# Patient Record
Sex: Male | Born: 1960
Health system: Southern US, Community
[De-identification: ages and names within clinical notes are randomized; demographics above are authoritative.]

## PROBLEM LIST (undated history)

## (undated) DIAGNOSIS — K219 Gastro-esophageal reflux disease without esophagitis: Secondary | ICD-10-CM

## (undated) DIAGNOSIS — Z889 Allergy status to unspecified drugs, medicaments and biological substances status: Secondary | ICD-10-CM

## (undated) DIAGNOSIS — Z8709 Personal history of other diseases of the respiratory system: Secondary | ICD-10-CM

## (undated) DIAGNOSIS — Z973 Presence of spectacles and contact lenses: Secondary | ICD-10-CM

## (undated) DIAGNOSIS — T7840XA Allergy, unspecified, initial encounter: Secondary | ICD-10-CM

## (undated) DIAGNOSIS — M1990 Unspecified osteoarthritis, unspecified site: Secondary | ICD-10-CM

## (undated) HISTORY — DX: Unspecified osteoarthritis, unspecified site: M19.90

## (undated) HISTORY — PX: COLONOSCOPY: SHX174

## (undated) HISTORY — DX: Allergy, unspecified, initial encounter: T78.40XA

---

## 2004-02-25 HISTORY — PX: INGUINAL HERNIA REPAIR: SUR1180

## 2004-03-08 ENCOUNTER — Ambulatory Visit (HOSPITAL_BASED_OUTPATIENT_CLINIC_OR_DEPARTMENT_OTHER): Admission: RE | Admit: 2004-03-08 | Discharge: 2004-03-08 | Payer: Self-pay | Admitting: General Surgery

## 2004-03-08 ENCOUNTER — Ambulatory Visit (HOSPITAL_COMMUNITY): Admission: RE | Admit: 2004-03-08 | Discharge: 2004-03-08 | Payer: Self-pay | Admitting: General Surgery

## 2011-06-26 ENCOUNTER — Encounter: Payer: Self-pay | Admitting: Family Medicine

## 2011-06-26 ENCOUNTER — Ambulatory Visit: Payer: Federal, State, Local not specified - PPO

## 2011-06-26 ENCOUNTER — Ambulatory Visit (INDEPENDENT_AMBULATORY_CARE_PROVIDER_SITE_OTHER): Payer: Federal, State, Local not specified - PPO | Admitting: Family Medicine

## 2011-06-26 VITALS — BP 122/83 | HR 69 | Temp 97.7°F | Resp 16 | Ht 69.5 in | Wt 173.6 lb

## 2011-06-26 DIAGNOSIS — M159 Polyosteoarthritis, unspecified: Secondary | ICD-10-CM | POA: Insufficient documentation

## 2011-06-26 DIAGNOSIS — M899 Disorder of bone, unspecified: Secondary | ICD-10-CM

## 2011-06-26 DIAGNOSIS — M898X9 Other specified disorders of bone, unspecified site: Secondary | ICD-10-CM

## 2011-06-26 DIAGNOSIS — Z0000 Encounter for general adult medical examination without abnormal findings: Secondary | ICD-10-CM

## 2011-06-26 DIAGNOSIS — Z800 Family history of malignant neoplasm of digestive organs: Secondary | ICD-10-CM | POA: Insufficient documentation

## 2011-06-26 DIAGNOSIS — Z23 Encounter for immunization: Secondary | ICD-10-CM

## 2011-06-26 DIAGNOSIS — Z13220 Encounter for screening for lipoid disorders: Secondary | ICD-10-CM

## 2011-06-26 DIAGNOSIS — J309 Allergic rhinitis, unspecified: Secondary | ICD-10-CM | POA: Insufficient documentation

## 2011-06-26 DIAGNOSIS — M949 Disorder of cartilage, unspecified: Secondary | ICD-10-CM

## 2011-06-26 DIAGNOSIS — Z8719 Personal history of other diseases of the digestive system: Secondary | ICD-10-CM | POA: Insufficient documentation

## 2011-06-26 DIAGNOSIS — K589 Irritable bowel syndrome without diarrhea: Secondary | ICD-10-CM

## 2011-06-26 LAB — POCT URINALYSIS DIPSTICK
Bilirubin, UA: NEGATIVE
Blood, UA: NEGATIVE
Glucose, UA: NEGATIVE
Leukocytes, UA: NEGATIVE
Nitrite, UA: NEGATIVE

## 2011-06-26 LAB — CBC WITH DIFFERENTIAL/PLATELET
Basophils Absolute: 0 10*3/uL (ref 0.0–0.1)
Basophils Relative: 1 % (ref 0–1)
Eosinophils Absolute: 0.1 10*3/uL (ref 0.0–0.7)
Eosinophils Relative: 1 % (ref 0–5)
HCT: 44.1 % (ref 39.0–52.0)
MCH: 31.5 pg (ref 26.0–34.0)
MCHC: 34.2 g/dL (ref 30.0–36.0)
MCV: 91.9 fL (ref 78.0–100.0)
Monocytes Absolute: 0.4 10*3/uL (ref 0.1–1.0)
Neutro Abs: 2 10*3/uL (ref 1.7–7.7)
RDW: 12.8 % (ref 11.5–15.5)

## 2011-06-26 LAB — COMPREHENSIVE METABOLIC PANEL
Albumin: 4.3 g/dL (ref 3.5–5.2)
Alkaline Phosphatase: 72 U/L (ref 39–117)
BUN: 13 mg/dL (ref 6–23)
Glucose, Bld: 99 mg/dL (ref 70–99)
Potassium: 3.8 mEq/L (ref 3.5–5.3)
Total Bilirubin: 0.3 mg/dL (ref 0.3–1.2)

## 2011-06-26 LAB — LIPID PANEL
Cholesterol: 218 mg/dL — ABNORMAL HIGH (ref 0–200)
HDL: 44 mg/dL (ref >39)
LDL Cholesterol: 158 mg/dL — ABNORMAL HIGH (ref 0–99)
Triglycerides: 80 mg/dL (ref <150)

## 2011-06-26 MED ORDER — DICLOFENAC SODIUM 75 MG PO TBEC: 75.0000 mg | DELAYED_RELEASE_TABLET | Freq: Two times a day (BID) | ORAL | Status: AC

## 2011-06-26 NOTE — Patient Instructions (Signed)
Degenerative Arthritis You have osteoarthritis. This is the wear and tear arthritis that comes with aging. It is also called degenerative arthritis. This is common in people past middle age. It is caused by stress on the joints. The large weight bearing joints of the lower extremities are most often affected. The knees, hips, back, neck, and hands can become painful, swollen, and stiff. This is the most common type of arthritis. It comes on with age, carrying too much weight, or from an injury. Treatment includes resting the sore joint until the pain and swelling improve. Crutches or a walker may be needed for severe flares. Only take over-the-counter or prescription medicines for pain, discomfort, or fever as directed by your caregiver. Local heat therapy may improve motion. Cortisone shots into the joint are sometimes used to reduce pain and swelling during flares. Osteoarthritis is usually not crippling and progresses slowly. There are things you can do to decrease pain:  Avoid high impact activities.   Exercise regularly.   Low impact exercises such as walking, biking and swimming help to keep the muscles strong and keep normal joint function.  Stretching helps to keep your range of motion.     Irritable Bowel Syndrome Irritable Bowel Syndrome (IBS) is caused by a disturbance of normal bowel function. Other terms used are spastic colon, mucous colitis, and irritable colon. It does not require surgery, nor does it lead to cancer. There is no cure for IBS. But with proper diet, stress reduction, and medication, you will find that your problems (symptoms) will gradually disappear or improve. IBS is a common digestive disorder. It usually appears in late adolescence or early adulthood. Women develop it twice as often as men. CAUSES  After food has been digested and absorbed in the small intestine, waste material is moved into the colon (large intestine). In the colon, water and salts are absorbed  from the undigested products coming from the small intestine. The remaining residue, or fecal material, is held for elimination. Under normal circumstances, gentle, rhythmic contractions on the bowel walls push the fecal material along the colon towards the rectum. In IBS, however, these contractions are irregular and poorly coordinated. The fecal material is either retained too long, resulting in constipation, or expelled too soon, producing diarrhea. SYMPTOMS  The most common symptom of IBS is pain. It is typically in the lower left side of the belly (abdomen). But it may occur anywhere in the abdomen. It can be felt as heartburn, backache, or even as a dull pain in the arms or shoulders. The pain comes from excessive bowel-muscle spasms and from the buildup of gas and fecal material in the colon. This pain:  Can range from sharp belly (abdominal) cramps to a dull, continuous ache.   Usually worsens soon after eating.   Is typically relieved by having a bowel movement or passing gas.  Abdominal pain is usually accompanied by constipation. But it may also produce diarrhea. The diarrhea typically occurs right after a meal or upon arising in the morning. The stools are typically soft and watery. They are often flecked with secretions (mucus). Other symptoms of IBS include:  Bloating.   Loss of appetite.   Heartburn.   Feeling sick to your stomach (nausea).   Belching   Vomiting   Gas.  IBS may also cause a number of symptoms that are unrelated to the digestive system:  Fatigue.   Headaches.   Anxiety   Shortness of breath   Difficulty in concentrating.  Dizziness.  These symptoms tend to come and go. DIAGNOSIS  The symptoms of IBS closely mimic the symptoms of other, more serious digestive disorders. So your caregiver may wish to perform a variety of additional tests to exclude these disorders. He/she wants to be certain of learning what is wrong (diagnosis). The nature and  purpose of each test will be explained to you. TREATMENT A number of medications are available to help correct bowel function and/or relieve bowel spasms and abdominal pain. Among the drugs available are:  Mild, non-irritating laxatives for severe constipation and to help restore normal bowel habits.   Specific anti-diarrheal medications to treat severe or prolonged diarrhea.   Anti-spasmodic agents to relieve intestinal cramps.   Your caregiver may also decide to treat you with a mild tranquilizer or sedative during unusually stressful periods in your life.  The important thing to remember is that if any drug is prescribed for you, make sure that you take it exactly as directed. Make sure that your caregiver knows how well it worked for you. HOME CARE INSTRUCTIONS   Avoid foods that are high in fat or oils. Some examples ZOX:WRUEA cream, butter, frankfurters, sausage, and other fatty meats.   Avoid foods that have a laxative effect, such as fruit, fruit juice, and dairy products.   Cut out carbonated drinks, chewing gum, and "gassy" foods, such as beans and cabbage. This may help relieve bloating and belching.   Bran taken with plenty of liquids may help relieve constipation.   Keep track of what foods seem to trigger your symptoms.   Avoid emotionally charged situations or circumstances that produce anxiety.   Start or continue exercising.   Get plenty of rest and sleep.  MAKE SURE YOU:   Understand these instructions.   Will watch your condition.   Will get help right away if you are not doing well or get worse.  Document Released: 02/10/2005 Document Revised: 01/30/2011 Document Reviewed: 10/01/2007  [facility] Patient Information 2012 [place], [place].  Lose weight if you are overweight. This reduces joint stress.  In severe cases when you have pain at rest or increasing disability, joint surgery may be helpful. See your caregiver for follow-up treatment as recommended.    SEEK IMMEDIATE MEDICAL CARE IF:   You have severe joint pain.   Marked swelling and redness in your joint develops.   You develop a high fever.  Document Released: 02/10/2005 Document Revised: 01/30/2011 Document Reviewed: 07/13/2006 [facility] Patient Information 2012 [place], [place]     .Keeping you healthy  Get these tests  Blood pressure- Have your blood pressure checked once a year by your healthcare provider.  Normal blood pressure is 120/80.  Weight- Have your body mass index (BMI) calculated to screen for obesity.  BMI is a measure of body fat based on height and weight. You can also calculate your own BMI at https://www.west-esparza.com/.  Cholesterol- Have your cholesterol checked regularly starting at age 43, sooner may be necessary if you have diabetes, high blood pressure, if a family member developed heart diseases at an early age or if you smoke.   Chlamydia, HIV, and other sexual transmitted disease- Get screened each year until the age of 30 then within three months of each new sexual partner.  Diabetes- Have your blood sugar checked regularly if you have high blood pressure, high cholesterol, a family history of diabetes or if you are overweight.  Get these vaccines  Flu shot- Every fall.  Tetanus shot- Every 10 years.  Menactra- Single dose; prevents meningitis.  Take these steps  Don't smoke- If you do smoke, ask your healthcare provider about quitting. For tips on how to quit, go to www.smokefree.gov or call 1-800-QUIT-NOW.  Be physically active- Exercise 5 days a week for at least 30 minutes.  If you are not already physically active start slow and gradually work up to 30 minutes of moderate physical activity.  Examples of moderate activity include walking briskly, mowing the yard, dancing, swimming bicycling, etc.  Eat a healthy diet- Eat a variety of healthy foods such as fruits, vegetables, low fat milk, low fat cheese, yogurt, lean meats, poultry, fish,  beans, tofu, etc.  For more information on healthy eating, go to www.thenutritionsource.org  Drink alcohol in moderation- Limit alcohol intake two drinks or less a day.  Never drink and drive.  Dentist- Brush and floss teeth twice daily; visit your dentis twice a year.  Depression-Your emotional health is as important as your physical health.  If you're feeling down, losing interest in things you normally enjoy please talk with your healthcare provider.  Gun Safety- If you keep a gun in your home, keep it unloaded and with the safety lock on.  Bullets should be stored separately.  Helmet use- Always wear a helmet when riding a motorcycle, bicycle, rollerblading or skateboarding.  Safe sex- If you may be exposed to a sexually transmitted infection, use a condom  Seat belts- Seat bels can save your life; always wear one.  Smoke/Carbon Monoxide detectors- These detectors need to be installed on the appropriate level of your home.  Replace batteries at least once a year.  Skin Cancer- When out in the sun, cover up and use sunscreen SPF 15 or higher.  Violence- If anyone is threatening or hurting you, please tell your healthcare provider.

## 2011-06-26 NOTE — Progress Notes (Signed)
Subjective:    Patient ID: Don Hurst, male    DOB: 1961-01-20, 51 y.o.   MRN: [id number]  HPI  This 51 y.o. AA male is here with his wife for CPE/ wellness exam. He is a Paramedic, on his feet   during work hours and has bilateral knee pain (right >left) for years. Supportive footwear not really helpful;  also has mild LBP and right ankle pain, worse with weight-bearing. Right knee pain started in 1988 after  an injury- pops and catches without swelling; xray done 20 years ago was unremarkable. NSAID prescribed  at that time not effective. Chiropractic treatment for 2 months for back pain helps temporarily. He has  preauricular nodules that come and go for > 10 years; his adult daughter has the same problem.        He has IBS diagnosed about 5 years ago; he was seen by Dr. Loreta Ave and referred to another practice  for CRS but pt never had procedure done. He has daily soft stools (no BRBPR) with occasional cramping; known   to be lactose-intolerant. Father died of colon cancer at age 106.   Review of Systems  Constitutional: Negative.   HENT: Positive for rhinorrhea and sneezing. Negative for ear pain, congestion, mouth sores, trouble swallowing, neck pain, neck stiffness, dental problem, sinus pressure and ear discharge.   Eyes: Negative for pain, redness and visual disturbance.  Respiratory: Negative for cough, chest tightness and shortness of breath.   Gastrointestinal: Positive for diarrhea. Negative for nausea, vomiting, abdominal pain, constipation, blood in stool and anal bleeding.  Genitourinary: Negative.   Musculoskeletal: Positive for arthralgias.  Skin: Negative.   Neurological: Negative.   Hematological: Negative.   Psychiatric/Behavioral: Negative.        Objective:   Physical Exam  Nursing note and vitals reviewed. Constitutional: He is oriented to person, place, and time. He appears well-developed and well-nourished. No distress.  HENT:  Head: Normocephalic and  atraumatic.  Right Ear: External ear normal.  Nose: Nose normal.  Mouth/Throat: Oropharynx is clear and moist.       Firm, mobile 1.5 cm nodule in preauricular area of left ear  Eyes: Conjunctivae and EOM are normal. Pupils are equal, round, and reactive to light. No scleral icterus.  Neck: Normal range of motion. Neck supple. No thyromegaly present.  Cardiovascular: Normal rate, regular rhythm, normal heart sounds and intact distal pulses.  Exam reveals no gallop and no friction rub.   No murmur heard. Pulmonary/Chest: Effort normal and breath sounds normal. No respiratory distress. He has no wheezes.  Abdominal: Soft. Bowel sounds are normal. He exhibits no distension and no mass. There is no rebound and no guarding. Hernia confirmed negative in the right inguinal area and confirmed negative in the left inguinal area.       Mildly tender in LLQ  Genitourinary: Prostate normal, testes normal and penis normal. Guaiac negative stool.       Increased sphincter tone made DRE difficult  Musculoskeletal: He exhibits no edema and no tenderness.       Knees: bilateral crepitus (audible); good ROM without redness, warmth or swelling. No ligament laxity, McMurray's sign negative Back exam: SLR negative; forward flexion to 60 degrees  Lymphadenopathy:    He has no cervical adenopathy.       Right: No inguinal adenopathy present.       Left: No inguinal adenopathy present.  Neurological: He is alert and oriented to person, place, and time. He has normal  reflexes. No cranial nerve deficit. He exhibits normal muscle tone. Coordination normal.  Skin: Skin is warm and dry.  Psychiatric: He has a normal mood and affect. His behavior is normal. Judgment and thought content normal.    [facility] reading (PRIMARY) by  Dr. Audria Nine:  Minimal degenerative changes;no fracture, no dislocation       Assessment & Plan:   1. Routine general medical examination at a health care facility  Comprehensive metabolic  panel, CBC with Differential  2. IBS (irritable bowel syndrome)  Ambulatory referral to Gastroenterology Advised Anti-Inflammatory Diet  3. Bone pain (Knee pain) Vitamin D, 25-hydroxy, DG Knee 1-2 Views Left, Ambulatory referral to Orthopedic Surgery RX: Diclofenac 75 mg 1 tablet twice daily with meals  4. Screening for hyperlipidemia  Lipid panel  5. Need for prophylactic vaccination with combined diphtheria-tetanus-pertussis (DTP) vaccine  Tdap vaccine greater than or equal to 7yo IM

## 2011-06-29 NOTE — Progress Notes (Signed)
Quick Note:  Please call pt and advise that the following labs are abnormal...   Your cholesterol - Total and LDL ("bad")- are above normal. Healthier eating habits can improve these numbers. Avoid fried foods, processed foods, eat less red meat and remove the skin from poultry before eating.  Eat low-fat dairy products and more fruits and vegetables and whole grains. Exercise regularly. Omega 3 Fish Oil 1200 mg 1 capsule daily can help reduce your cholesterol. The Anti- Inflammatory diet that I gave you includes   these foods.  These numbers should be recheck in 3-4 months.  Copy to pt. ______

## 2012-06-09 ENCOUNTER — Encounter: Payer: Self-pay | Admitting: Family Medicine

## 2012-06-09 ENCOUNTER — Ambulatory Visit (INDEPENDENT_AMBULATORY_CARE_PROVIDER_SITE_OTHER): Payer: Federal, State, Local not specified - PPO | Admitting: Family Medicine

## 2012-06-09 VITALS — BP 100/60 | HR 77 | Temp 98.0°F | Resp 16 | Ht 70.0 in | Wt 169.4 lb

## 2012-06-09 DIAGNOSIS — J309 Allergic rhinitis, unspecified: Secondary | ICD-10-CM

## 2012-06-09 DIAGNOSIS — R220 Localized swelling, mass and lump, head: Secondary | ICD-10-CM

## 2012-06-09 NOTE — Patient Instructions (Signed)
I am referring you to ENT (Ear, Nose & Throat Specialist) to evaluate the mass in front of your left ear. A staff person will contact you with the specifics.

## 2012-06-10 NOTE — Progress Notes (Signed)
S: This 52 y.o. AA male is here for eval of mass located in front of L ear, present for < 1 year and increasing in size. There is no pain or drainage associated w/ this mass. Pt denies fever/ chills, dental problems, ear pain or drainage, pain w/ chewing difficulty swallowing, neck pain, cough or SOB, HA or dizziness. He does have seasonal allergies, relieved w/  OTC generic Zyrtec.He has no severe eye symptoms , hoarseness, wheezing or rashes with these allergies.  ROS: As per HPI.  O:  Filed Vitals:   06/09/12 1013  BP: 100/60  Pulse: 77  Temp: 98 F (36.7 C)  Resp: 16   GEN: In NAD; WN,WD. HEENT: [place]/AT: EOMI w/ clear conj/ sclerae. EACs/TMs normal. Nasal mucosa erythematous and red. Post ph clear w/ minor cobblestonig and redness.               1.5 cm firm slightly mobile mass anterior to L ear, in area of parotid gland. NECK: Supple w/o LAN or TMG. COR: RRR. No m/g/r. LUNGS:CTA. Normal resp rate and effort. SKIN: W&D; no rashes or erythema. NEURO: A&O x 3; Cns intact. Nonfocal.  A/P: Mass of preauricular region - suspect parotid gland abnormality.   Plan: Ambulatory referral to ENT  Allergic rhinitis, cause unspecified- continue current medications. Advised other avoidance measures.

## 2012-06-14 ENCOUNTER — Other Ambulatory Visit: Payer: Self-pay | Admitting: Otolaryngology

## 2012-06-14 ENCOUNTER — Other Ambulatory Visit (HOSPITAL_COMMUNITY)
Admission: RE | Admit: 2012-06-14 | Discharge: 2012-06-14 | Disposition: A | Discharge status: Home / Self Care | Payer: Federal, State, Local not specified - PPO | Source: Ambulatory Visit | Attending: Otolaryngology | Admitting: Otolaryngology

## 2012-06-14 DIAGNOSIS — K119 Disease of salivary gland, unspecified: Secondary | ICD-10-CM | POA: Insufficient documentation

## 2014-02-06 ENCOUNTER — Encounter (HOSPITAL_BASED_OUTPATIENT_CLINIC_OR_DEPARTMENT_OTHER): Payer: Self-pay | Admitting: *Deleted

## 2014-02-06 NOTE — Progress Notes (Signed)
No labs needed To bring meds and overnight bag

## 2014-02-07 ENCOUNTER — Other Ambulatory Visit: Payer: Self-pay | Admitting: Otolaryngology

## 2014-02-07 NOTE — H&P (Signed)
Don Hurst, Don Hurst 53 y.o., male [id number]     Chief Complaint: LEFT parotid mass  HPI: 53 year old black male comes in noting a lump in front of his left ear which has been slowly larger for the past 1+ years.  His primary doctor thought it might be a cyst.  It does not swell with meals.  He has never been infected.  No trismus.  No other neck masses.  No masses in axillae or groins.  No fevers or night sweats.  No change in weight, appetite, or energy.  No prior similar events.  No lesions or infections in the temporal scalp or face including pimples, rashes, or insect bites.  No known skin conditions.  No ear drainage or change in hearing.  He did have a broken tooth extracted one month ago including some infection, but the mass predates this event.  We received the pathology report from his fine needle aspiration.  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  We will call and share this with him.  I would recommend a superficial parotidectomy with facial nerve preservation.  We discussed this in preliminary detail when I saw him in the office earlier this week.  Recheck here preoperatively.  18 months return visit for this now 53 year old black male.  He has had a slowly growing mass in his LEFT parotid.  Needle aspiration last time was consistent with pleomorphic adenoma.  It is larger, more visible, and with occasional mild twinges of pain.  No facial weakness.  No other lumps in his neck on either side.  One-week recheck.  We are preparing for a LEFT superficial parotidectomy with facial nerve preservation next week.  I discussed the surgery with him in detail including risks and complications.  Questions were answered and informed consent was obtained.  I discussed wound care, and advancement of diet and activity including return to work.  I will have him use chlorhexidine scrub prior to surgery.  I am giving him a prescription for hydrocodone for pain relief for use  after surgery.   He has brought his [place] forms for [place] to fill out.   PMH: Past Medical History  Diagnosis Date  . Asthma   . Allergy   . Arthritis   . Wears glasses   . Multiple allergies     Surg Hx: Past Surgical History  Procedure Laterality Date  . Inguinal hernia repair  02/2004    rt  . Colonoscopy      FHx:   Family History  Problem Relation Age of Onset  . Hypertension Mother     per pt dx around 73 y o  . Cancer Father 16    colon  . Eczema Daughter   . Hypertension Sister    SocHx:  reports that he quit smoking about 22 years ago. His smoking use included Cigarettes. He smoked 0.00 packs per day for 4 years. He does not have any smokeless tobacco history on file. He reports that he does not drink alcohol or use illicit drugs.  ALLERGIES: No Known Allergies   (Not in a hospital admission)  No results found for this or any previous visit (from the past 48 hour(s)). No results found.  ZYS:AYTKZSWF: Not feeling tired (fatigue).  No fever, no night sweats, and no recent weight loss. Head: No headache. Eyes: No eye symptoms. Otolaryngeal: No hearing loss, no earache, no tinnitus, and no purulent nasal discharge.  No nasal passage blockage (stuffiness),  no snoring, no sneezing, no hoarseness, and no sore throat. Cardiovascular: No chest pain or discomfort  and no palpitations. Pulmonary: No dyspnea, no cough, and no wheezing. Gastrointestinal: No dysphagia  and no heartburn.  No nausea, no abdominal pain, and no melena.  No diarrhea. Genitourinary: No dysuria. Endocrine: No muscle weakness. Musculoskeletal: No calf muscle cramps, no arthralgias, and no soft tissue swelling. Neurological: No dizziness, no fainting, no tingling, and no numbness. Psychological: No anxiety  and no depression. Skin: No rash.   BP:102/63,  HR: 73 b/min,  Height: 70.5 in, Weight: 172 lb, BMI: 24.3 kg/m2,   PHYSICAL EXAM: He has a prominent 2 cm mobile rubbery LEFT parotid mass  in the mid pretragal region.  Facial nerve is entirely intact both sides.   Lungs: Clear to auscultation Heart: Regular rate and rhythm without murmurs Abdomen: Soft, active Extremities: Normal configuration Neurologic: Symmetric, grossly intact.  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  We need to remove part of the saliva gland which contains the tumor on the LEFT side.  This takes about 2 hours.  You will be overnight in the hospital with [place] one night.  No strenuous activity for 2 full weeks afterwards.  The needle test last year suggested this was benign, and this is still likely to be the case.  I will see you here right before surgery.  No aspirin containing compounds for 10 days prior to surgery please. Buy some chlorhexidine (Hibiclens) scrub soap at the drug store and shower with it, emphasis on your LEFT face and neck, the night before surgery and again the morning of surgery.  Lortab 10-325 MG Oral Tablet;1/2 - 1 po q4-6h prn pain; FSF42; R0; Rx.  Don Hurst, Don Hurst 39/53/2023, 1:18 PM

## 2014-02-08 ENCOUNTER — Ambulatory Visit (HOSPITAL_BASED_OUTPATIENT_CLINIC_OR_DEPARTMENT_OTHER): Payer: Federal, State, Local not specified - PPO | Admitting: Certified Registered"

## 2014-02-08 ENCOUNTER — Ambulatory Visit (HOSPITAL_BASED_OUTPATIENT_CLINIC_OR_DEPARTMENT_OTHER)
Admission: RE | Admit: 2014-02-08 | Discharge: 2014-02-09 | Disposition: A | Discharge status: Home / Self Care | Payer: Federal, State, Local not specified - PPO | Source: Ambulatory Visit | Attending: Otolaryngology | Admitting: Otolaryngology

## 2014-02-08 ENCOUNTER — Encounter (HOSPITAL_BASED_OUTPATIENT_CLINIC_OR_DEPARTMENT_OTHER)
Admission: RE | Disposition: A | Discharge status: Home / Self Care | Payer: Self-pay | Source: Ambulatory Visit | Attending: Otolaryngology

## 2014-02-08 ENCOUNTER — Encounter (HOSPITAL_BASED_OUTPATIENT_CLINIC_OR_DEPARTMENT_OTHER): Payer: Self-pay | Admitting: *Deleted

## 2014-02-08 DIAGNOSIS — D110 Benign neoplasm of parotid gland: Secondary | ICD-10-CM | POA: Insufficient documentation

## 2014-02-08 DIAGNOSIS — Z87891 Personal history of nicotine dependence: Secondary | ICD-10-CM | POA: Diagnosis not present

## 2014-02-08 DIAGNOSIS — J45909 Unspecified asthma, uncomplicated: Secondary | ICD-10-CM | POA: Insufficient documentation

## 2014-02-08 DIAGNOSIS — M1990 Unspecified osteoarthritis, unspecified site: Secondary | ICD-10-CM | POA: Diagnosis not present

## 2014-02-08 DIAGNOSIS — D37030 Neoplasm of uncertain behavior of the parotid salivary glands: Secondary | ICD-10-CM | POA: Diagnosis present

## 2014-02-08 HISTORY — PX: PAROTIDECTOMY: SHX2163

## 2014-02-08 HISTORY — DX: Presence of spectacles and contact lenses: Z97.3

## 2014-02-08 HISTORY — DX: Allergy status to unspecified drugs, medicaments and biological substances: Z88.9

## 2014-02-08 LAB — POCT HEMOGLOBIN-HEMACUE: HEMOGLOBIN: 14.9 g/dL (ref 13.0–17.0)

## 2014-02-08 SURGERY — EXCISION, PAROTID GLAND
Anesthesia: General | Site: Face | Laterality: Left

## 2014-02-08 MED ORDER — SUFENTANIL CITRATE 50 MCG/ML IV SOLN
INTRAVENOUS | Status: DC | PRN
  Administered 2014-02-08: 5 ug via INTRAVENOUS
  Administered 2014-02-08: 20 ug via INTRAVENOUS

## 2014-02-08 MED ORDER — ALBUTEROL SULFATE HFA 108 (90 BASE) MCG/ACT IN AERS
2.0000 | INHALATION_SPRAY | RESPIRATORY_TRACT | Status: AC
  Administered 2014-02-08: 2 via RESPIRATORY_TRACT

## 2014-02-08 MED ORDER — CHLORHEXIDINE GLUCONATE 4 % EX LIQD: 1.0000 "application " | Freq: Once | CUTANEOUS | Status: DC

## 2014-02-08 MED ORDER — LACTATED RINGERS IV SOLN
INTRAVENOUS | Status: DC
  Administered 2014-02-08 (×2): via INTRAVENOUS

## 2014-02-08 MED ORDER — HYDROMORPHONE HCL 1 MG/ML IJ SOLN: 0.2500 mg | INTRAMUSCULAR | Status: DC | PRN

## 2014-02-08 MED ORDER — DEXTROSE-NACL 5-0.45 % IV SOLN
INTRAVENOUS | Status: DC
  Administered 2014-02-08: 16:00:00 via INTRAVENOUS

## 2014-02-08 MED ORDER — BACITRACIN ZINC 500 UNIT/GM EX OINT
1.0000 "application " | TOPICAL_OINTMENT | Freq: Three times a day (TID) | CUTANEOUS | Status: DC
  Administered 2014-02-08: 1 via TOPICAL

## 2014-02-08 MED ORDER — PHENYLEPHRINE HCL 10 MG/ML IJ SOLN
10.0000 mg | INTRAVENOUS | Status: DC | PRN
  Administered 2014-02-08: 50 ug/min via INTRAVENOUS

## 2014-02-08 MED ORDER — CEFAZOLIN SODIUM-DEXTROSE 2-3 GM-% IV SOLR
INTRAVENOUS | Status: AC
  Filled 2014-02-08: qty 50

## 2014-02-08 MED ORDER — LIDOCAINE HCL (CARDIAC) 20 MG/ML IV SOLN
INTRAVENOUS | Status: DC | PRN
  Administered 2014-02-08: 40 mg via INTRAVENOUS

## 2014-02-08 MED ORDER — ONDANSETRON HCL 4 MG/2ML IJ SOLN
INTRAMUSCULAR | Status: DC | PRN
  Administered 2014-02-08: 4 mg via INTRAVENOUS

## 2014-02-08 MED ORDER — DEXAMETHASONE SODIUM PHOSPHATE 4 MG/ML IJ SOLN
INTRAMUSCULAR | Status: DC | PRN
  Administered 2014-02-08: 10 mg via INTRAVENOUS

## 2014-02-08 MED ORDER — ALBUTEROL SULFATE (2.5 MG/3ML) 0.083% IN NEBU: 2.5000 mg | INHALATION_SOLUTION | Freq: Four times a day (QID) | RESPIRATORY_TRACT | Status: DC | PRN

## 2014-02-08 MED ORDER — ALBUTEROL SULFATE HFA 108 (90 BASE) MCG/ACT IN AERS
INHALATION_SPRAY | RESPIRATORY_TRACT | Status: AC
  Filled 2014-02-08: qty 6.7

## 2014-02-08 MED ORDER — SUCCINYLCHOLINE CHLORIDE 20 MG/ML IJ SOLN
INTRAMUSCULAR | Status: DC | PRN
  Administered 2014-02-08: 100 mg via INTRAVENOUS

## 2014-02-08 MED ORDER — PHENYLEPHRINE HCL 10 MG/ML IJ SOLN
INTRAMUSCULAR | Status: DC | PRN
  Administered 2014-02-08 (×2): 40 ug via INTRAVENOUS

## 2014-02-08 MED ORDER — MIDAZOLAM HCL 2 MG/2ML IJ SOLN: 1.0000 mg | INTRAMUSCULAR | Status: DC | PRN

## 2014-02-08 MED ORDER — OXYCODONE HCL 5 MG PO TABS: 5.0000 mg | ORAL_TABLET | Freq: Once | ORAL | Status: AC | PRN

## 2014-02-08 MED ORDER — MIDAZOLAM HCL 2 MG/2ML IJ SOLN
INTRAMUSCULAR | Status: AC
  Filled 2014-02-08: qty 2

## 2014-02-08 MED ORDER — ALBUTEROL SULFATE HFA 108 (90 BASE) MCG/ACT IN AERS
INHALATION_SPRAY | RESPIRATORY_TRACT | Status: DC | PRN
  Administered 2014-02-08: 2 via RESPIRATORY_TRACT

## 2014-02-08 MED ORDER — HYDROCODONE-ACETAMINOPHEN 5-325 MG PO TABS
1.0000 | ORAL_TABLET | ORAL | Status: DC | PRN
  Administered 2014-02-09: 1 via ORAL
  Filled 2014-02-08: qty 1

## 2014-02-08 MED ORDER — LIDOCAINE-EPINEPHRINE 1 %-1:100000 IJ SOLN
INTRAMUSCULAR | Status: DC | PRN
  Administered 2014-02-08: 8 mL

## 2014-02-08 MED ORDER — PROPOFOL 10 MG/ML IV BOLUS
INTRAVENOUS | Status: DC | PRN
  Administered 2014-02-08: 200 mg via INTRAVENOUS

## 2014-02-08 MED ORDER — BACITRACIN ZINC 500 UNIT/GM EX OINT
TOPICAL_OINTMENT | CUTANEOUS | Status: AC
  Filled 2014-02-08: qty 28.35

## 2014-02-08 MED ORDER — OXYCODONE-ACETAMINOPHEN 5-325 MG PO TABS: 1.0000 | ORAL_TABLET | ORAL | Status: DC | PRN

## 2014-02-08 MED ORDER — ONDANSETRON HCL 4 MG PO TABS
4.0000 mg | ORAL_TABLET | ORAL | Status: DC | PRN
Start: 2014-02-08 — End: 2014-02-09

## 2014-02-08 MED ORDER — OXYCODONE HCL 5 MG/5ML PO SOLN
5.0000 mg | Freq: Once | ORAL | Status: AC | PRN
Start: 2014-02-08 — End: 2014-02-08

## 2014-02-08 MED ORDER — FENTANYL CITRATE 0.05 MG/ML IJ SOLN: 50.0000 ug | INTRAMUSCULAR | Status: DC | PRN

## 2014-02-08 MED ORDER — MIDAZOLAM HCL 5 MG/5ML IJ SOLN
INTRAMUSCULAR | Status: DC | PRN
  Administered 2014-02-08: 2 mg via INTRAVENOUS

## 2014-02-08 MED ORDER — LIDOCAINE-EPINEPHRINE 1 %-1:100000 IJ SOLN
INTRAMUSCULAR | Status: AC
  Filled 2014-02-08: qty 1

## 2014-02-08 MED ORDER — ONDANSETRON HCL 4 MG/2ML IJ SOLN: 4.0000 mg | INTRAMUSCULAR | Status: DC | PRN

## 2014-02-08 MED ORDER — SUFENTANIL CITRATE 50 MCG/ML IV SOLN
INTRAVENOUS | Status: AC
  Filled 2014-02-08: qty 1

## 2014-02-08 MED ORDER — BACITRACIN ZINC 500 UNIT/GM EX OINT
TOPICAL_OINTMENT | CUTANEOUS | Status: AC
  Filled 2014-02-08: qty 1.8

## 2014-02-08 MED ORDER — CEFAZOLIN SODIUM-DEXTROSE 2-3 GM-% IV SOLR
2.0000 g | Freq: Once | INTRAVENOUS | Status: AC
  Administered 2014-02-08: 2 g via INTRAVENOUS

## 2014-02-08 SURGICAL SUPPLY — 82 items
APL SKNCLS STERI-STRIP NONHPOA (GAUZE/BANDAGES/DRESSINGS)
APPLICATOR COTTON TIP 6IN STRL (MISCELLANEOUS) ×2 IMPLANT
ATTRACTOMAT 16X20 MAGNETIC DRP (DRAPES) ×2 IMPLANT
BENZOIN TINCTURE PRP APPL 2/3 (GAUZE/BANDAGES/DRESSINGS) IMPLANT
BLADE SCALPEL THERMAL 15 (MISCELLANEOUS) ×4 IMPLANT
BLADE SURG 12 STRL SS (BLADE) IMPLANT
BLADE SURG 15 STRL LF DISP TIS (BLADE) ×1 IMPLANT
BLADE SURG 15 STRL SS (BLADE) ×4
BNDG CONFORM 3 STRL LF (GAUZE/BANDAGES/DRESSINGS) IMPLANT
BNDG GAUZE ELAST 4 BULKY (GAUZE/BANDAGES/DRESSINGS) IMPLANT
CANISTER SUCT 1200ML W/VALVE (MISCELLANEOUS) ×2 IMPLANT
CLEANER CAUTERY TIP 5X5 PAD (MISCELLANEOUS) ×1 IMPLANT
CORDS BIPOLAR (ELECTRODE) ×2 IMPLANT
COVER BACK TABLE 60X90IN (DRAPES) ×2 IMPLANT
COVER MAYO STAND STRL (DRAPES) ×2 IMPLANT
DECANTER SPIKE VIAL GLASS SM (MISCELLANEOUS) ×2 IMPLANT
DRAIN CHANNEL 7F FF FLAT (WOUND CARE) IMPLANT
DRAIN JACKSON RD 7FR 3/32 (WOUND CARE) IMPLANT
DRAIN JP 10F RND SILICONE (MISCELLANEOUS) ×1 IMPLANT
DRAIN PENROSE 1/4X12 LTX STRL (WOUND CARE) IMPLANT
DRAIN SNY WOU 7FLT (WOUND CARE) IMPLANT
DRAIN TLS ROUND 10FR (DRAIN) IMPLANT
DRAPE INCISE 23X17 IOBAN STRL (DRAPES) ×1
DRAPE INCISE 23X17 STRL (DRAPES) ×1 IMPLANT
DRAPE INCISE IOBAN 23X17 STRL (DRAPES) ×1 IMPLANT
DRAPE U-SHAPE 76X120 STRL (DRAPES) ×2 IMPLANT
ELECT COATED BLADE 2.86 ST (ELECTRODE) ×2 IMPLANT
ELECT PAIRED SUBDERMAL (MISCELLANEOUS) ×2
ELECT REM PT RETURN 9FT ADLT (ELECTROSURGICAL) ×2
ELECTRODE PAIRED SUBDERMAL (MISCELLANEOUS) IMPLANT
ELECTRODE REM PT RTRN 9FT ADLT (ELECTROSURGICAL) ×1 IMPLANT
EVACUATOR 3/16  PVC DRAIN (DRAIN)
EVACUATOR 3/16 PVC DRAIN (DRAIN) IMPLANT
EVACUATOR SILICONE 100CC (DRAIN) ×1 IMPLANT
GAUZE SPONGE 4X4 16PLY XRAY LF (GAUZE/BANDAGES/DRESSINGS) ×1 IMPLANT
GLOVE BIO SURGEON STRL SZ 6.5 (GLOVE) ×3 IMPLANT
GLOVE BIO SURGEON STRL SZ7.5 (GLOVE) IMPLANT
GLOVE BIOGEL PI IND STRL 7.0 (GLOVE) IMPLANT
GLOVE BIOGEL PI IND STRL 8 (GLOVE) IMPLANT
GLOVE BIOGEL PI INDICATOR 7.0 (GLOVE) ×1
GLOVE BIOGEL PI INDICATOR 8 (GLOVE)
GLOVE ECLIPSE 6.5 STRL STRAW (GLOVE) ×1 IMPLANT
GLOVE ECLIPSE 7.5 STRL STRAW (GLOVE) IMPLANT
GLOVE ECLIPSE 8.0 STRL XLNG CF (GLOVE) ×2 IMPLANT
GLOVE SS BIOGEL STRL SZ 7.5 (GLOVE) IMPLANT
GLOVE SUPERSENSE BIOGEL SZ 7.5 (GLOVE)
GOWN STRL REUS W/ TWL LRG LVL3 (GOWN DISPOSABLE) ×1 IMPLANT
GOWN STRL REUS W/ TWL XL LVL3 (GOWN DISPOSABLE) ×1 IMPLANT
GOWN STRL REUS W/TWL LRG LVL3 (GOWN DISPOSABLE) ×6
GOWN STRL REUS W/TWL XL LVL3 (GOWN DISPOSABLE) ×2
LIQUID BAND (GAUZE/BANDAGES/DRESSINGS) IMPLANT
LOCATOR NERVE 3 VOLT (DISPOSABLE) ×1 IMPLANT
NDL HYPO 25X1 1.5 SAFETY (NEEDLE) ×1 IMPLANT
NEEDLE HYPO 25X1 1.5 SAFETY (NEEDLE) ×2 IMPLANT
NS IRRIG 1000ML POUR BTL (IV SOLUTION) ×2 IMPLANT
PACK BASIN DAY SURGERY FS (CUSTOM PROCEDURE TRAY) ×2 IMPLANT
PAD CLEANER CAUTERY TIP 5X5 (MISCELLANEOUS) ×1
PENCIL BUTTON HOLSTER BLD 10FT (ELECTRODE) ×2 IMPLANT
PIN SAFETY STERILE (MISCELLANEOUS) IMPLANT
PROBE NERVBE PRASS .33 (MISCELLANEOUS) ×1 IMPLANT
SHEET MEDIUM DRAPE 40X70 STRL (DRAPES) IMPLANT
SLEEVE SCD COMPRESS KNEE MED (MISCELLANEOUS) ×2 IMPLANT
SPONGE GAUZE 4X4 12PLY STER LF (GAUZE/BANDAGES/DRESSINGS) IMPLANT
STAPLER VISISTAT 35W (STAPLE) ×2 IMPLANT
STRIP CLOSURE SKIN 1/4X4 (GAUZE/BANDAGES/DRESSINGS) IMPLANT
SUCTION FRAZIER TIP 10 FR DISP (SUCTIONS) IMPLANT
SUT CHROMIC 4 0 PS 2 18 (SUTURE) ×4 IMPLANT
SUT ETHILON 3 0 PS 1 (SUTURE) ×2 IMPLANT
SUT ETHILON 5 0 P 3 18 (SUTURE) ×1
SUT NYLON ETHILON 5-0 P-3 1X18 (SUTURE) IMPLANT
SUT SILK 2 0 FS (SUTURE) ×4 IMPLANT
SUT SILK 2 0 TIES 17X18 (SUTURE)
SUT SILK 2-0 18XBRD TIE BLK (SUTURE) IMPLANT
SUT SILK 3 0 TIES 17X18 (SUTURE) ×2
SUT SILK 3-0 18XBRD TIE BLK (SUTURE) ×1 IMPLANT
SUT SILK 4 0 TIES 17X18 (SUTURE) IMPLANT
SYR BULB 3OZ (MISCELLANEOUS) ×2 IMPLANT
SYR CONTROL 10ML LL (SYRINGE) ×2 IMPLANT
TOWEL OR 17X24 6PK STRL BLUE (TOWEL DISPOSABLE) ×4 IMPLANT
TRAY DSU PREP LF (CUSTOM PROCEDURE TRAY) ×2 IMPLANT
TUBE CONNECTING 20X1/4 (TUBING) ×2 IMPLANT
YANKAUER SUCT BULB TIP NO VENT (SUCTIONS) ×1 IMPLANT

## 2014-02-08 NOTE — Interval H&P Note (Signed)
History and Physical Interval Note:  02/08/2014 9:59 AM  Don Hurst  has presented today for surgery, with the diagnosis of LEFT PAROTID NEOPLASM  The various methods of treatment have been discussed with the patient and family. After consideration of risks, benefits and other options for treatment, the patient has consented to  Procedure(s): LEFT SUPERFICIAL PAROTIDECTOMY (Left) as a surgical intervention .  The patient's history has been re-reviewed, patient re-examined, no change in status, stable for surgery.  I have re-reviewed the patient's chart and labs.  Questions were answered to the patient's satisfaction.     Jodi Marble

## 2014-02-08 NOTE — H&P (View-Only) (Signed)
Don Hurst, Don Hurst 53 y.o., male [id number]     Chief Complaint: LEFT parotid mass  HPI: 53 year old black male comes in noting a lump in front of his left ear which has been slowly larger for the past 1+ years.  His primary doctor thought it might be a cyst.  It does not swell with meals.  He has never been infected.  No trismus.  No other neck masses.  No masses in axillae or groins.  No fevers or night sweats.  No change in weight, appetite, or energy.  No prior similar events.  No lesions or infections in the temporal scalp or face including pimples, rashes, or insect bites.  No known skin conditions.  No ear drainage or change in hearing.  He did have a broken tooth extracted one month ago including some infection, but the mass predates this event.  We received the pathology report from his fine needle aspiration.  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  We will call and share this with him.  I would recommend a superficial parotidectomy with facial nerve preservation.  We discussed this in preliminary detail when I saw him in the office earlier this week.  Recheck here preoperatively.  18 months return visit for this now 53 year old black male.  He has had a slowly growing mass in his LEFT parotid.  Needle aspiration last time was consistent with pleomorphic adenoma.  It is larger, more visible, and with occasional mild twinges of pain.  No facial weakness.  No other lumps in his neck on either side.  One-week recheck.  We are preparing for a LEFT superficial parotidectomy with facial nerve preservation next week.  I discussed the surgery with him in detail including risks and complications.  Questions were answered and informed consent was obtained.  I discussed wound care, and advancement of diet and activity including return to work.  I will have him use chlorhexidine scrub prior to surgery.  I am giving him a prescription for hydrocodone for pain relief for use  after surgery.   He has brought his FM [place] forms for [place] to fill out.   PMH: Past Medical History  Diagnosis Date  . Asthma   . Allergy   . Arthritis   . Wears glasses   . Multiple allergies     Surg Hx: Past Surgical History  Procedure Laterality Date  . Inguinal hernia repair  02/2004    rt  . Colonoscopy      FHx:   Family History  Problem Relation Age of Onset  . Hypertension Mother     per pt dx around 17 y o  . Cancer Father 73    colon  . Eczema Daughter   . Hypertension Sister    SocHx:  reports that he quit smoking about 22 years ago. His smoking use included Cigarettes. He smoked 0.00 packs per day for 4 years. He does not have any smokeless tobacco history on file. He reports that he does not drink alcohol or use illicit drugs.  ALLERGIES: No Known Allergies   (Not in a hospital admission)  No results found for this or any previous visit (from the past 48 hour(s)). No results found.  KNL:ZJQBHALP: Not feeling tired (fatigue).  No fever, no night sweats, and no recent weight loss. Head: No headache. Eyes: No eye symptoms. Otolaryngeal: No hearing loss, no earache, no tinnitus, and no purulent nasal discharge.  No nasal passage blockage (stuffiness),  no snoring, no sneezing, no hoarseness, and no sore throat. Cardiovascular: No chest pain or discomfort  and no palpitations. Pulmonary: No dyspnea, no cough, and no wheezing. Gastrointestinal: No dysphagia  and no heartburn.  No nausea, no abdominal pain, and no melena.  No diarrhea. Genitourinary: No dysuria. Endocrine: No muscle weakness. Musculoskeletal: No calf muscle cramps, no arthralgias, and no soft tissue swelling. Neurological: No dizziness, no fainting, no tingling, and no numbness. Psychological: No anxiety  and no depression. Skin: No rash.   BP:102/63,  HR: 73 b/min,  Height: 70.5 in, Weight: 172 lb, BMI: 24.3 kg/m2,   PHYSICAL EXAM: He has a prominent 2 cm mobile rubbery LEFT parotid mass  in the mid pretragal region.  Facial nerve is entirely intact both sides.   Lungs: Clear to auscultation Heart: Regular rate and rhythm without murmurs Abdomen: Soft, active Extremities: Normal configuration Neurologic: Symmetric, grossly intact.  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  We need to remove part of the saliva gland which contains the tumor on the LEFT side.  This takes about 2 hours.  You will be overnight in the hospital with [place] one night.  No strenuous activity for 2 full weeks afterwards.  The needle test last year suggested this was benign, and this is still likely to be the case.  I will see you here right before surgery.  No aspirin containing compounds for 10 days prior to surgery please. Buy some chlorhexidine (Hibiclens) scrub soap at the drug store and shower with it, emphasis on your LEFT face and neck, the night before surgery and again the morning of surgery.  Lortab 10-325 MG Oral Tablet;1/2 - 1 po q4-6h prn pain; PYK99; R0; Rx.  Don Hurst, Don Hurst 83/38/2505, 1:18 PM

## 2014-02-08 NOTE — Op Note (Signed)
DATE:  16 DEC 15  TIME:  1330    Patient Name:  Don Hurst, Don Hurst  MRN:  [id number]   Pre-Op Dx: LEFT parotid neoplasm  Post-op Dx: same   Proc: LEFT superficial parotidectomy with facial nerve preservation   Surg:  Jodi Marble T MD  Asst:  Sallee Provencal, PA  Anes:  GOT  EBL: min  Comp:  none  Findings:  2.5 cm roughly spherical rubbery mass in mid superficial LEFT parotid gland.  Facial nerve identified and preserved.  Procedure:  With the patient in a comfortable supine position, GOT anesthesia was induced without difficulty.  No paralysis was used.  The NIMS was applied in the standard fashion.  A modified Blair incision was planned and infiltrated with 1% xylocaine with 1:100,000 epinephrine.  The gland and neck were examined with the findings as described above.  A sterile preparation and draping of the LEFT face and neck using Chlorhexidine solution was performed in the standard fashion.  The neck was examined once again.  A modified Blair incision was marked and cross hatched for orientation.    The incision was sharply executed and carried down into the subcutaneous plane.  The great auricular nerve was identified and protected.  Anterior and posterior flaps were generated and held in place with 2-0 silk tie back sutures.  Working on a broad front from the SCM muscle up to the cartilaginous canal, the soft tissued were incised, beginning with electrocautery and then progressing to the Shaw thermal scalpel.  The retromandibular vein was controlled and ligated with 4-0 silk.  The tail of the gland was rolled forward from the SCM muscle and the mastoid tip.  Upon identifying the bony external canal, the groove between this and the mastoid tip was explored.  The facial n. Was identified.    The tumor was more or less dead center in the gland.  Working from the main trunk to the periphery, the branches were explored and the gland was divided.  The superior most branch was dissected  first, and then the dissection was worked down the succeedingly more inferior branches.  The tumor was rolled away from the nerve branches and not violated.  Bipolar cautery was used minimally to control bleeding.  The superficial temporal artery and vein were controlled and divided.   With the inferior most branch having been dissected, the gland was resected from the digastric muscle and removed.  The dissection went down to the masseter muscle which was not removed.    The wound was irrigated and valsalva revealed a few small oozing areas away from the nerve that were controlled with cautery.    A 10 French round drain was placed through a separate incision and laid into the parotid bed.  It was secured to the skin with a 3-0 Ethilon stitch.  The tie back sutures were released and removed.  The flaps were laid back down and secured with 4-0 chromic in the subcutaneous layer, and a running simple 5-0 Ethilon on the skin.  The drain was functional. Hemostasis was observed.  Bacitracin ointment was applied.     Dispo:  PACU to [place] for 23 hr overnight observation.  Plan:  Ice, elevation, analgesia, 23 hour suction drainage.  Tyson Alias MD

## 2014-02-08 NOTE — Anesthesia Procedure Notes (Signed)
Procedure Name: Intubation Date/Time: 02/08/2014 10:44 AM Performed by: Melynda Ripple D Pre-anesthesia Checklist: Patient identified, Emergency Drugs available, Suction available and Patient being monitored Patient Re-evaluated:Patient Re-evaluated prior to inductionOxygen Delivery Method: Circle System Utilized Preoxygenation: Pre-oxygenation with 100% oxygen Intubation Type: IV induction Ventilation: Mask ventilation without difficulty Laryngoscope Size: Mac and 3 Grade View: Grade I Tube type: Oral Number of attempts: 1 Airway Equipment and Method: stylet and oral airway Placement Confirmation: ETT inserted through vocal cords under direct vision,  positive ETCO2 and breath sounds checked- equal and bilateral Secured at: 22 cm Tube secured with: Tape Dental Injury: Teeth and Oropharynx as per pre-operative assessment

## 2014-02-08 NOTE — Discharge Instructions (Signed)
Parotidectomy Care After Refer to this sheet in the next few weeks. These instructions provide you with information on caring for yourself after your procedure. Your caregiver may also give you more specific instructions. Your treatment has been planned according to current medical practices, but problems sometimes occur. Call your caregiver if you have any problems or questions after your procedure. HOME CARE INSTRUCTIONS Wound care  Usually, your bandage (dressing) should stay on the surgical cut (incision) for at least 2 days. Your caregiver will tell you when it is okay to take it off.  Keep your incision clean after the dressing is off.  Wash it with mild soap and water. Pat it dry with a clean towel. Do not rub the incision.  You may need to put an antibiotic cream on the incision for a while. This is medicine that fights germs.  Check your incision every day to make sure that it is not red.  You will need to empty your bulb drain and record how much fluid drained from the wound. Clean the drain and bulb as instructed by your caregiver. Ask how often fluid should be measured and the bulb and drain should be cleaned. The drain will be taken out after about 3 days.  Do not take a shower or bath until your surgeon says it is okay. At first, take only sponge baths. You can probably shower about 1 day after the drain is out. You must keep the incision area dry.  Your stitches will be taken out after about 5 days. Pain  Some pain is normal after a parotidectomy. Take whatever pain medicine your surgeon prescribes. Follow the directions carefully.  Do not take over-the-counter pain medicine unless approved by your caregiver.Some painkillers, like aspirin, can cause bleeding. Diet  You can eat like you normally do once you are home. However, it might hurt to chew for a while. Stay away from foods that are hard to chew.  It may help to take your pain medicine about 30 minutes before you  eat. Other precautions  Keep your head propped up when you lie down. Try using 2 pillows to do this. Do it for about 2 weeks after your surgery.  You can probably go back to your normal routine after a few days.However, do not do anything that requires great effort until your surgeon says it is okay. SEEK MEDICAL CARE IF:  You have any questions about your medicines.  Pain does not go away, even after taking pain medicine.  You vomit or feel nauseous. SEEK IMMEDIATE MEDICAL CARE IF:   You are taking pain medicine but your pain gets much worse.  Yourincision looks red and swollen or blood or fluid leaks from the wound.  Skin on your ear or face gets red and swollen.  Your face is numb or feels weak.  You have a fever. MAKE SURE YOU:  Understand these instructions.  Will watch your condition.  Will get help right away if you are not doing well or get worse. Document Released: 03/15/2010 Document Revised: 05/05/2011 Document Reviewed: 03/15/2010 [facility] Patient Information 2015 [place], [place]. This information is not intended to replace advice given to you by your health care provider. Make sure you discuss any questions you have with your health care provider.  Keep head elevated 3-4 nights No strenuous activity x 2 weeks Clean the wound twice daily with Q-tip and water and apply a thin coat of antibiotic ointment (Bacitracin or similar).  Shower counts as one  cleaning. OK to shower beginning tomorrow Call for bleeding or infection [phone]) Recheck my office 8-9 days for suture removal.  Call [phone] for an appointment.

## 2014-02-08 NOTE — Progress Notes (Signed)
02/08/2014 6:54 PM  Xiao, Ollen Gross [id number]  Post-Op Check    Temp:  [97.4 F (36.3 C)-98.2 F (36.8 C)] 97.4 F (36.3 C) (12/16 1334) Pulse Rate:  [81-105] 90 (12/16 1530) Resp:  [14-24] 14 (12/16 1530) BP: (108-125)/(63-102) 125/73 mmHg (12/16 1530) SpO2:  [98 %-100 %] 100 % (12/16 1530),     Intake/Output Summary (Last 24 hours) at 02/08/14 1854 Last data filed at 02/08/14 1500  Gross per 24 hour  Intake   1800 ml  Output    262 ml  Net   1538 ml   JP 12 ml.  Results for orders placed or performed during the hospital encounter of 02/08/14 (from the past 24 hour(s))  Hemoglobin-hemacue, POC     Status: None   Collection Time: 02/08/14  9:23 AM  Result Value Ref Range   Hemoglobin 14.9 13.0 - 17.0 g/dL    SUBJECTIVE:  Min pain.  No SOB.  Spont void.    OBJECTIVE:  Wound flat.  Drain functioning.  Sl weakness LEFT ramus mandibularis.  IMPRESSION:  Satisfactory check  PLAN:  Suction drainage.  Drain out and home in AM.    [place], Brandt

## 2014-02-08 NOTE — Transfer of Care (Signed)
Immediate Anesthesia Transfer of Care Note  Patient: Don Hurst  Procedure(s) Performed: Procedure(s): LEFT SUPERFICIAL PAROTIDECTOMY (Left)  Patient Location: PACU  Anesthesia Type:General  Level of Consciousness: awake, alert  and oriented  Airway & Oxygen Therapy: Patient Spontanous Breathing and Patient connected to face mask oxygen  Post-op Assessment: Report given to PACU RN and Post -op Vital signs reviewed and stable  Post vital signs: Reviewed and stable  Complications: No apparent anesthesia complications

## 2014-02-08 NOTE — Anesthesia Preprocedure Evaluation (Signed)
Anesthesia Evaluation  Patient identified by MRN, date of birth, ID band Patient awake    Reviewed: Allergy & Precautions, H&P , NPO status , Patient's Chart, lab work & pertinent test results  Airway Mallampati: II  TM Distance: >3 FB Neck ROM: Full    Dental no notable dental hx. (+) Teeth Intact, Dental Advisory Given   Pulmonary neg pulmonary ROS, asthma , former smoker,  breath sounds clear to auscultation  Pulmonary exam normal       Cardiovascular negative cardio ROS  Rhythm:Regular Rate:Normal     Neuro/Psych negative neurological ROS  negative psych ROS   GI/Hepatic negative GI ROS, Neg liver ROS,   Endo/Other  negative endocrine ROS  Renal/GU negative Renal ROS  negative genitourinary   Musculoskeletal  (+) Arthritis -, Osteoarthritis,    Abdominal   Peds  Hematology negative hematology ROS (+)   Anesthesia Other Findings   Reproductive/Obstetrics negative OB ROS                             Anesthesia Physical Anesthesia Plan  ASA: II  Anesthesia Plan: General   Post-op Pain Management:    Induction: Intravenous  Airway Management Planned: Oral ETT  Additional Equipment:   Intra-op Plan:   Post-operative Plan: Extubation in OR  Informed Consent: I have reviewed the patients History and Physical, chart, labs and discussed the procedure including the risks, benefits and alternatives for the proposed anesthesia with the patient or authorized representative who has indicated his/her understanding and acceptance.   Dental advisory given  Plan Discussed with: CRNA  Anesthesia Plan Comments:         Anesthesia Quick Evaluation

## 2014-02-08 NOTE — Anesthesia Postprocedure Evaluation (Signed)
  Anesthesia Post-op Note  Patient: Don Hurst  Procedure(s) Performed: Procedure(s): LEFT SUPERFICIAL PAROTIDECTOMY (Left)  Patient Location: PACU  Anesthesia Type:General  Level of Consciousness: awake and alert   Airway and Oxygen Therapy: Patient Spontanous Breathing  Post-op Pain: none  Post-op Assessment: Post-op Vital signs reviewed, Patient's Cardiovascular Status Stable and Respiratory Function Stable  Post-op Vital Signs: Reviewed  Filed Vitals:   02/08/14 1415  BP: 119/63  Pulse: 91  Temp:   Resp: 17    Complications: No apparent anesthesia complications

## 2014-02-09 ENCOUNTER — Encounter (HOSPITAL_BASED_OUTPATIENT_CLINIC_OR_DEPARTMENT_OTHER): Payer: Self-pay | Admitting: Otolaryngology

## 2014-02-09 DIAGNOSIS — D110 Benign neoplasm of parotid gland: Secondary | ICD-10-CM | POA: Diagnosis not present

## 2014-02-09 NOTE — Discharge Summary (Signed)
  02/09/2014 7:18 AM  Rasheed, Ollen Gross [id number]  Post-Op Day 1, discharge note    Temp:  [97.4 F (36.3 C)-98.8 F (37.1 C)] 98.3 F (36.8 C) (12/17 0700) Pulse Rate:  [73-105] 73 (12/17 0700) Resp:  [14-24] 16 (12/17 0700) BP: (107-131)/(63-102) 116/66 mmHg (12/17 0700) SpO2:  [97 %-100 %] 97 % (12/17 0700),     Intake/Output Summary (Last 24 hours) at 02/09/14 0718 Last data filed at 02/09/14 0200  Gross per 24 hour  Intake   2280 ml  Output   2027 ml  Net    253 ml   JP 27 ml  Results for orders placed or performed during the hospital encounter of 02/08/14 (from the past 24 hour(s))  Hemoglobin-hemacue, POC     Status: None   Collection Time: 02/08/14  9:23 AM  Result Value Ref Range   Hemoglobin 14.9 13.0 - 17.0 g/dL    SUBJECTIVE:  Min pain.  No SOB, chest pain.  Voiding well  OBJECTIVE:  Wound flat.  Drain removed.   IMPRESSION:  Satisfactory check  PLAN:  Discharge home  Admit:  16 DEC Discharge: 17 DEC  Final Diagnosis: LEFT parotid neoplasm  Proc:  Left superficial parotidectomy with facial n preservation  Comp: none  Cond: ambulatory, taking good po. Voiding well.  Pain controlled. Drain out  [employer]: 8 days for suture removal  Instructions written and given Rx:  Hydrocodone  Hosp Course:underwent surgery without difficulty.  Observed 23hr recovery .  POD 1 doing well, drain removed, discharged to home and care of family.  Don Hurst

## 2015-05-29 DIAGNOSIS — J3081 Allergic rhinitis due to animal (cat) (dog) hair and dander: Secondary | ICD-10-CM | POA: Diagnosis not present

## 2015-05-29 DIAGNOSIS — J301 Allergic rhinitis due to pollen: Secondary | ICD-10-CM | POA: Diagnosis not present

## 2015-05-29 DIAGNOSIS — J3089 Other allergic rhinitis: Secondary | ICD-10-CM | POA: Diagnosis not present

## 2015-06-18 DIAGNOSIS — J3081 Allergic rhinitis due to animal (cat) (dog) hair and dander: Secondary | ICD-10-CM | POA: Diagnosis not present

## 2015-06-18 DIAGNOSIS — J301 Allergic rhinitis due to pollen: Secondary | ICD-10-CM | POA: Diagnosis not present

## 2015-06-18 DIAGNOSIS — J3089 Other allergic rhinitis: Secondary | ICD-10-CM | POA: Diagnosis not present

## 2015-07-02 DIAGNOSIS — J301 Allergic rhinitis due to pollen: Secondary | ICD-10-CM | POA: Diagnosis not present

## 2015-07-02 DIAGNOSIS — J3081 Allergic rhinitis due to animal (cat) (dog) hair and dander: Secondary | ICD-10-CM | POA: Diagnosis not present

## 2015-07-02 DIAGNOSIS — J3089 Other allergic rhinitis: Secondary | ICD-10-CM | POA: Diagnosis not present

## 2015-08-07 DIAGNOSIS — J3081 Allergic rhinitis due to animal (cat) (dog) hair and dander: Secondary | ICD-10-CM | POA: Diagnosis not present

## 2015-08-07 DIAGNOSIS — J3089 Other allergic rhinitis: Secondary | ICD-10-CM | POA: Diagnosis not present

## 2015-08-07 DIAGNOSIS — J301 Allergic rhinitis due to pollen: Secondary | ICD-10-CM | POA: Diagnosis not present

## 2015-08-21 DIAGNOSIS — J3089 Other allergic rhinitis: Secondary | ICD-10-CM | POA: Diagnosis not present

## 2015-08-21 DIAGNOSIS — J3081 Allergic rhinitis due to animal (cat) (dog) hair and dander: Secondary | ICD-10-CM | POA: Diagnosis not present

## 2015-08-21 DIAGNOSIS — J301 Allergic rhinitis due to pollen: Secondary | ICD-10-CM | POA: Diagnosis not present

## 2015-08-29 DIAGNOSIS — J301 Allergic rhinitis due to pollen: Secondary | ICD-10-CM | POA: Diagnosis not present

## 2015-08-29 DIAGNOSIS — J3081 Allergic rhinitis due to animal (cat) (dog) hair and dander: Secondary | ICD-10-CM | POA: Diagnosis not present

## 2015-08-29 DIAGNOSIS — J3089 Other allergic rhinitis: Secondary | ICD-10-CM | POA: Diagnosis not present

## 2015-09-03 DIAGNOSIS — J3089 Other allergic rhinitis: Secondary | ICD-10-CM | POA: Diagnosis not present

## 2015-09-03 DIAGNOSIS — J3081 Allergic rhinitis due to animal (cat) (dog) hair and dander: Secondary | ICD-10-CM | POA: Diagnosis not present

## 2015-09-03 DIAGNOSIS — J301 Allergic rhinitis due to pollen: Secondary | ICD-10-CM | POA: Diagnosis not present

## 2015-09-17 DIAGNOSIS — J3089 Other allergic rhinitis: Secondary | ICD-10-CM | POA: Diagnosis not present

## 2015-09-17 DIAGNOSIS — J3081 Allergic rhinitis due to animal (cat) (dog) hair and dander: Secondary | ICD-10-CM | POA: Diagnosis not present

## 2015-09-17 DIAGNOSIS — J301 Allergic rhinitis due to pollen: Secondary | ICD-10-CM | POA: Diagnosis not present

## 2015-11-08 DIAGNOSIS — J301 Allergic rhinitis due to pollen: Secondary | ICD-10-CM | POA: Diagnosis not present

## 2015-11-08 DIAGNOSIS — J3089 Other allergic rhinitis: Secondary | ICD-10-CM | POA: Diagnosis not present

## 2015-11-15 DIAGNOSIS — J3089 Other allergic rhinitis: Secondary | ICD-10-CM | POA: Diagnosis not present

## 2015-11-15 DIAGNOSIS — J3081 Allergic rhinitis due to animal (cat) (dog) hair and dander: Secondary | ICD-10-CM | POA: Diagnosis not present

## 2015-11-15 DIAGNOSIS — J301 Allergic rhinitis due to pollen: Secondary | ICD-10-CM | POA: Diagnosis not present

## 2015-11-22 DIAGNOSIS — J301 Allergic rhinitis due to pollen: Secondary | ICD-10-CM | POA: Diagnosis not present

## 2015-11-22 DIAGNOSIS — J3089 Other allergic rhinitis: Secondary | ICD-10-CM | POA: Diagnosis not present

## 2015-11-22 DIAGNOSIS — J3081 Allergic rhinitis due to animal (cat) (dog) hair and dander: Secondary | ICD-10-CM | POA: Diagnosis not present

## 2015-12-21 DIAGNOSIS — J3089 Other allergic rhinitis: Secondary | ICD-10-CM | POA: Diagnosis not present

## 2015-12-21 DIAGNOSIS — J3081 Allergic rhinitis due to animal (cat) (dog) hair and dander: Secondary | ICD-10-CM | POA: Diagnosis not present

## 2015-12-21 DIAGNOSIS — J301 Allergic rhinitis due to pollen: Secondary | ICD-10-CM | POA: Diagnosis not present

## 2015-12-24 DIAGNOSIS — J3081 Allergic rhinitis due to animal (cat) (dog) hair and dander: Secondary | ICD-10-CM | POA: Diagnosis not present

## 2015-12-24 DIAGNOSIS — J3089 Other allergic rhinitis: Secondary | ICD-10-CM | POA: Diagnosis not present

## 2015-12-24 DIAGNOSIS — J301 Allergic rhinitis due to pollen: Secondary | ICD-10-CM | POA: Diagnosis not present

## 2016-01-23 DIAGNOSIS — J3089 Other allergic rhinitis: Secondary | ICD-10-CM | POA: Diagnosis not present

## 2016-01-23 DIAGNOSIS — J3081 Allergic rhinitis due to animal (cat) (dog) hair and dander: Secondary | ICD-10-CM | POA: Diagnosis not present

## 2016-01-23 DIAGNOSIS — J301 Allergic rhinitis due to pollen: Secondary | ICD-10-CM | POA: Diagnosis not present

## 2016-01-28 ENCOUNTER — Other Ambulatory Visit: Payer: Self-pay | Admitting: Family Medicine

## 2016-01-28 DIAGNOSIS — K4090 Unilateral inguinal hernia, without obstruction or gangrene, not specified as recurrent: Secondary | ICD-10-CM

## 2016-01-28 DIAGNOSIS — Z0000 Encounter for general adult medical examination without abnormal findings: Secondary | ICD-10-CM | POA: Diagnosis not present

## 2016-01-29 ENCOUNTER — Other Ambulatory Visit: Payer: Self-pay | Admitting: Family Medicine

## 2016-01-29 DIAGNOSIS — K4090 Unilateral inguinal hernia, without obstruction or gangrene, not specified as recurrent: Secondary | ICD-10-CM

## 2016-01-30 ENCOUNTER — Other Ambulatory Visit: Payer: Federal, State, Local not specified - PPO

## 2016-01-31 ENCOUNTER — Ambulatory Visit
Admission: RE | Admit: 2016-01-31 | Discharge: 2016-01-31 | Disposition: A | Discharge status: Home / Self Care | Payer: Federal, State, Local not specified - PPO | Source: Ambulatory Visit | Attending: Family Medicine | Admitting: Family Medicine

## 2016-01-31 DIAGNOSIS — K4090 Unilateral inguinal hernia, without obstruction or gangrene, not specified as recurrent: Secondary | ICD-10-CM

## 2016-01-31 DIAGNOSIS — R1032 Left lower quadrant pain: Secondary | ICD-10-CM | POA: Diagnosis not present

## 2016-02-01 ENCOUNTER — Ambulatory Visit: Payer: Self-pay | Admitting: Surgery

## 2016-02-01 DIAGNOSIS — K4090 Unilateral inguinal hernia, without obstruction or gangrene, not specified as recurrent: Secondary | ICD-10-CM | POA: Diagnosis not present

## 2016-02-01 NOTE — H&P (Signed)
Don Hurst 02/01/2016 3:56 PM Location: [facility] Patient #: [id number] DOB: 1960-03-21 Married / Language: English / Race: Black or African American Male  History of Present Illness (Greogry Goodwyn A. Kae Heller MD; 02/01/2016 4:36 PM) Patient words: This is a very pleasant 55 year old gentleman who presents with a left inguinal hernia that he noticed a couple of months ago. It has begun to cause him significant pain. He has had such pain to the point that at work the other day he was doubled over and was tented to call ambulance. The pain is nonradiating and in the left groin. It is achy and quality. He denies any urinary symptoms. Reports mild constipation but denies any abdominal bloating, nausea, or vomiting. 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 His typical routine also involves exercises with weights 4-5 times a week, and he's been able unable to do this for the last couple weeks He previously had a right open inguinal hernia repair by Dr. Marlou Starks several years ago and has no issues on this side.  The patient is a 55 year old male.   Other Problems Nance Pear, [place]; 02/01/2016 3:56 PM) Asthma Gastroesophageal Reflux Disease Inguinal Hernia  Past Surgical History Nance Pear, CMA; 02/01/2016 3:56 PM) Laparoscopic Inguinal Hernia Surgery Right.  Diagnostic Studies History Nance Pear, [place]; 02/01/2016 3:56 PM) Colonoscopy never  Allergies Bary Castilla Candy Kitchen, [place]; 02/01/2016 3:57 PM) No Known Drug Allergies 02/01/2016  Medication History Nance Pear, [place]; 02/01/2016 3:58 PM) ProAir HFA (108 (90 Base)MCG/ACT Aerosol Soln, Inhalation as needed) Active. Tylenol (325MG  Tablet, Oral as needed) Active. Vitamin C (100MG  Tablet, Oral as needed) Active. Vitamin E (400UNIT Tablet, Oral as needed) Active. ZyrTEC Allergy (10MG  Capsule, Oral as needed) Active. Medications  Reconciled  Social History Nance Pear, [place]; 02/01/2016 3:56 PM) Alcohol use Remotely quit alcohol use. No caffeine use No drug use Tobacco use Former smoker.  Family History Nance Pear, [place]; 02/01/2016 3:56 PM) Alcohol Abuse Father. Arthritis Mother. Cerebrovascular Accident Mother. Colon Cancer Father. Family history unknown First Degree Relatives Hypertension Father, Mother, Sister. Migraine Headache Daughter.     Review of Systems ([place]; 02/01/2016 3:56 PM) General Present- Chills. Not Present- Appetite Loss, Fatigue, Fever, Night Sweats, Weight Gain and Weight Loss. Skin Not Present- Change in Wart/Mole, Dryness, Hives, Jaundice, New Lesions, Non-Healing Wounds, Rash and Ulcer. HEENT Present- Earache, Seasonal Allergies, Sinus Pain and Wears glasses/contact lenses. Not Present- Hearing Loss, Hoarseness, Nose Bleed, Oral Ulcers, Ringing in the Ears, Sore Throat, Visual Disturbances and Yellow Eyes. Respiratory Present- Snoring. Not Present- Bloody sputum, Chronic Cough, Difficulty Breathing and Wheezing. Breast Not Present- Breast Mass, Breast Pain, Nipple Discharge and Skin Changes. Cardiovascular Present- Shortness of Breath. Not Present- Chest Pain, Difficulty Breathing Lying Down, Leg Cramps, Palpitations, Rapid Heart Rate and Swelling of Extremities. Gastrointestinal Present- Abdominal Pain. Not Present- Bloating, Bloody Stool, Change in Bowel Habits, Chronic diarrhea, Constipation, Difficulty Swallowing, Excessive gas, Gets full quickly at meals, Hemorrhoids, Indigestion, Nausea, Rectal Pain and Vomiting. Male Genitourinary Not Present- Blood in Urine, Change in Urinary Stream, Frequency, Impotence, Nocturia, Painful Urination, Urgency and Urine Leakage. Musculoskeletal Present- Back Pain. Not Present- Joint Pain, Joint Stiffness, Muscle Pain, Muscle Weakness and Swelling of Extremities. Neurological Present- Trouble walking. Not Present-  Decreased Memory, Fainting, Headaches, Numbness, Seizures, Tingling, Tremor and Weakness. Psychiatric Present- Change in Sleep Pattern. Not Present- Anxiety, Bipolar, Depression, Fearful and Frequent crying. Endocrine  Present- Cold Intolerance. Not Present- Excessive Hunger, Hair Changes, Heat Intolerance, Hot flashes and New Diabetes. Hematology Not Present- Blood Thinners, Easy Bruising, Excessive bleeding, Gland problems, HIV and Persistent Infections.  Vitals Bary Castilla Bradford CMA; 02/01/2016 3:59 PM) 02/01/2016 3:58 PM Weight: 162.6 lb Height: 70in Body Surface Area: 1.91 m Body Mass Index: 23.33 kg/m  Temp.: 99.68F  Pulse: 115 (Regular)  BP: 124/78 (Sitting, Left Arm, Standard)      Physical Exam (Rebbie Lauricella A. Kae Heller MD; 02/01/2016 4:38 PM)  General Note: He is alert and oriented, no distress. He is lying down on his side on the exam table and seems uncomfortable when he has to move around  Integumentary Note: No rashes or lesions  Head and Neck Note: No mass or thyromegaly  Eye Note: Extraction of motion intact. Pupils equally round and reactive  ENMT Note: moist mucus membranes, good dentition  Chest and Lung Exam Note: Unlabored respirations. Clear bilaterally  Cardiovascular Note: Regular rate and rhythm, palpable distal pulses  Abdomen Note: Soft, nontender, nondistended. No umbilical hernia. No mass or organomegaly. The hernia repair on the right side is intact without recurrence. There is a hernia on the left side which is reducible in supine  Neurologic Note: Grossly normal, normal gait  Neuropsychiatric Note: Normal mood and affect, appropriate insight    Assessment & Plan (Saralyn Willison A. Kae Heller MD; 02/01/2016 4:40 PM)  INGUINAL HERNIA OF LEFT SIDE WITHOUT OBSTRUCTION OR GANGRENE (Principal Diagnosis) (K40.90) Story: Symptomatic left inguinal hernia. We'll plan for laparoscopic repair. Discussed use of a hernia girdle, ice, and rest to  alleviate symptoms in the interim. We discussed the symptoms and signs of strangulation/incarceration which should prompt him to present to the ER. He expressed understanding. At this point his work requires him to do a lot of walking, lifting, and even sitting all of which exacerbate his pain. We discussed taking some time off; he will need to continue rest until at least 2 weeks post surgery.

## 2016-02-04 ENCOUNTER — Other Ambulatory Visit: Payer: Federal, State, Local not specified - PPO

## 2016-02-05 ENCOUNTER — Encounter (HOSPITAL_COMMUNITY): Payer: Self-pay | Admitting: *Deleted

## 2016-02-06 ENCOUNTER — Ambulatory Visit (HOSPITAL_COMMUNITY)
Admission: RE | Admit: 2016-02-06 | Discharge: 2016-02-06 | Disposition: A | Discharge status: Home / Self Care | Payer: Federal, State, Local not specified - PPO | Source: Ambulatory Visit | Attending: Surgery | Admitting: Surgery

## 2016-02-06 ENCOUNTER — Ambulatory Visit (HOSPITAL_COMMUNITY): Payer: Federal, State, Local not specified - PPO | Admitting: Anesthesiology

## 2016-02-06 ENCOUNTER — Encounter (HOSPITAL_COMMUNITY)
Admission: RE | Disposition: A | Discharge status: Home / Self Care | Payer: Self-pay | Source: Ambulatory Visit | Attending: Surgery

## 2016-02-06 ENCOUNTER — Encounter (HOSPITAL_COMMUNITY): Payer: Self-pay | Admitting: *Deleted

## 2016-02-06 DIAGNOSIS — K4090 Unilateral inguinal hernia, without obstruction or gangrene, not specified as recurrent: Secondary | ICD-10-CM | POA: Diagnosis not present

## 2016-02-06 DIAGNOSIS — J45909 Unspecified asthma, uncomplicated: Secondary | ICD-10-CM | POA: Insufficient documentation

## 2016-02-06 DIAGNOSIS — J309 Allergic rhinitis, unspecified: Secondary | ICD-10-CM | POA: Diagnosis not present

## 2016-02-06 DIAGNOSIS — Z87891 Personal history of nicotine dependence: Secondary | ICD-10-CM | POA: Insufficient documentation

## 2016-02-06 DIAGNOSIS — Z800 Family history of malignant neoplasm of digestive organs: Secondary | ICD-10-CM | POA: Diagnosis not present

## 2016-02-06 DIAGNOSIS — E739 Lactose intolerance, unspecified: Secondary | ICD-10-CM | POA: Diagnosis not present

## 2016-02-06 DIAGNOSIS — K219 Gastro-esophageal reflux disease without esophagitis: Secondary | ICD-10-CM | POA: Insufficient documentation

## 2016-02-06 DIAGNOSIS — K589 Irritable bowel syndrome without diarrhea: Secondary | ICD-10-CM | POA: Diagnosis not present

## 2016-02-06 HISTORY — DX: Personal history of other diseases of the respiratory system: Z87.09

## 2016-02-06 HISTORY — PX: INGUINAL HERNIA REPAIR: SHX194

## 2016-02-06 HISTORY — DX: Gastro-esophageal reflux disease without esophagitis: K21.9

## 2016-02-06 HISTORY — PX: INSERTION OF MESH: SHX5868

## 2016-02-06 LAB — CBC
HCT: 42 % (ref 39.0–52.0)
Hemoglobin: 14.1 g/dL (ref 13.0–17.0)
MCH: 30.9 pg (ref 26.0–34.0)
MCHC: 33.6 g/dL (ref 30.0–36.0)
MCV: 91.9 fL (ref 78.0–100.0)
PLATELETS: 215 10*3/uL (ref 150–400)
RBC: 4.57 MIL/uL (ref 4.22–5.81)
RDW: 12.8 % (ref 11.5–15.5)
WBC: 5.1 10*3/uL (ref 4.0–10.5)

## 2016-02-06 SURGERY — REPAIR, HERNIA, INGUINAL, LAPAROSCOPIC
Anesthesia: General | Laterality: Left

## 2016-02-06 MED ORDER — DOCUSATE SODIUM 100 MG PO CAPS: 100.0000 mg | ORAL_CAPSULE | Freq: Two times a day (BID) | ORAL | 0 refills | Status: AC

## 2016-02-06 MED ORDER — PROMETHAZINE HCL 25 MG/ML IJ SOLN: 6.2500 mg | INTRAMUSCULAR | Status: DC | PRN

## 2016-02-06 MED ORDER — FENTANYL CITRATE (PF) 250 MCG/5ML IJ SOLN
INTRAMUSCULAR | Status: AC
  Filled 2016-02-06: qty 5

## 2016-02-06 MED ORDER — ONDANSETRON HCL 4 MG/2ML IJ SOLN
INTRAMUSCULAR | Status: AC
  Filled 2016-02-06: qty 2

## 2016-02-06 MED ORDER — CEFAZOLIN SODIUM-DEXTROSE 2-4 GM/100ML-% IV SOLN
2.0000 g | INTRAVENOUS | Status: AC
  Administered 2016-02-06: 2 g via INTRAVENOUS

## 2016-02-06 MED ORDER — PROPOFOL 10 MG/ML IV BOLUS
INTRAVENOUS | Status: AC
  Filled 2016-02-06: qty 20

## 2016-02-06 MED ORDER — MIDAZOLAM HCL 2 MG/2ML IJ SOLN
INTRAMUSCULAR | Status: DC | PRN
  Administered 2016-02-06: 2 mg via INTRAVENOUS

## 2016-02-06 MED ORDER — ROCURONIUM BROMIDE 10 MG/ML (PF) SYRINGE
PREFILLED_SYRINGE | INTRAVENOUS | Status: DC | PRN
  Administered 2016-02-06: 10 mg via INTRAVENOUS
  Administered 2016-02-06: 50 mg via INTRAVENOUS
  Administered 2016-02-06: 10 mg via INTRAVENOUS

## 2016-02-06 MED ORDER — SUGAMMADEX SODIUM 200 MG/2ML IV SOLN
INTRAVENOUS | Status: AC
  Filled 2016-02-06: qty 2

## 2016-02-06 MED ORDER — SUGAMMADEX SODIUM 200 MG/2ML IV SOLN
INTRAVENOUS | Status: DC | PRN
  Administered 2016-02-06: 150 mg via INTRAVENOUS

## 2016-02-06 MED ORDER — LIDOCAINE 2% (20 MG/ML) 5 ML SYRINGE
INTRAMUSCULAR | Status: DC | PRN
Start: 2016-02-06 — End: 2016-02-06
  Administered 2016-02-06: 100 mg via INTRAVENOUS

## 2016-02-06 MED ORDER — HYDROMORPHONE HCL 2 MG/ML IJ SOLN
INTRAMUSCULAR | Status: AC
  Filled 2016-02-06: qty 1

## 2016-02-06 MED ORDER — ONDANSETRON HCL 4 MG/2ML IJ SOLN
INTRAMUSCULAR | Status: DC | PRN
  Administered 2016-02-06: 4 mg via INTRAVENOUS

## 2016-02-06 MED ORDER — BUPIVACAINE LIPOSOME 1.3 % IJ SUSP
20.0000 mL | Freq: Once | INTRAMUSCULAR | Status: AC
  Administered 2016-02-06: 20 mL
  Filled 2016-02-06: qty 20

## 2016-02-06 MED ORDER — LACTATED RINGERS IR SOLN: Status: DC | PRN

## 2016-02-06 MED ORDER — ROCURONIUM BROMIDE 50 MG/5ML IV SOSY
PREFILLED_SYRINGE | INTRAVENOUS | Status: AC
  Filled 2016-02-06: qty 5

## 2016-02-06 MED ORDER — SODIUM CHLORIDE 0.9% FLUSH: 3.0000 mL | INTRAVENOUS | Status: DC | PRN

## 2016-02-06 MED ORDER — CEFAZOLIN SODIUM-DEXTROSE 2-4 GM/100ML-% IV SOLN
INTRAVENOUS | Status: AC
  Filled 2016-02-06: qty 100

## 2016-02-06 MED ORDER — CHLORHEXIDINE GLUCONATE CLOTH 2 % EX PADS: 6.0000 | MEDICATED_PAD | Freq: Once | CUTANEOUS | Status: DC

## 2016-02-06 MED ORDER — HYDROCODONE-ACETAMINOPHEN 5-325 MG PO TABS: 1.0000 | ORAL_TABLET | Freq: Four times a day (QID) | ORAL | 0 refills | Status: DC | PRN

## 2016-02-06 MED ORDER — LIDOCAINE 2% (20 MG/ML) 5 ML SYRINGE
INTRAMUSCULAR | Status: AC
  Filled 2016-02-06: qty 5

## 2016-02-06 MED ORDER — SODIUM CHLORIDE 0.9 % IV SOLN: 250.0000 mL | INTRAVENOUS | Status: DC | PRN

## 2016-02-06 MED ORDER — FENTANYL CITRATE (PF) 100 MCG/2ML IJ SOLN: 25.0000 ug | INTRAMUSCULAR | Status: DC | PRN

## 2016-02-06 MED ORDER — FENTANYL CITRATE (PF) 100 MCG/2ML IJ SOLN
INTRAMUSCULAR | Status: DC | PRN
Start: 2016-02-06 — End: 2016-02-06
  Administered 2016-02-06: 50 ug via INTRAVENOUS
  Administered 2016-02-06: 150 ug via INTRAVENOUS
  Administered 2016-02-06: 50 ug via INTRAVENOUS

## 2016-02-06 MED ORDER — LACTATED RINGERS IV SOLN
INTRAVENOUS | Status: DC
  Administered 2016-02-06 (×2): via INTRAVENOUS

## 2016-02-06 MED ORDER — DEXAMETHASONE SODIUM PHOSPHATE 10 MG/ML IJ SOLN
INTRAMUSCULAR | Status: AC
  Filled 2016-02-06: qty 1

## 2016-02-06 MED ORDER — HYDROMORPHONE HCL 2 MG/ML IJ SOLN
0.2500 mg | INTRAMUSCULAR | Status: DC | PRN
  Administered 2016-02-06 (×2): 0.5 mg via INTRAVENOUS

## 2016-02-06 MED ORDER — PROPOFOL 10 MG/ML IV BOLUS
INTRAVENOUS | Status: DC | PRN
  Administered 2016-02-06: 150 mg via INTRAVENOUS

## 2016-02-06 MED ORDER — MIDAZOLAM HCL 2 MG/2ML IJ SOLN
INTRAMUSCULAR | Status: AC
  Filled 2016-02-06: qty 2

## 2016-02-06 MED ORDER — SUCCINYLCHOLINE CHLORIDE 200 MG/10ML IV SOSY
PREFILLED_SYRINGE | INTRAVENOUS | Status: AC
  Filled 2016-02-06: qty 10

## 2016-02-06 MED ORDER — OXYCODONE HCL 5 MG PO TABS: 5.0000 mg | ORAL_TABLET | ORAL | Status: DC | PRN

## 2016-02-06 MED ORDER — BUPIVACAINE-EPINEPHRINE 0.25% -1:200000 IJ SOLN
INTRAMUSCULAR | Status: DC | PRN
  Administered 2016-02-06: 10 mL

## 2016-02-06 MED ORDER — KETOROLAC TROMETHAMINE 30 MG/ML IJ SOLN
INTRAMUSCULAR | Status: DC | PRN
  Administered 2016-02-06: 30 mg via INTRAVENOUS

## 2016-02-06 MED ORDER — DEXAMETHASONE SODIUM PHOSPHATE 10 MG/ML IJ SOLN
INTRAMUSCULAR | Status: DC | PRN
  Administered 2016-02-06: 10 mg via INTRAVENOUS

## 2016-02-06 MED ORDER — SODIUM CHLORIDE 0.9% FLUSH: 3.0000 mL | Freq: Two times a day (BID) | INTRAVENOUS | Status: DC

## 2016-02-06 MED ORDER — ACETAMINOPHEN 650 MG RE SUPP
650.0000 mg | RECTAL | Status: DC | PRN
  Filled 2016-02-06: qty 1

## 2016-02-06 MED ORDER — ACETAMINOPHEN 325 MG PO TABS: 650.0000 mg | ORAL_TABLET | ORAL | Status: DC | PRN

## 2016-02-06 SURGICAL SUPPLY — 32 items
ADH SKN CLS APL DERMABOND .7 (GAUZE/BANDAGES/DRESSINGS) ×1
CABLE HIGH FREQUENCY MONO STRZ (ELECTRODE) ×2 IMPLANT
CHLORAPREP W/TINT 26ML (MISCELLANEOUS) ×2 IMPLANT
COVER SURGICAL LIGHT HANDLE (MISCELLANEOUS) ×2 IMPLANT
DECANTER SPIKE VIAL GLASS SM (MISCELLANEOUS) ×2 IMPLANT
DERMABOND ADVANCED (GAUZE/BANDAGES/DRESSINGS) ×1
DERMABOND ADVANCED .7 DNX12 (GAUZE/BANDAGES/DRESSINGS) ×1 IMPLANT
DEVICE SECURE STRAP 25 ABSORB (INSTRUMENTS) ×3 IMPLANT
ELECT REM PT RETURN 9FT ADLT (ELECTROSURGICAL) ×2
ELECTRODE REM PT RTRN 9FT ADLT (ELECTROSURGICAL) ×1 IMPLANT
GLOVE BIO SURGEON STRL SZ 6 (GLOVE) ×2 IMPLANT
GLOVE INDICATOR 6.5 STRL GRN (GLOVE) ×2 IMPLANT
GOWN STRL REUS W/TWL LRG LVL3 (GOWN DISPOSABLE) ×2 IMPLANT
GOWN STRL REUS W/TWL XL LVL3 (GOWN DISPOSABLE) ×4 IMPLANT
IRRIG SUCT STRYKERFLOW 2 WTIP (MISCELLANEOUS)
IRRIGATION SUCT STRKRFLW 2 WTP (MISCELLANEOUS) ×1 IMPLANT
KIT BASIN OR (CUSTOM PROCEDURE TRAY) ×2 IMPLANT
MESH 3DMAX LIGHT 4.1X6.2 LT LR (Mesh General) ×1 IMPLANT
NDL INSUFFLATION 14GA 150MM (NEEDLE) IMPLANT
NEEDLE INSUFFLATION 14GA 150MM (NEEDLE) ×2 IMPLANT
SCISSORS LAP 5X35 DISP (ENDOMECHANICALS) ×2 IMPLANT
SLEEVE XCEL OPT CAN 5 100 (ENDOMECHANICALS) ×2 IMPLANT
SUT MNCRL AB 4-0 PS2 18 (SUTURE) ×2 IMPLANT
TAPE CLOTH 4X10 WHT NS (GAUZE/BANDAGES/DRESSINGS) IMPLANT
TAPE SURG TRANSPORE 1 IN (GAUZE/BANDAGES/DRESSINGS) IMPLANT
TAPE SURGICAL TRANSPORE 1 IN (GAUZE/BANDAGES/DRESSINGS)
TOWEL OR 17X26 10 PK STRL BLUE (TOWEL DISPOSABLE) ×2 IMPLANT
TOWEL OR NON WOVEN STRL DISP B (DISPOSABLE) ×1 IMPLANT
TRAY LAPAROSCOPIC (CUSTOM PROCEDURE TRAY) ×2 IMPLANT
TROCAR BLADELESS OPT 5 100 (ENDOMECHANICALS) ×2 IMPLANT
TROCAR XCEL BLUNT TIP 100MML (ENDOMECHANICALS) ×2 IMPLANT
TUBING INSUF HEATED (TUBING) ×2 IMPLANT

## 2016-02-06 NOTE — Anesthesia Postprocedure Evaluation (Signed)
Anesthesia Post Note  Patient: Ollen Gross Vicens  Procedure(s) Performed: Procedure(s) (LRB): LAPAROSCOPIC LEFT INGUINAL HERNIA REPAIR (Left) INSERTION OF MESH (Left)  Patient location during evaluation: PACU Anesthesia Type: General Level of consciousness: awake and alert Pain management: pain level controlled Vital Signs Assessment: post-procedure vital signs reviewed and stable Respiratory status: spontaneous breathing, nonlabored ventilation, respiratory function stable and patient connected to nasal cannula oxygen Cardiovascular status: blood pressure returned to baseline and stable Postop Assessment: no signs of nausea or vomiting Anesthetic complications: no    Last Vitals:  Vitals:   02/06/16 1300 02/06/16 1354  BP: 120/82 122/74  Pulse:  77  Resp:  15  Temp:  36.9 C    Last Pain:  Vitals:   02/06/16 0910  TempSrc: Oral                 Brave Dack,JAMES TERRILL

## 2016-02-06 NOTE — Anesthesia Procedure Notes (Signed)
Procedure Name: Intubation Date/Time: 02/06/2016 11:11 AM Performed by: Danley Danker L Patient Re-evaluated:Patient Re-evaluated prior to inductionOxygen Delivery Method: Circle system utilized Preoxygenation: Pre-oxygenation with 100% oxygen Intubation Type: IV induction Ventilation: Mask ventilation without difficulty and Oral airway inserted - appropriate to patient size Laryngoscope Size: Miller and 3 Grade View: Grade II Tube type: Oral Tube size: 8.0 mm Number of attempts: 1 Airway Equipment and Method: Stylet Placement Confirmation: ETT inserted through vocal cords under direct vision,  positive ETCO2 and breath sounds checked- equal and bilateral Secured at: 22 cm Tube secured with: Tape Dental Injury: Teeth and Oropharynx as per pre-operative assessment

## 2016-02-06 NOTE — Anesthesia Preprocedure Evaluation (Signed)
Anesthesia Evaluation  Patient identified by MRN, date of birth, ID band Patient awake    Reviewed: Allergy & Precautions, NPO status , Patient's Chart, lab work & pertinent test results  Airway Mallampati: I  TM Distance: >3 FB Neck ROM: Full    Dental  (+) Teeth Intact   Pulmonary former smoker,    breath sounds clear to auscultation       Cardiovascular negative cardio ROS   Rhythm:Regular Rate:Normal     Neuro/Psych negative neurological ROS     GI/Hepatic Neg liver ROS, GERD  ,  Endo/Other  negative endocrine ROS  Renal/GU negative Renal ROS     Musculoskeletal   Abdominal   Peds  Hematology negative hematology ROS (+)   Anesthesia Other Findings   Reproductive/Obstetrics                             Anesthesia Physical Anesthesia Plan  ASA: II  Anesthesia Plan: General   Post-op Pain Management:    Induction: Intravenous  Airway Management Planned: Oral ETT  Additional Equipment:   Intra-op Plan:   Post-operative Plan: Extubation in OR  Informed Consent: I have reviewed the patients History and Physical, chart, labs and discussed the procedure including the risks, benefits and alternatives for the proposed anesthesia with the patient or authorized representative who has indicated his/her understanding and acceptance.   Dental advisory given  Plan Discussed with: CRNA  Anesthesia Plan Comments:         Anesthesia Quick Evaluation

## 2016-02-06 NOTE — Interval H&P Note (Signed)
History and Physical Interval Note:  02/06/2016 10:30 AM  Don Hurst  has presented today for surgery, with the diagnosis of LEFT INGUINAL HERNIA  The various methods of treatment have been discussed with the patient and family. After consideration of risks, benefits and other options for treatment, the patient has consented to  Procedure(s): LAPAROSCOPIC LEFT INGUINAL HERNIA REPAIR (Left) INSERTION OF MESH (Left) as a surgical intervention .  The patient's history has been reviewed, patient examined, no change in status, stable for surgery.  I have reviewed the patient's chart and labs.  Questions were answered to the patient's satisfaction.     [name]

## 2016-02-06 NOTE — Op Note (Signed)
Operative Report  Jayeden Dutson 54 y.o. male  [id number]  [id number]  02/06/2016  Surgeon: Clovis Riley   Assistant: none  Procedure performed: Laparoscopic TAPP left inguinal hernia repair with mesh. Laparoscopic-assisted TAPS block.  Preop diagnosis: left inguinal hernia  Post-op diagnosis/intraop findings: indirect left inguinal hernia  Specimens: none  EBL: [id number]  Complications: none  Description of procedure: After obtaining informed consent the patient was brought to the operating room. Antibiotics and subcutaneous heparin were administered. SCD's were applied. General endotracheal anesthesia was initiated and a formal time-out was performed. The abdomen was prepped and draped in the usual sterile fashion and the abdomen was entered using an infraumbilical hassan technique and insufflated to 15 mmHg. A camera was then introduced, the abdomen was inspected and there is no evidence of injury from our entry. Bilateral 16mm trocars were placed. A peritoneal flap was created in the left pelvis using cautery and blunt dissection until the indirect sac had completely reduced. The spermatic vessels, vas and inferior epigastrics were identified and preserved. A large Bard 3dMax Light mesh was then brought into the field and trimmed slightly. This was introduced into the abdomen and positioned to lie flat within the preperitoneal space. It was tacked to coopers ligament with two secure-strap vicryl tacs and then tacked superiorly on either side of the inferior epigastrics. The peritoneal flap was then lifted back up to completely cover the mesh and was tacked back to the abdominal wall. The abdomen was then re-inspected confirming hemostasis and no unanticipated injury. A TAPS block was then performed under laparoscopic visualization using exparel on the left side. The hassan was removed and the fascia closed with the pursestring 0-vicryl that had been placed at the beginning of the case.  This was seen to be airtight under laparoscopic visualization with no entrapped structures. The abdomen was then desufflated and the 46mm trocars removed. The skin incisions were closed with running subcuticular monocryl and Dermabond. The patient was awakened, extubated and transported to the recovery room in stable condition.   All counts were correct at the completion of the case.

## 2016-02-06 NOTE — Discharge Instructions (Signed)
[facility] _______[facility]   INGUINAL HERNIA REPAIR: POST OP INSTRUCTIONS  Always review your discharge instruction sheet given to you by the facility where your surgery was performed. IF YOU HAVE DISABILITY OR FAMILY LEAVE FORMS, YOU MUST BRING THEM TO THE OFFICE FOR PROCESSING.   DO NOT GIVE THEM TO YOUR DOCTOR.  1. A  prescription for pain medication may be given to you upon discharge.  Take your pain medication as prescribed, if needed.  If narcotic pain medicine is not needed, then you may take acetaminophen (Tylenol) or ibuprofen (Advil) as needed. 2. Take your usually prescribed medications unless otherwise directed. If you need a refill on your pain medication, please contact your pharmacy.  They will contact our office to request authorization. Prescriptions will not be filled after 5 pm or on week-ends. 3. You should follow a light diet the first 24 hours after arrival home, such as soup and crackers, etc.  Be sure to include lots of fluids daily.  Resume your normal diet the day after surgery. 4.Most patients will experience some swelling and bruising around the umbilicus or in the groin and scrotum.  Ice packs and reclining will help.  Swelling and bruising can take several days to resolve.  6. It is common to experience some constipation if taking pain medication after surgery.  Increasing fluid intake and taking a stool softener (such as Colace) will usually help or prevent this problem from occurring.  A mild laxative (Milk of Magnesia or Miralax) should be taken according to package directions if there are no bowel movements after 48 hours. 7. Unless discharge instructions indicate otherwise, you may remove your bandages 24-48 hours after surgery, and you may shower at that time.  You may have steri-strips (small skin tapes) in place directly over the incision.  These strips should be left on the skin for 7-10 days.  If your surgeon used skin glue on the incision, you may  shower in 24 hours.  The glue will flake off over the next 2-3 weeks.  Any sutures or staples will be removed at the office during your follow-up visit. 8. ACTIVITIES:  You may resume regular (light) daily activities beginning the next day--such as daily self-care, walking, climbing stairs--gradually increasing activities as tolerated.  You may have sexual intercourse when it is comfortable.  Refrain from any heavy lifting or straining until approved by your doctor.  a.You may drive when you are no longer taking prescription pain medication, you can comfortably wear a seatbelt, and you can safely maneuver your car and apply brakes. b.RETURN TO WORK:  Pending post-op follow up _____________________________________________  9.You should see your doctor in the office for a follow-up appointment approximately 2-3 weeks after your surgery.  Make sure that you call for this appointment within a day or two after you arrive home to insure a convenient appointment time. 10.OTHER INSTRUCTIONS: _________________________    _____________________________________  WHEN TO CALL YOUR DOCTOR: 1. Fever over 101.0 2. Inability to urinate 3. Nausea and/or vomiting 4. Extreme swelling or bruising 5. Continued bleeding from incision. 6. Increased pain, redness, or drainage from the incision  The clinic staff is available to answer your questions during regular business hours.  Please dont hesitate to call and ask to speak to one of the nurses for clinical concerns.  If you have a medical emergency, go to the nearest emergency room or call 911.  A surgeon from [facility] is always on call at the hospital  [address], [address], [place], [place]  [address] ? ° [address] [address], [place], [place]   [address] °[phone] ? [phone] ? FAX [phone] °Web site: [email or web address] °

## 2016-02-06 NOTE — Transfer of Care (Signed)
Immediate Anesthesia Transfer of Care Note  Patient: Don Hurst  Procedure(s) Performed: Procedure(s): LAPAROSCOPIC LEFT INGUINAL HERNIA REPAIR (Left) INSERTION OF MESH (Left)  Patient Location: PACU  Anesthesia Type:General  Level of Consciousness: awake and alert   Airway & Oxygen Therapy: Patient Spontanous Breathing and Patient connected to face mask oxygen  Post-op Assessment: Report given to RN and Post -op Vital signs reviewed and stable  Post vital signs: Reviewed and stable  Last Vitals:  Vitals:   02/06/16 0910  BP: 116/78  Resp: (!) 85  Temp: 36.8 C    Last Pain:  Vitals:   02/06/16 0910  TempSrc: Oral         Complications: No apparent anesthesia complications

## 2016-02-06 NOTE — H&P (View-Only) (Signed)
[place] 02/01/2016 3:56 PM Location: [facility] Patient #: [id number] DOB: Oct 14, 1960 Married / Language: English / Race: Black or African American Male  History of Present Illness (Chelsea A. Kae Heller MD; 02/01/2016 4:36 PM) Patient words: This is a very pleasant 55 year old gentleman who presents with a left inguinal hernia that he noticed a couple of months ago. It has begun to cause him significant pain. He has had such pain to the point that at work the other day he was doubled over and was tented to call ambulance. The pain is nonradiating and in the left groin. It is achy and quality. He denies any urinary symptoms. Reports mild constipation but denies any abdominal bloating, nausea, or vomiting. 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 His typical routine also involves exercises with weights 4-5 times a week, and he's been able unable to do this for the last couple weeks He previously had a right open inguinal hernia repair by Dr. Marlou Starks several years ago and has no issues on this side.  The patient is a 55 year old male.   Other Problems Nance Pear, [place]; 02/01/2016 3:56 PM) Asthma Gastroesophageal Reflux Disease Inguinal Hernia  Past Surgical History Nance Pear, CMA; 02/01/2016 3:56 PM) Laparoscopic Inguinal Hernia Surgery Right.  Diagnostic Studies History Nance Pear, [place]; 02/01/2016 3:56 PM) Colonoscopy never  Allergies Bary Castilla [place], [place]; 02/01/2016 3:57 PM) No Known Drug Allergies 02/01/2016  Medication History Nance Pear, [place]; 02/01/2016 3:58 PM) ProAir HFA (108 (90 Base)MCG/ACT Aerosol Soln, Inhalation as needed) Active. Tylenol (325MG  Tablet, Oral as needed) Active. Vitamin C (100MG  Tablet, Oral as needed) Active. Vitamin E (400UNIT Tablet, Oral as needed) Active. ZyrTEC Allergy (10MG  Capsule, Oral as needed) Active. Medications  Reconciled  Social History Nance Pear, [place]; 02/01/2016 3:56 PM) Alcohol use Remotely quit alcohol use. No caffeine use No drug use Tobacco use Former smoker.  Family History Nance Pear, [place]; 02/01/2016 3:56 PM) Alcohol Abuse Father. Arthritis Mother. Cerebrovascular Accident Mother. Colon Cancer Father. Family history unknown First Degree Relatives Hypertension Father, Mother, Sister. Migraine Headache Daughter.     Review of Systems ([place]; 02/01/2016 3:56 PM) General Present- Chills. Not Present- Appetite Loss, Fatigue, Fever, Night Sweats, Weight Gain and Weight Loss. Skin Not Present- Change in Wart/Mole, Dryness, Hives, Jaundice, New Lesions, Non-Healing Wounds, Rash and Ulcer. HEENT Present- Earache, Seasonal Allergies, Sinus Pain and Wears glasses/contact lenses. Not Present- Hearing Loss, Hoarseness, Nose Bleed, Oral Ulcers, Ringing in the Ears, Sore Throat, Visual Disturbances and Yellow Eyes. Respiratory Present- Snoring. Not Present- Bloody sputum, Chronic Cough, Difficulty Breathing and Wheezing. Breast Not Present- Breast Mass, Breast Pain, Nipple Discharge and Skin Changes. Cardiovascular Present- Shortness of Breath. Not Present- Chest Pain, Difficulty Breathing Lying Down, Leg Cramps, Palpitations, Rapid Heart Rate and Swelling of Extremities. Gastrointestinal Present- Abdominal Pain. Not Present- Bloating, Bloody Stool, Change in Bowel Habits, Chronic diarrhea, Constipation, Difficulty Swallowing, Excessive gas, Gets full quickly at meals, Hemorrhoids, Indigestion, Nausea, Rectal Pain and Vomiting. Male Genitourinary Not Present- Blood in Urine, Change in Urinary Stream, Frequency, Impotence, Nocturia, Painful Urination, Urgency and Urine Leakage. Musculoskeletal Present- Back Pain. Not Present- Joint Pain, Joint Stiffness, Muscle Pain, Muscle Weakness and Swelling of Extremities. Neurological Present- Trouble walking. Not Present-  Decreased Memory, Fainting, Headaches, Numbness, Seizures, Tingling, Tremor and Weakness. Psychiatric Present- Change in Sleep Pattern. Not Present- Anxiety, Bipolar, Depression, Fearful and Frequent crying. Endocrine  Present- Cold Intolerance. Not Present- Excessive Hunger, Hair Changes, Heat Intolerance, Hot flashes and New Diabetes. Hematology Not Present- Blood Thinners, Easy Bruising, Excessive bleeding, Gland problems, HIV and Persistent Infections.  Vitals Bary Castilla [place] CMA; 02/01/2016 3:59 PM) 02/01/2016 3:58 PM Weight: 162.6 lb Height: 70in Body Surface Area: 1.91 m Body Mass Index: 23.33 kg/m  Temp.: 99.59F  Pulse: 115 (Regular)  BP: 124/78 (Sitting, Left Arm, Standard)      Physical Exam (Chelsea A. Kae Heller MD; 02/01/2016 4:38 PM)  General Note: He is alert and oriented, no distress. He is lying down on his side on the exam table and seems uncomfortable when he has to move around  Integumentary Note: No rashes or lesions  Head and Neck Note: No mass or thyromegaly  Eye Note: Extraction of motion intact. Pupils equally round and reactive  ENMT Note: moist mucus membranes, good dentition  Chest and Lung Exam Note: Unlabored respirations. Clear bilaterally  Cardiovascular Note: Regular rate and rhythm, palpable distal pulses  Abdomen Note: Soft, nontender, nondistended. No umbilical hernia. No mass or organomegaly. The hernia repair on the right side is intact without recurrence. There is a hernia on the left side which is reducible in supine  Neurologic Note: Grossly normal, normal gait  Neuropsychiatric Note: Normal mood and affect, appropriate insight    Assessment & Plan (Chelsea A. Kae Heller MD; 02/01/2016 4:40 PM)  INGUINAL HERNIA OF LEFT SIDE WITHOUT OBSTRUCTION OR GANGRENE (Principal Diagnosis) (K40.90) Story: Symptomatic left inguinal hernia. We'll plan for laparoscopic repair. Discussed use of a hernia girdle, ice, and rest to  alleviate symptoms in the interim. We discussed the symptoms and signs of strangulation/incarceration which should prompt him to present to the ER. He expressed understanding. At this point his work requires him to do a lot of walking, lifting, and even sitting all of which exacerbate his pain. We discussed taking some time off; he will need to continue rest until at least 2 weeks post surgery.

## 2016-03-01 ENCOUNTER — Ambulatory Visit (INDEPENDENT_AMBULATORY_CARE_PROVIDER_SITE_OTHER): Payer: Federal, State, Local not specified - PPO | Admitting: Physician Assistant

## 2016-03-01 VITALS — BP 122/80 | HR 80 | Temp 98.3°F | Resp 18 | Ht 70.0 in | Wt 168.0 lb

## 2016-03-01 DIAGNOSIS — J069 Acute upper respiratory infection, unspecified: Secondary | ICD-10-CM

## 2016-03-01 DIAGNOSIS — B9789 Other viral agents as the cause of diseases classified elsewhere: Secondary | ICD-10-CM | POA: Diagnosis not present

## 2016-03-01 MED ORDER — BENZONATATE 200 MG PO CAPS: 200.0000 mg | ORAL_CAPSULE | Freq: Two times a day (BID) | ORAL | 0 refills | Status: DC | PRN

## 2016-03-01 MED ORDER — HYDROCODONE-HOMATROPINE 5-1.5 MG/5ML PO SYRP: 5.0000 mL | ORAL_SOLUTION | Freq: Three times a day (TID) | ORAL | 0 refills | Status: DC | PRN

## 2016-03-01 NOTE — Progress Notes (Signed)
03/01/2016 3:16 PM   DOB: 12-01-1960 / MRN: [id number]  SUBJECTIVE:  Don Hurst is a 56 y.o. male presenting for cough productive of yellow sputum and he denies blood. This started 9 days ago. Reports the cough is worse at night.  He associates nasal pressure and some mild throat pain.  He does have a history of asthma and is prescribed a rescue inhaler and has been using this however the cough comes back.   He is allergic to lactose intolerance (gi).   He  has a past medical history of Allergy; Arthritis; Asthma; GERD (gastroesophageal reflux disease); History of chronic bronchitis; Multiple allergies; and Wears glasses.    He  reports that he quit smoking about 24 years ago. His smoking use included Cigarettes. He has a 0.75 pack-year smoking history. He has never used smokeless tobacco. He reports that he does not drink alcohol or use drugs. He  reports that he currently engages in sexual activity. The patient  has a past surgical history that includes Inguinal hernia repair (02/2004); Colonoscopy; Parotidectomy (Left, 02/08/2014); Inguinal hernia repair (Left, 02/06/2016); and Insertion of mesh (Left, 02/06/2016).  His family history includes Cancer (age of onset: 61) in his father; Eczema in his daughter; Hypertension in his mother and sister.  Review of Systems  Constitutional: Negative for fever.  Respiratory: Positive for cough, sputum production and wheezing. Negative for hemoptysis and shortness of breath.   Cardiovascular: Negative for chest pain, palpitations and leg swelling.  Gastrointestinal: Negative for nausea.  Neurological: Negative for dizziness.    The problem list and medications were reviewed and updated by myself where necessary and exist elsewhere in the encounter.   OBJECTIVE:  BP 122/80 (BP Location: Right Arm, Patient Position: Sitting, Cuff Size: Small)   Pulse 80   Temp 98.3 F (36.8 C) (Oral)   Resp 18   Ht 5\' 10"  (1.778 m)   Wt 168 lb (76.2 kg)   SpO2  98%   PF 550 L/min   BMI 24.11 kg/m   Pulse Readings from Last 3 Encounters:  03/01/16 80  02/06/16 80  02/09/14 73     Physical Exam  Constitutional: He is oriented to person, place, and time. He appears well-developed. He does not appear ill.  Eyes: Conjunctivae and EOM are normal. Pupils are equal, round, and reactive to light.  Cardiovascular: Normal rate and regular rhythm.   Pulmonary/Chest: Effort normal and breath sounds normal.  Abdominal: He exhibits no distension.  Musculoskeletal: Normal range of motion.  Neurological: He is alert and oriented to person, place, and time. No cranial nerve deficit. Coordination normal.  Skin: Skin is warm and dry. He is not diaphoretic.  Psychiatric: He has a normal mood and affect.  Nursing note and vitals reviewed.   No results found for this or any previous visit (from the past 72 hour(s)).  No results found.  ASSESSMENT AND PLAN:  Don Hurst was seen today for cough.  Diagnoses and all orders for this visit:  Viral URI with cough Comments: History of asthma however peak flow 550.  Advised symptomatic therapy per below. He can call if he is not improving.  Orders: -     HYDROcodone-homatropine (HYCODAN) 5-1.5 MG/5ML syrup; Take 5 mLs by mouth every 8 (eight) hours as needed for cough. -     benzonatate (TESSALON) 200 MG capsule; Take 1 capsule (200 mg total) by mouth 2 (two) times daily as needed for cough.    The patient is advised to call  or return to clinic if he does not see an improvement in symptoms, or to seek the care of the closest emergency department if he worsens with the above plan.   Philis Fendt, MHS, PA-C [facility] 03/01/2016 3:16 PM

## 2016-03-01 NOTE — Patient Instructions (Addendum)
It is okay for you to take your inhaler every 4-6 hours as needed for cough while you are sick.   IF you received an x-ray today, you will receive an invoice from [facility]. Please contact [facility] at [phone] with questions or concerns regarding your invoice.   IF you received labwork today, you will receive an invoice from [facility]. Please contact [facility] at [phone] with questions or concerns regarding your invoice.   Our billing staff will not be able to assist you with questions regarding bills from these companies.  You will be contacted with the lab results as soon as they are available. The fastest way to get your results is to activate your My Chart account. Instructions are located on the last page of this paperwork. If you have not heard from [place] regarding the results in 2 weeks, please contact this office.

## 2016-03-05 DIAGNOSIS — J3089 Other allergic rhinitis: Secondary | ICD-10-CM | POA: Diagnosis not present

## 2016-03-05 DIAGNOSIS — J3081 Allergic rhinitis due to animal (cat) (dog) hair and dander: Secondary | ICD-10-CM | POA: Diagnosis not present

## 2016-03-05 DIAGNOSIS — J301 Allergic rhinitis due to pollen: Secondary | ICD-10-CM | POA: Diagnosis not present

## 2016-03-11 DIAGNOSIS — J3089 Other allergic rhinitis: Secondary | ICD-10-CM | POA: Diagnosis not present

## 2016-03-11 DIAGNOSIS — J301 Allergic rhinitis due to pollen: Secondary | ICD-10-CM | POA: Diagnosis not present

## 2016-03-11 DIAGNOSIS — J3081 Allergic rhinitis due to animal (cat) (dog) hair and dander: Secondary | ICD-10-CM | POA: Diagnosis not present

## 2016-03-24 DIAGNOSIS — J4520 Mild intermittent asthma, uncomplicated: Secondary | ICD-10-CM | POA: Diagnosis not present

## 2016-03-24 DIAGNOSIS — J3081 Allergic rhinitis due to animal (cat) (dog) hair and dander: Secondary | ICD-10-CM | POA: Diagnosis not present

## 2016-03-24 DIAGNOSIS — J3089 Other allergic rhinitis: Secondary | ICD-10-CM | POA: Diagnosis not present

## 2016-03-24 DIAGNOSIS — H1045 Other chronic allergic conjunctivitis: Secondary | ICD-10-CM | POA: Diagnosis not present

## 2016-03-24 DIAGNOSIS — R05 Cough: Secondary | ICD-10-CM | POA: Diagnosis not present

## 2016-03-24 DIAGNOSIS — J301 Allergic rhinitis due to pollen: Secondary | ICD-10-CM | POA: Diagnosis not present

## 2016-04-03 DIAGNOSIS — J3089 Other allergic rhinitis: Secondary | ICD-10-CM | POA: Diagnosis not present

## 2016-04-03 DIAGNOSIS — J301 Allergic rhinitis due to pollen: Secondary | ICD-10-CM | POA: Diagnosis not present

## 2016-04-03 DIAGNOSIS — J3081 Allergic rhinitis due to animal (cat) (dog) hair and dander: Secondary | ICD-10-CM | POA: Diagnosis not present

## 2016-04-15 DIAGNOSIS — J3089 Other allergic rhinitis: Secondary | ICD-10-CM | POA: Diagnosis not present

## 2016-04-15 DIAGNOSIS — J3081 Allergic rhinitis due to animal (cat) (dog) hair and dander: Secondary | ICD-10-CM | POA: Diagnosis not present

## 2016-04-15 DIAGNOSIS — J301 Allergic rhinitis due to pollen: Secondary | ICD-10-CM | POA: Diagnosis not present

## 2016-04-17 DIAGNOSIS — J301 Allergic rhinitis due to pollen: Secondary | ICD-10-CM | POA: Diagnosis not present

## 2016-04-18 DIAGNOSIS — J301 Allergic rhinitis due to pollen: Secondary | ICD-10-CM | POA: Diagnosis not present

## 2016-04-18 DIAGNOSIS — J3089 Other allergic rhinitis: Secondary | ICD-10-CM | POA: Diagnosis not present

## 2016-04-18 DIAGNOSIS — J3081 Allergic rhinitis due to animal (cat) (dog) hair and dander: Secondary | ICD-10-CM | POA: Diagnosis not present

## 2016-04-21 ENCOUNTER — Other Ambulatory Visit: Payer: Self-pay | Admitting: Family Medicine

## 2016-04-21 ENCOUNTER — Ambulatory Visit (INDEPENDENT_AMBULATORY_CARE_PROVIDER_SITE_OTHER): Payer: Federal, State, Local not specified - PPO | Admitting: Family Medicine

## 2016-04-21 ENCOUNTER — Encounter: Payer: Self-pay | Admitting: Family Medicine

## 2016-04-21 VITALS — BP 123/79 | HR 79 | Temp 98.1°F | Resp 16 | Ht 69.0 in | Wt 170.0 lb

## 2016-04-21 DIAGNOSIS — Z1211 Encounter for screening for malignant neoplasm of colon: Secondary | ICD-10-CM | POA: Diagnosis not present

## 2016-04-21 DIAGNOSIS — Z23 Encounter for immunization: Secondary | ICD-10-CM

## 2016-04-21 DIAGNOSIS — R5383 Other fatigue: Secondary | ICD-10-CM

## 2016-04-21 DIAGNOSIS — J301 Allergic rhinitis due to pollen: Secondary | ICD-10-CM

## 2016-04-21 DIAGNOSIS — R946 Abnormal results of thyroid function studies: Secondary | ICD-10-CM | POA: Diagnosis not present

## 2016-04-21 LAB — CBC WITH DIFFERENTIAL/PLATELET
BASOS ABS: 0 {cells}/uL (ref 0–200)
Basophils Relative: 0 %
EOS ABS: 102 {cells}/uL (ref 15–500)
EOS PCT: 2 %
HCT: 44.9 % (ref 38.5–50.0)
HEMOGLOBIN: 14.7 g/dL (ref 13.2–17.1)
LYMPHS ABS: 2448 {cells}/uL (ref 850–3900)
Lymphocytes Relative: 48 %
MCH: 30.9 pg (ref 27.0–33.0)
MCHC: 32.7 g/dL (ref 32.0–36.0)
MCV: 94.5 fL (ref 80.0–100.0)
MPV: 10.3 fL (ref 7.5–12.5)
Monocytes Absolute: 408 cells/uL (ref 200–950)
Monocytes Relative: 8 %
NEUTROS PCT: 42 %
Neutro Abs: 2142 cells/uL (ref 1500–7800)
Platelets: 240 10*3/uL (ref 140–400)
RBC: 4.75 MIL/uL (ref 4.20–5.80)
RDW: 13.9 % (ref 11.0–15.0)
WBC: 5.1 10*3/uL (ref 3.8–10.8)

## 2016-04-21 LAB — COMPLETE METABOLIC PANEL WITH GFR
ALBUMIN: 4.3 g/dL (ref 3.6–5.1)
ALK PHOS: 72 U/L (ref 40–115)
ALT: 23 U/L (ref 9–46)
AST: 19 U/L (ref 10–35)
BILIRUBIN TOTAL: 0.5 mg/dL (ref 0.2–1.2)
BUN: 10 mg/dL (ref 7–25)
CALCIUM: 9.3 mg/dL (ref 8.6–10.3)
CO2: 27 mmol/L (ref 20–31)
Chloride: 105 mmol/L (ref 98–110)
Creat: 1 mg/dL (ref 0.70–1.33)
GFR, Est Non African American: 86 mL/min (ref >=60)
GLUCOSE: 100 mg/dL — AB (ref 65–99)
POTASSIUM: 4.3 mmol/L (ref 3.5–5.3)
SODIUM: 139 mmol/L (ref 135–146)
TOTAL PROTEIN: 7.3 g/dL (ref 6.1–8.1)

## 2016-04-21 LAB — POCT URINALYSIS DIP (DEVICE)
BILIRUBIN URINE: NEGATIVE
GLUCOSE, UA: NEGATIVE mg/dL
Hgb urine dipstick: NEGATIVE
KETONES UR: NEGATIVE mg/dL
LEUKOCYTES UA: NEGATIVE
NITRITE: NEGATIVE
PH: 5.5 (ref 5.0–8.0)
Protein, ur: NEGATIVE mg/dL
Specific Gravity, Urine: >=1.03 (ref 1.005–1.030)
Urobilinogen, UA: 0.2 mg/dL (ref 0.0–1.0)

## 2016-04-21 LAB — TSH: TSH: 4.7 m[IU]/L — AB (ref 0.40–4.50)

## 2016-04-21 LAB — POCT GLYCOSYLATED HEMOGLOBIN (HGB A1C): HEMOGLOBIN A1C: 5.6

## 2016-04-21 MED ORDER — LEVOCETIRIZINE DIHYDROCHLORIDE 5 MG PO TABS: 5.0000 mg | ORAL_TABLET | Freq: Every evening | ORAL | 11 refills | Status: DC

## 2016-04-21 NOTE — Progress Notes (Signed)
Subjective:    Patient ID: Don Hurst, male    DOB: [birth year], 56 y.o.   MRN: [id number]  HPI  Don Hurst, a 56 year old male with a history of asthma, allergic rhinitis and fatigue presents accompanied by wife [name] to establish care. He was a patient of Dr. Kristie Cowman at [facility] and [facility] prior to establishing care.Patient complains of periodic fatigue. Symptoms began several months ago. He says that he had hernia surgery 2 months ago and fatigue has worsened since that time. Sentinal symptom the patient feels fatigue began with: symptoms of arthritis, exercise intolerance and cold intolerance, constipation and change in hair texture.. Symptoms of his fatigue have been diffuse soft tissue aches and pains, general malaise and lack of interest in usual activities.  Patient denies fever, exercise intolerance, GI blood loss and witnessed or suspected sleep apnea. Symptoms have been intermittent. Severity has been symptoms bothersome, but easily able to carry out all usual work/school/family activities. He  Is also here for evaluation of possible allergic rhinitis. Patient's symptoms include itchy nose, postnasal drip and watery eyes. These symptoms are perennial. Current triggers include exposure to pollens, dust, mold and weather changes.  He has been under the care of an allergist and received injections at [facility]. He also has a history of asthma, but has not had an exacerbation in many years. He will use rescue inhaler at the onset of symptoms.  Past Medical History:  Diagnosis Date  . Allergy   . Arthritis   . Asthma   . GERD (gastroesophageal reflux disease)   . History of chronic bronchitis   . Multiple allergies   . Wears glasses    Social History   Social History  . Marital status: Married    Spouse name: N/A  . Number of children: N/A  . Years of education: N/A   Occupational History  . Not on file.   Social History Main Topics  .  Smoking status: Former Smoker    Packs/day: 0.25    Years: 3.00    Types: Cigarettes    Quit date: 03/26/1991  . Smokeless tobacco: Never Used     Comment: smoked 1 pk every 2 wks  . Alcohol use No  . Drug use: No  . Sexual activity: Yes     Comment: number of sex partners in the last 12 months  1   Other Topics Concern  . Not on file   Social History Narrative   Exercise doing weights 4 times per week for 30 minutes   Review of Systems  Constitutional: Negative for fever and unexpected weight change.  HENT: Negative.   Eyes: Negative.   Respiratory: Negative.   Cardiovascular: Negative.  Negative for chest pain, palpitations and leg swelling.  Gastrointestinal: Negative.  Negative for blood in stool and constipation.       History of IBS diarrhea  Endocrine: Negative.  Negative for polydipsia, polyphagia and polyuria.  Genitourinary: Negative.   Musculoskeletal: Negative.   Skin: Negative.   Allergic/Immunologic: Negative.  Negative for immunocompromised state.  Neurological: Negative.  Negative for dizziness.  Hematological: Negative.   Psychiatric/Behavioral: Negative.        Objective:   Physical Exam  Constitutional: He is oriented to person, place, and time. He appears well-developed.  HENT:  Head: Normocephalic and atraumatic.  Right Ear: External ear normal.  Left Ear: External ear normal.  Nose: Mucosal edema present.  Mouth/Throat: Oropharynx is clear and  moist.  Eyes: Conjunctivae and EOM are normal.  Neck: Normal range of motion. Neck supple. Thyromegaly present.  Cardiovascular: Normal rate, regular rhythm, S1 normal, S2 normal, normal heart sounds, intact distal pulses and normal pulses.  Exam reveals no decreased pulses.   Pulmonary/Chest: Effort normal and breath sounds normal.  Abdominal: Soft. Bowel sounds are normal.  Neurological: He is alert and oriented to person, place, and time.  Skin: Skin is warm and dry.  Psychiatric: He has a normal mood  and affect. His behavior is normal. Judgment and thought content normal.       BP 123/79 (BP Location: Left Arm, Patient Position: Sitting, Cuff Size: Normal)   Pulse 79   Temp 98.1 F (36.7 C) (Oral)   Resp 16   Ht 5\' 9"  (1.753 m)   Wt 170 lb (77.1 kg)   SpO2 100%   BMI 25.10 kg/m  Assessment & Plan:   1. Chronic allergic rhinitis due to pollen, unspecified seasonality Will start a trial of Xyzal for allergic rhinitis. Avoid allergens as discussed.  - levocetirizine (XYZAL) 5 MG tablet; Take 1 tablet (5 mg total) by mouth every evening.  Dispense: 30 tablet; Refill: 11  2. Other fatigue Recommend a lowfat, low carbohydrate diet divided over 5-6 small meals, increase water intake to 6-8 glasses, and 150 minutes per week of cardiovascular exercise.   - TSH - HgB A1c - CBC with Differential - COMPLETE METABOLIC PANEL WITH GFR  3. Need for Tdap vaccination  - Tdap vaccine greater than or equal to 7yo IM  4. Colon cancer screening  - Ambulatory referral to Gastroenterology    RTC: 3 months for prostate exam and PSA Will also check fasting cholesterol panel during that time.     Dorena Dew, FNP

## 2016-04-21 NOTE — Patient Instructions (Addendum)
Allergic Rhinitis Allergic rhinitis is when the mucous membranes in the nose respond to allergens. Allergens are particles in the air that cause your body to have an allergic reaction. This causes you to release allergic antibodies. Through a chain of events, these eventually cause you to release histamine into the blood stream. Although meant to protect the body, it is this release of histamine that causes your discomfort, such as frequent sneezing, congestion, and an itchy, runny nose. What are the causes? Seasonal allergic rhinitis (hay fever) is caused by pollen allergens that may come from grasses, trees, and weeds. Year-round allergic rhinitis (perennial allergic rhinitis) is caused by allergens such as house dust mites, pet dander, and mold spores. What are the signs or symptoms?  Nasal stuffiness (congestion).  Itchy, runny nose with sneezing and tearing of the eyes. How is this diagnosed? Your health care provider can help you determine the allergen or allergens that trigger your symptoms. If you and your health care provider are unable to determine the allergen, skin or blood testing may be used. Your health care provider will diagnose your condition after taking your health history and performing a physical exam. Your health care provider may assess you for other related conditions, such as asthma, pink eye, or an ear infection. How is this treated? Allergic rhinitis does not have a cure, but it can be controlled by:  Medicines that block allergy symptoms. These may include allergy shots, nasal sprays, and oral antihistamines.  Avoiding the allergen. Hay fever may often be treated with antihistamines in pill or nasal spray forms. Antihistamines block the effects of histamine. There are over-the-counter medicines that may help with nasal congestion and swelling around the eyes. Check with your health care provider before taking or giving this medicine. If avoiding the allergen or the  medicine prescribed do not work, there are many new medicines your health care provider can prescribe. Stronger medicine may be used if initial measures are ineffective. Desensitizing injections can be used if medicine and avoidance does not work. Desensitization is when a patient is given ongoing shots until the body becomes less sensitive to the allergen. Make sure you follow up with your health care provider if problems continue. Follow these instructions at home: It is not possible to completely avoid allergens, but you can reduce your symptoms by taking steps to limit your exposure to them. It helps to know exactly what you are allergic to so that you can avoid your specific triggers. Contact a health care provider if:  You have a fever.  You develop a cough that does not stop easily (persistent).  You have shortness of breath.  You start wheezing.  Symptoms interfere with normal daily activities. This information is not intended to replace advice given to you by your health care provider. Make sure you discuss any questions you have with your health care provider. Document Released: 11/05/2000 Document Revised: 10/12/2015 Document Reviewed: 10/18/2012 Elsevier Interactive Patient Education  2017 [place] Hormone Test Why am I having this test? A thyroid-stimulating hormone (TSH) test is a blood test that is done to measure the level of TSH, also known as thyrotropin, in your blood. TSH is produced by the pituitary gland. The pituitary gland is a small organ located just below the brain, behind your eyes and nasal passages. It is part of a system that monitors and maintains thyroid hormone levels and thyroid gland function. Thyroid hormones affect many body parts and systems, including the system that affects  how quickly your body burns fuel for energy. Your health care provider may recommend testing your TSH level if you have signs and symptoms of abnormal thyroid  hormone levels. Knowing the level of TSH in your blood can help your health care provider:  Diagnose a thyroid gland or pituitary gland disorder.  Manage your condition and treatment if you have hypothyroidism or hyperthyroidism. What kind of sample is taken? A blood sample is required for this test. It is usually collected by inserting a needle into a vein. How do I prepare for this test? There is no preparation required for this test. What are the reference ranges? Reference rangesare considered healthy rangesestablished after testing a large group of healthy people. Reference rangesmay vary among different people, labs, and hospitals. It is your responsibility to obtain your test results. Ask the lab or department performing the test when and how you will get your results. Range of Normal Values:  Adult: 0.3-5 microunits/mL or 0.3-5 milliunits/L (SI units).  Newborn: 74-18 microunits/mL or 3-18 milliunits/L.  Cord: 3-12 microunits/mL or 3-12 milliunits/L. What do the results mean? A high level of TSH may mean:  Your thyroid gland is not making enough thyroid hormones. When the thyroid gland does not make enough thyroid hormones, the pituitary gland releases TSH into the bloodstream. The higher-than-normal levels of TSH prompt the thyroid gland to release more thyroid hormones.  You are getting an insufficient level of thyroid hormone medicine, if you are receiving this type of treatment.  There is a problem with the pituitary gland (rare). A low level of TSH can indicate a problem with the pituitary gland. Talk with your health care provider to discuss your results, treatment options, and if necessary, the need for more tests. Talk with your health care provider if you have any questions about your results. Talk with your health care provider to discuss your results, treatment options, and if necessary, the need for more tests. Talk with your health care provider if you have any  questions about your results. This information is not intended to replace advice given to you by your health care provider. Make sure you discuss any questions you have with your health care provider. Document Released: 03/07/2004 Document Revised: 10/14/2015 Document Reviewed: 07/06/2013 Elsevier Interactive Patient Education  2017 [employer].

## 2016-04-23 LAB — T3, FREE: T3 FREE: 3.4 pg/mL (ref 2.3–4.2)

## 2016-04-23 LAB — T4: T4, Total: 4 ug/dL — ABNORMAL LOW (ref 4.5–12.0)

## 2016-04-24 ENCOUNTER — Other Ambulatory Visit: Payer: Self-pay | Admitting: Family Medicine

## 2016-04-24 DIAGNOSIS — J301 Allergic rhinitis due to pollen: Secondary | ICD-10-CM | POA: Diagnosis not present

## 2016-04-24 DIAGNOSIS — E039 Hypothyroidism, unspecified: Secondary | ICD-10-CM

## 2016-04-24 DIAGNOSIS — J3081 Allergic rhinitis due to animal (cat) (dog) hair and dander: Secondary | ICD-10-CM | POA: Diagnosis not present

## 2016-04-24 DIAGNOSIS — J3089 Other allergic rhinitis: Secondary | ICD-10-CM | POA: Diagnosis not present

## 2016-04-25 ENCOUNTER — Telehealth: Payer: Self-pay

## 2016-04-25 ENCOUNTER — Other Ambulatory Visit: Payer: Self-pay | Admitting: Family Medicine

## 2016-04-25 DIAGNOSIS — E039 Hypothyroidism, unspecified: Secondary | ICD-10-CM

## 2016-04-25 MED ORDER — LEVOTHYROXINE SODIUM 25 MCG PO CAPS: 1.0000 | ORAL_CAPSULE | Freq: Every day | ORAL | 1 refills | Status: DC

## 2016-04-25 MED ORDER — LEVOTHYROXINE SODIUM 25 MCG PO TABS: 25.0000 ug | ORAL_TABLET | Freq: Every day | ORAL | 0 refills | Status: DC

## 2016-04-25 NOTE — Progress Notes (Signed)
Meds ordered this encounter  Medications  . Levothyroxine Sodium 25 MCG CAPS    Sig: Take 1 capsule (25 mcg total) by mouth daily before breakfast.    Dispense:  60 capsule    Refill:  1

## 2016-04-25 NOTE — Telephone Encounter (Signed)
[place], Please advise. I have tried to call patient to make sure he is using his commercial insurance but no answer and no voicemail.

## 2016-04-28 DIAGNOSIS — J301 Allergic rhinitis due to pollen: Secondary | ICD-10-CM | POA: Diagnosis not present

## 2016-04-28 DIAGNOSIS — J3089 Other allergic rhinitis: Secondary | ICD-10-CM | POA: Diagnosis not present

## 2016-04-28 DIAGNOSIS — J3081 Allergic rhinitis due to animal (cat) (dog) hair and dander: Secondary | ICD-10-CM | POA: Diagnosis not present

## 2016-05-19 ENCOUNTER — Encounter: Payer: Self-pay | Admitting: Family Medicine

## 2016-05-19 DIAGNOSIS — J3081 Allergic rhinitis due to animal (cat) (dog) hair and dander: Secondary | ICD-10-CM | POA: Diagnosis not present

## 2016-05-19 DIAGNOSIS — J3089 Other allergic rhinitis: Secondary | ICD-10-CM | POA: Diagnosis not present

## 2016-05-19 DIAGNOSIS — J301 Allergic rhinitis due to pollen: Secondary | ICD-10-CM | POA: Diagnosis not present

## 2016-05-27 DIAGNOSIS — J3089 Other allergic rhinitis: Secondary | ICD-10-CM | POA: Diagnosis not present

## 2016-05-27 DIAGNOSIS — J301 Allergic rhinitis due to pollen: Secondary | ICD-10-CM | POA: Diagnosis not present

## 2016-05-27 DIAGNOSIS — J3081 Allergic rhinitis due to animal (cat) (dog) hair and dander: Secondary | ICD-10-CM | POA: Diagnosis not present

## 2016-06-02 DIAGNOSIS — J3089 Other allergic rhinitis: Secondary | ICD-10-CM | POA: Diagnosis not present

## 2016-06-02 DIAGNOSIS — J3081 Allergic rhinitis due to animal (cat) (dog) hair and dander: Secondary | ICD-10-CM | POA: Diagnosis not present

## 2016-06-02 DIAGNOSIS — J301 Allergic rhinitis due to pollen: Secondary | ICD-10-CM | POA: Diagnosis not present

## 2016-06-23 ENCOUNTER — Encounter: Payer: Self-pay | Admitting: Family Medicine

## 2016-06-23 ENCOUNTER — Other Ambulatory Visit: Payer: Federal, State, Local not specified - PPO

## 2016-06-23 ENCOUNTER — Ambulatory Visit (INDEPENDENT_AMBULATORY_CARE_PROVIDER_SITE_OTHER): Payer: Federal, State, Local not specified - PPO | Admitting: Family Medicine

## 2016-06-23 VITALS — BP 122/60 | HR 90 | Temp 98.4°F | Resp 16 | Ht 69.0 in | Wt 169.0 lb

## 2016-06-23 DIAGNOSIS — R7989 Other specified abnormal findings of blood chemistry: Secondary | ICD-10-CM

## 2016-06-23 DIAGNOSIS — R946 Abnormal results of thyroid function studies: Secondary | ICD-10-CM | POA: Diagnosis not present

## 2016-06-23 DIAGNOSIS — J0111 Acute recurrent frontal sinusitis: Secondary | ICD-10-CM

## 2016-06-23 DIAGNOSIS — J209 Acute bronchitis, unspecified: Secondary | ICD-10-CM

## 2016-06-23 LAB — TSH: TSH: 4.61 m[IU]/L — AB (ref 0.40–4.50)

## 2016-06-23 MED ORDER — BENZONATATE 100 MG PO CAPS: 100.0000 mg | ORAL_CAPSULE | Freq: Three times a day (TID) | ORAL | 0 refills | Status: DC | PRN

## 2016-06-23 MED ORDER — AMOXICILLIN-POT CLAVULANATE 875-125 MG PO TABS: 1.0000 | ORAL_TABLET | Freq: Two times a day (BID) | ORAL | 0 refills | Status: DC

## 2016-06-23 NOTE — Progress Notes (Signed)
Patient ID: Don Hurst, male    DOB: 05/04/1960, 56 y.o.   MRN: [id number]  PCP: Dorena Dew, FNP  Chief Complaint  Patient presents with  . Cough  . Nasal Congestion    Subjective:  HPI Don Hurst is a 56 y.o. male presents for cough and nasal congestion x 8 days. Medical history is significant for asthma and allergic rhinitis. Reports a 1 week history of cough with production of yellow mucus, with chest tightness, and  nasal congestion with facial pain. Felt warm last night, no measurable fever. Albuterol inhaler self -administered  multiple times over the last 24 hours which improved chest tightness.Denies any associated wheezing, soreness throat,  or shortness of breath. He taken over the counter cough and cold agents without relief. He is no longer taking the Xyzal for seasonal allergy treatment as he felt the medication did not work.   Social History   Social History  . Marital status: Married    Spouse name: N/A  . Number of children: N/A  . Years of education: N/A   Occupational History  . Not on file.   Social History Main Topics  . Smoking status: Former Smoker    Packs/day: 0.25    Years: 3.00    Types: Cigarettes    Quit date: 03/26/1991  . Smokeless tobacco: Never Used     Comment: smoked 1 pk every 2 wks  . Alcohol use No  . Drug use: No  . Sexual activity: Yes     Comment: number of sex partners in the last 12 months  1   Other Topics Concern  . Not on file   Social History Narrative   Exercise doing weights 4 times per week for 30 minutes    Family History  Problem Relation Age of Onset  . Hypertension Mother     per pt dx around 55 y o  . Cancer Father 58    colon  . Eczema Daughter   . Hypertension Sister    Review of Systems See HPI Patient Active Problem List   Diagnosis Date Noted  . 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 IBS (irritable bowel syndrome) 06/26/2011  . Degenerative joint  disease involving multiple joints 06/26/2011  . Family history of colon cancer 06/26/2011  . Allergic rhinitis 06/26/2011  . Hx of gastroesophageal reflux (GERD) 06/26/2011    Allergies  Allergen Reactions  . Lactose Intolerance (Gi)     Prior to Admission medications   Medication Sig Start Date End Date Taking? Authorizing Provider  azelastine (ASTELIN) 0.1 % nasal spray Place 1 spray into both nostrils 2 (two) times daily. Use in each nostril as directed    Yes Historical Provider, MD  Cholecalciferol (VITAMIN D PO) Take 1 tablet by mouth daily.    Yes Historical Provider, MD  levocetirizine (XYZAL) 5 MG tablet Take 1 tablet (5 mg total) by mouth every evening. 04/21/16  Yes Dorena Dew, FNP  levothyroxine (SYNTHROID, LEVOTHROID) 25 MCG tablet Take 1 tablet (25 mcg total) by mouth daily before breakfast. 04/25/16  Yes Dorena Dew, FNP  montelukast (SINGULAIR) 10 MG tablet Take 10 mg by mouth at bedtime.   Yes Historical Provider, MD  omeprazole (PRILOSEC) 20 MG capsule Take 20 mg by mouth daily as needed (heartburn).    Yes Historical Provider, MD  PRESCRIPTION MEDICATION Allergy shots weekly   Yes Historical Provider, MD  PROAIR HFA 108 [phone] Base) MCG/ACT inhaler Take 2  puffs by mouth every 4 (four) hours as needed. 01/24/16  Yes Historical Provider, MD  tetrahydrozoline 0.05 % ophthalmic solution Place 2 drops into both eyes daily as needed.   Yes Historical Provider, MD  vitamin C (ASCORBIC ACID) 500 MG tablet Take 500 mg by mouth daily.   Yes Historical Provider, MD  VITAMIN E PO Take 1 tablet by mouth daily.    Yes Historical Provider, MD    Past Medical, Surgical Family and Social History reviewed and updated.    Objective:   Today's Vitals   06/23/16 1307  BP: 122/60  Pulse: 90  Resp: 16  Temp: 98.4 F (36.9 C)  TempSrc: Oral  SpO2: 98%  Weight: 169 lb (76.7 kg)  Height: 5\' 9"  (1.753 m)    Wt Readings from Last 3 Encounters:  06/23/16 169 lb (76.7 kg)   04/21/16 170 lb (77.1 kg)  03/01/16 168 lb (76.2 kg)   Physical Exam  Constitutional: He is oriented to person, place, and time. He appears well-developed and well-nourished.  HENT:  Right Ear: External ear normal.  Nose: Mucosal edema and rhinorrhea present. Right sinus exhibits frontal sinus tenderness. Left sinus exhibits frontal sinus tenderness.  Mouth/Throat: Oropharynx is clear and moist.  Eyes: Conjunctivae and EOM are normal. Pupils are equal, round, and reactive to light.  Neck: Normal range of motion. Neck supple.  Cardiovascular: Normal rate, regular rhythm, normal heart sounds and intact distal pulses.   Pulmonary/Chest: Effort normal. He has no decreased breath sounds. He has no wheezes. He has rhonchi in the right upper field and the left upper field.  Neurological: He is alert and oriented to person, place, and time.  Psychiatric: He has a normal mood and affect. His behavior is normal. Judgment and thought content normal.     Assessment & Plan:  1. Acute recurrent frontal sinusitis 2. Acute bronchitis, unspecified organism -Start Augmentin 875/125 mg twice daily for 10 days with food. -Benzonatate 100-200 mg, up to 3 times daily as needed for cough  Red Flags Discussed- Acute development of wheezing, shortness of breath, or fever develop, contact the office for further evaluation.  Carroll Sage. Kenton Kingfisher, MSN, [facility] [address] [place], [place] [address] [phone]

## 2016-06-23 NOTE — Patient Instructions (Addendum)
For Acute Bronchitis and Sinusitis   -Start Augmentin 875/125 mg twice daily with food for 10 days.  -For cough, you may take Benzonatate tablets, 1-2 tablets, up to 3 times daily as needed for cough.    Acute Bronchitis, Adult Acute bronchitis is when air tubes (bronchi) in the lungs suddenly get swollen. The condition can make it hard to breathe. It can also cause these symptoms:  A cough.  Coughing up clear, yellow, or green mucus.  Wheezing.  Chest congestion.  Shortness of breath.  A fever.  Body aches.  Chills.  A sore throat. Follow these instructions at home: Medicines   Take over-the-counter and prescription medicines only as told by your doctor.  If you were prescribed an antibiotic medicine, take it as told by your doctor. Do not stop taking the antibiotic even if you start to feel better. General instructions   Rest.  Drink enough fluids to keep your pee (urine) clear or pale yellow.  Avoid smoking and secondhand smoke. If you smoke and you need help quitting, ask your doctor. Quitting will help your lungs heal faster.  Use an inhaler, cool mist vaporizer, or humidifier as told by your doctor.  Keep all follow-up visits as told by your doctor. This is important. How is this prevented? To lower your risk of getting this condition again:  Wash your hands often with soap and water. If you cannot use soap and water, use hand sanitizer.  Avoid contact with people who have cold symptoms.  Try not to touch your hands to your mouth, nose, or eyes.  Make sure to get the flu shot every year. Contact a doctor if:  Your symptoms do not get better in 2 weeks. Get help right away if:  You cough up blood.  You have chest pain.  You have very bad shortness of breath.  You become dehydrated.  You faint (pass out) or keep feeling like you are going to pass out.  You keep throwing up (vomiting).  You have a very bad headache.  Your fever or chills  gets worse. This information is not intended to replace advice given to you by your health care provider. Make sure you discuss any questions you have with your health care provider. Document Released: 07/30/2007 Document Revised: 09/19/2015 Document Reviewed: 08/01/2015 Elsevier Interactive Patient Education  2017 [employer]  Sinusitis, Adult Sinusitis is soreness and inflammation of your sinuses. Sinuses are hollow spaces in the bones around your face. They are located:  Around your eyes.  In the middle of your forehead.  Behind your nose.  In your cheekbones. Your sinuses and nasal passages are lined with a stringy fluid (mucus). Mucus normally drains out of your sinuses. When your nasal tissues get inflamed or swollen, the mucus can get trapped or blocked so air cannot flow through your sinuses. This lets bacteria, viruses, and funguses grow, and that leads to infection. Follow these instructions at home: Medicines   Take, use, or apply over-the-counter and prescription medicines only as told by your doctor. These may include nasal sprays.  If you were prescribed an antibiotic medicine, take it as told by your doctor. Do not stop taking the antibiotic even if you start to feel better. Hydrate and Humidify   Drink enough water to keep your pee (urine) clear or pale yellow.  Use a cool mist humidifier to keep the humidity level in your home above 50%.  Breathe in steam for 10-15 minutes, 3-4 times a day  or as told by your doctor. You can do this in the bathroom while a hot shower is running.  Try not to spend time in cool or dry air. Rest   Rest as much as possible.  Sleep with your head raised (elevated).  Make sure to get enough sleep each night. General instructions   Put a warm, moist washcloth on your face 3-4 times a day or as told by your doctor. This will help with discomfort.  Wash your hands often with soap and water. If there is no soap and water, use hand  sanitizer.  Do not smoke. Avoid being around people who are smoking (secondhand smoke).  Keep all follow-up visits as told by your doctor. This is important. Contact a doctor if:  You have a fever.  Your symptoms get worse.  Your symptoms do not get better within 10 days. Get help right away if:  You have a very bad headache.  You cannot stop throwing up (vomiting).  You have pain or swelling around your face or eyes.  You have trouble seeing.  You feel confused.  Your neck is stiff.  You have trouble breathing. This information is not intended to replace advice given to you by your health care provider. Make sure you discuss any questions you have with your health care provider. Document Released: 07/30/2007 Document Revised: 10/07/2015 Document Reviewed: 12/06/2014 Elsevier Interactive Patient Education  2017 [employer].

## 2016-06-25 ENCOUNTER — Telehealth: Payer: Self-pay

## 2016-06-25 ENCOUNTER — Other Ambulatory Visit: Payer: Self-pay | Admitting: Family Medicine

## 2016-06-25 DIAGNOSIS — E039 Hypothyroidism, unspecified: Secondary | ICD-10-CM

## 2016-06-25 MED ORDER — LEVOTHYROXINE SODIUM 50 MCG PO TABS: 25.0000 ug | ORAL_TABLET | Freq: Every day | ORAL | 1 refills | Status: DC

## 2016-06-25 NOTE — Telephone Encounter (Signed)
Spoke with patient and advise him of the medication being increased

## 2016-06-25 NOTE — Telephone Encounter (Signed)
Tried to call regarding thyroid levels elevated and the need to increase thyroid medication, no answer and voicemail had not been set up yet. I will try later. Thanks!

## 2016-06-25 NOTE — Progress Notes (Signed)
Called, no answer and voicemail had not been set up yet. Will try later. Thanks!

## 2016-07-07 DIAGNOSIS — J301 Allergic rhinitis due to pollen: Secondary | ICD-10-CM | POA: Diagnosis not present

## 2016-07-07 DIAGNOSIS — J3089 Other allergic rhinitis: Secondary | ICD-10-CM | POA: Diagnosis not present

## 2016-07-07 DIAGNOSIS — J3081 Allergic rhinitis due to animal (cat) (dog) hair and dander: Secondary | ICD-10-CM | POA: Diagnosis not present

## 2016-07-14 ENCOUNTER — Ambulatory Visit: Payer: Federal, State, Local not specified - PPO | Admitting: Family Medicine

## 2016-07-15 DIAGNOSIS — J3081 Allergic rhinitis due to animal (cat) (dog) hair and dander: Secondary | ICD-10-CM | POA: Diagnosis not present

## 2016-07-15 DIAGNOSIS — J301 Allergic rhinitis due to pollen: Secondary | ICD-10-CM | POA: Diagnosis not present

## 2016-07-15 DIAGNOSIS — J3089 Other allergic rhinitis: Secondary | ICD-10-CM | POA: Diagnosis not present

## 2016-07-23 DIAGNOSIS — J3081 Allergic rhinitis due to animal (cat) (dog) hair and dander: Secondary | ICD-10-CM | POA: Diagnosis not present

## 2016-07-23 DIAGNOSIS — J3089 Other allergic rhinitis: Secondary | ICD-10-CM | POA: Diagnosis not present

## 2016-07-23 DIAGNOSIS — J301 Allergic rhinitis due to pollen: Secondary | ICD-10-CM | POA: Diagnosis not present

## 2016-07-25 ENCOUNTER — Other Ambulatory Visit: Payer: Self-pay | Admitting: Family Medicine

## 2016-07-25 DIAGNOSIS — E039 Hypothyroidism, unspecified: Secondary | ICD-10-CM

## 2016-07-25 MED ORDER — LEVOTHYROXINE SODIUM 50 MCG PO TABS: 50.0000 ug | ORAL_TABLET | Freq: Every day | ORAL | 1 refills | Status: DC

## 2016-07-28 DIAGNOSIS — J301 Allergic rhinitis due to pollen: Secondary | ICD-10-CM | POA: Diagnosis not present

## 2016-07-28 DIAGNOSIS — J3089 Other allergic rhinitis: Secondary | ICD-10-CM | POA: Diagnosis not present

## 2016-07-28 DIAGNOSIS — J3081 Allergic rhinitis due to animal (cat) (dog) hair and dander: Secondary | ICD-10-CM | POA: Diagnosis not present

## 2016-08-04 ENCOUNTER — Ambulatory Visit: Payer: Federal, State, Local not specified - PPO | Admitting: Family Medicine

## 2016-08-11 DIAGNOSIS — J3081 Allergic rhinitis due to animal (cat) (dog) hair and dander: Secondary | ICD-10-CM | POA: Diagnosis not present

## 2016-08-11 DIAGNOSIS — J3089 Other allergic rhinitis: Secondary | ICD-10-CM | POA: Diagnosis not present

## 2016-08-11 DIAGNOSIS — J301 Allergic rhinitis due to pollen: Secondary | ICD-10-CM | POA: Diagnosis not present

## 2016-08-18 ENCOUNTER — Ambulatory Visit (INDEPENDENT_AMBULATORY_CARE_PROVIDER_SITE_OTHER): Payer: Federal, State, Local not specified - PPO | Admitting: Family Medicine

## 2016-08-18 ENCOUNTER — Telehealth: Payer: Self-pay

## 2016-08-18 ENCOUNTER — Other Ambulatory Visit: Payer: Self-pay

## 2016-08-18 ENCOUNTER — Ambulatory Visit (HOSPITAL_COMMUNITY)
Admission: RE | Admit: 2016-08-18 | Discharge: 2016-08-18 | Disposition: A | Discharge status: Home / Self Care | Payer: Federal, State, Local not specified - PPO | Source: Ambulatory Visit | Attending: Family Medicine | Admitting: Family Medicine

## 2016-08-18 ENCOUNTER — Encounter: Payer: Self-pay | Admitting: Family Medicine

## 2016-08-18 VITALS — BP 123/79 | HR 78 | Temp 98.3°F | Resp 16 | Ht 69.0 in | Wt 169.0 lb

## 2016-08-18 DIAGNOSIS — E039 Hypothyroidism, unspecified: Secondary | ICD-10-CM

## 2016-08-18 DIAGNOSIS — Z1211 Encounter for screening for malignant neoplasm of colon: Secondary | ICD-10-CM | POA: Diagnosis not present

## 2016-08-18 DIAGNOSIS — J45909 Unspecified asthma, uncomplicated: Secondary | ICD-10-CM

## 2016-08-18 DIAGNOSIS — Z8042 Family history of malignant neoplasm of prostate: Secondary | ICD-10-CM | POA: Diagnosis not present

## 2016-08-18 DIAGNOSIS — Z9109 Other allergy status, other than to drugs and biological substances: Secondary | ICD-10-CM | POA: Diagnosis not present

## 2016-08-18 DIAGNOSIS — R05 Cough: Secondary | ICD-10-CM | POA: Diagnosis not present

## 2016-08-18 DIAGNOSIS — J3089 Other allergic rhinitis: Secondary | ICD-10-CM | POA: Diagnosis not present

## 2016-08-18 DIAGNOSIS — R053 Chronic cough: Secondary | ICD-10-CM

## 2016-08-18 DIAGNOSIS — Z1159 Encounter for screening for other viral diseases: Secondary | ICD-10-CM

## 2016-08-18 DIAGNOSIS — J3081 Allergic rhinitis due to animal (cat) (dog) hair and dander: Secondary | ICD-10-CM | POA: Diagnosis not present

## 2016-08-18 DIAGNOSIS — J301 Allergic rhinitis due to pollen: Secondary | ICD-10-CM | POA: Diagnosis not present

## 2016-08-18 MED ORDER — BUDESONIDE-FORMOTEROL FUMARATE 80-4.5 MCG/ACT IN AERO: 2.0000 | INHALATION_SPRAY | Freq: Two times a day (BID) | RESPIRATORY_TRACT | 3 refills | Status: DC

## 2016-08-18 NOTE — Telephone Encounter (Signed)
-----   Message from Dorena Dew, [place] sent at 08/18/2016  4:02 PM EDT ----- Regarding: lab results  Please inform Mr. Breton that chest xray showed no acute abnormalities. Will follow up by phone on tomorrow with laboratory results  Thanks ----- Message ----- From: Interface, Rad Results In Sent: 08/18/2016   3:22 PM To: Dorena Dew, FNP

## 2016-08-18 NOTE — Telephone Encounter (Signed)
Called and advised of no acute abnormalities on xray and that we will follow up with lab work as it becomes available. Patient verbalized understanding. Thanks!

## 2016-08-18 NOTE — Patient Instructions (Signed)
Moderate asthma:  Will start a trial of symbicort 80-4.5 twice daily Discussed uses for albuterol rescue inhaler (wheezing, persistent coughing, or shortness of breath)  Persistent coughing:  Will order a chest xray to screen for pathology  Hypothyroidism: Will follow up by phone after reviewing results    Healthcare maintenance:  Will return in 3 months for a complete physical exam with prostate exam.

## 2016-08-18 NOTE — Progress Notes (Signed)
Subjective:    Patient ID: Don Hurst, male    DOB: 11-14-1960, 56 y.o.   MRN: [id number]  HPI  Don Hurst, a 56 year old male with a history of asthma and hypothyroidism presents accompanied by wife [name] complaining of a persistent cough. Symptoms began several weeks ago.  The cough is persistent, non productive without wheezing, dyspnea or hemoptysis.  Cough aggravated by cold air, dust, exercise and pollens.  Associated symptoms include:postnasal drip. Patient has a history of moderate asthma and environmental allergies. He denies a smoking history.  He  Is also here for evaluation of possible allergic rhinitis. Patient's symptoms include itchy nose, postnasal drip and watery eyes. These symptoms are perennial. Current triggers include exposure to pollens, dust, mold and weather changes.  He has been under the care of an allergist and received injections at [facility]. He also has a history of asthma, but has not had an exacerbation in many years. He will use rescue inhaler at the onset of symptoms.   Don Hurst also has a history of hypothyroidism. He was started on levothyroxine Symptoms of fatigue has improved on current medication regimen.  Past Medical History:  Diagnosis Date  . Allergy   . Arthritis   . Asthma   . GERD (gastroesophageal reflux disease)   . History of chronic bronchitis   . Multiple allergies   . Wears glasses    Social History   Social History  . Marital status: Married    Spouse name: N/A  . Number of children: N/A  . Years of education: N/A   Occupational History  . Not on file.   Social History Main Topics  . Smoking status: Former Smoker    Packs/day: 0.25    Years: 3.00    Types: Cigarettes    Quit date: 03/26/1991  . Smokeless tobacco: Never Used     Comment: smoked 1 pk every 2 wks  . Alcohol use No  . Drug use: No  . Sexual activity: Yes     Comment: number of sex partners in the last 12 months  1   Other Topics  Concern  . Not on file   Social History Narrative   Exercise doing weights 4 times per week for 30 minutes   Review of Systems  Constitutional: Negative for unexpected weight change.  HENT: Negative.   Eyes: Negative.   Cardiovascular: Negative.  Negative for palpitations and leg swelling.  Gastrointestinal: Negative.  Negative for blood in stool and constipation.       History of IBS diarrhea  Endocrine: Negative.  Negative for polydipsia, polyphagia and polyuria.  Genitourinary: Negative.   Musculoskeletal: Negative.   Skin: Negative.   Allergic/Immunologic: Negative.  Negative for immunocompromised state.  Neurological: Negative.  Negative for dizziness.  Hematological: Negative.   Psychiatric/Behavioral: Negative.        Objective:   Physical Exam  Constitutional: He is oriented to person, place, and time. He appears well-developed.  HENT:  Head: Normocephalic and atraumatic.  Right Ear: External ear normal.  Left Ear: External ear normal.  Nose: Mucosal edema present.  Mouth/Throat: Oropharynx is clear and moist.  Eyes: Conjunctivae and EOM are normal.  Neck: Normal range of motion. Neck supple. Thyromegaly present.  Cardiovascular: Normal rate, regular rhythm, S1 normal, S2 normal, normal heart sounds, intact distal pulses and normal pulses.   Pulmonary/Chest: Effort normal. He has no decreased breath sounds. He has rhonchi in the right upper field and  the left upper field.  Abdominal: Soft. Bowel sounds are normal.  Neurological: He is alert and oriented to person, place, and time.  Skin: Skin is warm and dry.  Psychiatric: He has a normal mood and affect. His behavior is normal. Judgment and thought content normal.       BP 123/79 (BP Location: Left Arm, Patient Position: Sitting, Cuff Size: Normal)   Pulse 78   Temp 98.3 F (36.8 C) (Oral)   Resp 16   Ht 5\' 9"  (1.753 m)   Wt 169 lb (76.7 kg)   SpO2 100%   BMI 24.96 kg/m  Assessment & Plan:  1. Moderate  asthma without complication, unspecified whether persistent Asthma controller medication was prescribed due to persistent cough Will start a trial of symbicort 80-4.5 mcg 2 puffs twice daily.  - DG Chest 2 View; Future  2. Screening for colon cancer - Cologuard  3. Family history of prostate cancer Will return for a CPE with prostate exam in 3 months.   4. Hypothyroidism, unspecified type - Thyroid Panel With TSH  5. Persistent cough  - Chlorphen-PE-Acetaminophen (CORICIDIN D COLD/FLU/SINUS PO); Take by mouth.  6. Environmental allergies - Chlorphen-PE-Acetaminophen (CORICIDIN D COLD/FLU/SINUS PO); Take by mouth.  7. Need for hepatitis C screening test  - Hepatitis C Antibody   RTC: 3 months for a complete physical exam with PSA    [name]  MSN, FNP-C [place], [place] [address] [phone]

## 2016-08-18 NOTE — Telephone Encounter (Signed)
This has been sent into correct pharmacy. Thanks!

## 2016-08-19 LAB — THYROID PANEL WITH TSH
Free Thyroxine Index: 1.7 (ref 1.4–3.8)
T3 UPTAKE: 39 % — AB (ref 22–35)
T4 TOTAL: 4.3 ug/dL — AB (ref 4.5–12.0)
TSH: 1.64 mIU/L (ref 0.40–4.50)

## 2016-08-19 LAB — HEPATITIS C ANTIBODY: HCV Ab: NEGATIVE

## 2016-08-20 ENCOUNTER — Other Ambulatory Visit: Payer: Self-pay | Admitting: Family Medicine

## 2016-08-20 DIAGNOSIS — E039 Hypothyroidism, unspecified: Secondary | ICD-10-CM

## 2016-08-20 MED ORDER — LEVOTHYROXINE SODIUM 50 MCG PO TABS
50.0000 ug | ORAL_TABLET | Freq: Every day | ORAL | 5 refills | Status: DC
Start: 2016-08-20 — End: 2017-11-23

## 2016-08-20 NOTE — Progress Notes (Signed)
Patient called back and I advised to continue the levothyroxine 36mcg daily as directed. Patient informed me that the cough has greatly improved and he will keep his appointment in September. Thanks!

## 2016-08-20 NOTE — Progress Notes (Signed)
Reviewed thyroid studies, will continue levothyroxine at 50 mcg. Will repeat thyroid studies at appointment scheduled on November 18, 2016.  Meds ordered this encounter  Medications  . levothyroxine (SYNTHROID, LEVOTHROID) 50 MCG tablet    Sig: Take 1 tablet (50 mcg total) by mouth daily before breakfast.    Dispense:  30 tablet    Refill:  [place]  MSN, FNP-C [place] [address] [place], [place] [address] [phone]

## 2016-08-20 NOTE — Progress Notes (Signed)
Called, no answer. Will try later. Thanks!  

## 2016-09-15 DIAGNOSIS — J301 Allergic rhinitis due to pollen: Secondary | ICD-10-CM | POA: Diagnosis not present

## 2016-09-15 DIAGNOSIS — J3081 Allergic rhinitis due to animal (cat) (dog) hair and dander: Secondary | ICD-10-CM | POA: Diagnosis not present

## 2016-09-15 DIAGNOSIS — J3089 Other allergic rhinitis: Secondary | ICD-10-CM | POA: Diagnosis not present

## 2016-09-21 ENCOUNTER — Encounter: Payer: Self-pay | Admitting: Family Medicine

## 2016-09-22 ENCOUNTER — Ambulatory Visit: Payer: Federal, State, Local not specified - PPO | Admitting: Family Medicine

## 2016-09-22 ENCOUNTER — Other Ambulatory Visit: Payer: Self-pay

## 2016-09-22 MED ORDER — PROAIR HFA 108 (90 BASE) MCG/ACT IN AERS: 2.0000 | INHALATION_SPRAY | RESPIRATORY_TRACT | 2 refills | Status: DC | PRN

## 2016-09-22 NOTE — Telephone Encounter (Signed)
Refill for pro-air sent into pharmacy. Thanks!  

## 2016-09-23 DIAGNOSIS — J3081 Allergic rhinitis due to animal (cat) (dog) hair and dander: Secondary | ICD-10-CM | POA: Diagnosis not present

## 2016-09-23 DIAGNOSIS — J3089 Other allergic rhinitis: Secondary | ICD-10-CM | POA: Diagnosis not present

## 2016-09-23 DIAGNOSIS — J301 Allergic rhinitis due to pollen: Secondary | ICD-10-CM | POA: Diagnosis not present

## 2016-09-29 DIAGNOSIS — J301 Allergic rhinitis due to pollen: Secondary | ICD-10-CM | POA: Diagnosis not present

## 2016-09-29 DIAGNOSIS — J3089 Other allergic rhinitis: Secondary | ICD-10-CM | POA: Diagnosis not present

## 2016-09-29 DIAGNOSIS — J3081 Allergic rhinitis due to animal (cat) (dog) hair and dander: Secondary | ICD-10-CM | POA: Diagnosis not present

## 2016-10-13 DIAGNOSIS — J301 Allergic rhinitis due to pollen: Secondary | ICD-10-CM | POA: Diagnosis not present

## 2016-10-13 DIAGNOSIS — J3089 Other allergic rhinitis: Secondary | ICD-10-CM | POA: Diagnosis not present

## 2016-10-13 DIAGNOSIS — J3081 Allergic rhinitis due to animal (cat) (dog) hair and dander: Secondary | ICD-10-CM | POA: Diagnosis not present

## 2016-11-13 DIAGNOSIS — J3081 Allergic rhinitis due to animal (cat) (dog) hair and dander: Secondary | ICD-10-CM | POA: Diagnosis not present

## 2016-11-13 DIAGNOSIS — J3089 Other allergic rhinitis: Secondary | ICD-10-CM | POA: Diagnosis not present

## 2016-11-13 DIAGNOSIS — J301 Allergic rhinitis due to pollen: Secondary | ICD-10-CM | POA: Diagnosis not present

## 2016-11-18 ENCOUNTER — Encounter: Payer: Self-pay | Admitting: Family Medicine

## 2016-11-18 ENCOUNTER — Ambulatory Visit (INDEPENDENT_AMBULATORY_CARE_PROVIDER_SITE_OTHER): Payer: Federal, State, Local not specified - PPO | Admitting: Family Medicine

## 2016-11-18 VITALS — BP 130/78 | HR 100 | Temp 98.2°F | Resp 14 | Ht 69.0 in | Wt 168.8 lb

## 2016-11-18 DIAGNOSIS — J3081 Allergic rhinitis due to animal (cat) (dog) hair and dander: Secondary | ICD-10-CM | POA: Diagnosis not present

## 2016-11-18 DIAGNOSIS — Z136 Encounter for screening for cardiovascular disorders: Secondary | ICD-10-CM | POA: Diagnosis not present

## 2016-11-18 DIAGNOSIS — J42 Unspecified chronic bronchitis: Secondary | ICD-10-CM

## 2016-11-18 DIAGNOSIS — Z131 Encounter for screening for diabetes mellitus: Secondary | ICD-10-CM | POA: Diagnosis not present

## 2016-11-18 DIAGNOSIS — J301 Allergic rhinitis due to pollen: Secondary | ICD-10-CM | POA: Diagnosis not present

## 2016-11-18 DIAGNOSIS — Z125 Encounter for screening for malignant neoplasm of prostate: Secondary | ICD-10-CM | POA: Diagnosis not present

## 2016-11-18 DIAGNOSIS — J3089 Other allergic rhinitis: Secondary | ICD-10-CM | POA: Diagnosis not present

## 2016-11-18 DIAGNOSIS — Z1329 Encounter for screening for other suspected endocrine disorder: Secondary | ICD-10-CM

## 2016-11-18 DIAGNOSIS — Z0000 Encounter for general adult medical examination without abnormal findings: Secondary | ICD-10-CM

## 2016-11-18 DIAGNOSIS — M542 Cervicalgia: Secondary | ICD-10-CM

## 2016-11-18 LAB — POCT URINALYSIS DIP (DEVICE)
Bilirubin Urine: NEGATIVE
Glucose, UA: NEGATIVE mg/dL
Hgb urine dipstick: NEGATIVE
Leukocytes, UA: NEGATIVE
Nitrite: NEGATIVE
PH: 6 (ref 5.0–8.0)
Protein, ur: NEGATIVE mg/dL
Specific Gravity, Urine: 1.02 (ref 1.005–1.030)
UROBILINOGEN UA: 0.2 mg/dL (ref 0.0–1.0)

## 2016-11-18 NOTE — Patient Instructions (Addendum)
You will be notified of any abnormal lab results and changes to current treatment plan.    Fat and Cholesterol Restricted Diet Getting too much fat and cholesterol in your diet may cause health problems. Following this diet helps keep your fat and cholesterol at normal levels. This can keep you from getting sick. What types of fat should I choose?  Choose monosaturated and polyunsaturated fats. These are found in foods such as olive oil, canola oil, flaxseeds, walnuts, almonds, and seeds.  Eat more omega-3 fats. Good choices include salmon, mackerel, sardines, tuna, flaxseed oil, and ground flaxseeds.  Limit saturated fats. These are in animal products such as meats, butter, and cream. They can also be in plant products such as palm oil, palm kernel oil, and coconut oil.  Avoid foods with partially hydrogenated oils in them. These contain trans fats. Examples of foods that have trans fats are stick margarine, some tub margarines, cookies, crackers, and other baked goods. What general guidelines do I need to follow?  Check food labels. Look for the words "trans fat" and "saturated fat."  When preparing a meal: ? Fill half of your plate with vegetables and green salads. ? Fill one fourth of your plate with whole grains. Look for the word "whole" as the first word in the ingredient list. ? Fill one fourth of your plate with lean protein foods.  Eat more foods that have fiber, like apples, carrots, beans, peas, and barley.  Eat more home-cooked foods. Eat less at restaurants and buffets.  Limit or avoid alcohol.  Limit foods high in starch and sugar.  Limit fried foods.  Cook foods without frying them. Baking, boiling, grilling, and broiling are all great options.  Lose weight if you are overweight. Losing even a small amount of weight can help your overall health. It can also help prevent diseases such as diabetes and heart disease. What foods can I eat? Grains Whole grains, such  as whole wheat or whole grain breads, crackers, cereals, and pasta. Unsweetened oatmeal, bulgur, barley, quinoa, or brown rice. Corn or whole wheat flour tortillas. Vegetables Fresh or frozen vegetables (raw, steamed, roasted, or grilled). Green salads. Fruits All fresh, canned (in natural juice), or frozen fruits. Meat and Other Protein Products Ground beef (85% or leaner), grass-fed beef, or beef trimmed of fat. Skinless chicken or [place]. Ground chicken or [place]. Pork trimmed of fat. All fish and seafood. Eggs. Dried beans, peas, or lentils. Unsalted nuts or seeds. Unsalted canned or dry beans. Dairy Low-fat dairy products, such as skim or 1% milk, 2% or reduced-fat cheeses, low-fat ricotta or cottage cheese, or plain low-fat yogurt. Fats and Oils Tub margarines without trans fats. Light or reduced-fat mayonnaise and salad dressings. Avocado. Olive, canola, sesame, or safflower oils. Natural peanut or almond butter (choose ones without added sugar and oil). The items listed above may not be a complete list of recommended foods or beverages. Contact your dietitian for more options. What foods are not recommended? Grains White bread. White pasta. White rice. Cornbread. Bagels, pastries, and croissants. Crackers that contain trans fat. Vegetables White potatoes. Corn. Creamed or fried vegetables. Vegetables in a cheese sauce. Fruits Dried fruits. Canned fruit in light or heavy syrup. Fruit juice. Meat and Other Protein Products Fatty cuts of meat. Ribs, chicken wings, bacon, sausage, bologna, salami, chitterlings, fatback, hot dogs, bratwurst, and packaged luncheon meats. Liver and organ meats. Dairy Whole or 2% milk, cream, half-and-half, and cream cheese. Whole milk cheeses. Whole-fat or sweetened yogurt.  Full-fat cheeses. Nondairy creamers and whipped toppings. Processed cheese, cheese spreads, or cheese curds. Sweets and Desserts Corn syrup, sugars, honey, and molasses. Candy. Jam and  jelly. Syrup. Sweetened cereals. Cookies, pies, cakes, donuts, muffins, and ice cream. Fats and Oils Butter, stick margarine, lard, shortening, ghee, or bacon fat. Coconut, palm kernel, or palm oils. Beverages Alcohol. Sweetened drinks (such as sodas, lemonade, and fruit drinks or punches). The items listed above may not be a complete list of foods and beverages to avoid. Contact your dietitian for more information. This information is not intended to replace advice given to you by your health care provider. Make sure you discuss any questions you have with your health care provider. Document Released: 08/12/2011 Document Revised: 10/18/2015 Document Reviewed: 05/12/2013 Elsevier Interactive Patient Education  2018 [place] Heart-healthy meal planning includes:  Limiting unhealthy fats.  Increasing healthy fats.  Making other small dietary changes.  You may need to talk with your doctor or a diet specialist (dietitian) to create an eating plan that is right for you. What types of fat should I choose?  Choose healthy fats. These include olive oil and canola oil, flaxseeds, walnuts, almonds, and seeds.  Eat more omega-3 fats. These include salmon, mackerel, sardines, tuna, flaxseed oil, and ground flaxseeds. Try to eat fish at least twice each week.  Limit saturated fats. ? Saturated fats are often found in animal products, such as meats, butter, and cream. ? Plant sources of saturated fats include palm oil, palm kernel oil, and coconut oil.  Avoid foods with partially hydrogenated oils in them. These include stick margarine, some tub margarines, cookies, crackers, and other baked goods. These contain trans fats. What general guidelines do I need to follow?  Check food labels carefully. Identify foods with trans fats or high amounts of saturated fat.  Fill one half of your plate with vegetables and green salads. Eat 4-5 servings of vegetables per day.  A serving of vegetables is: ? 1 cup of raw leafy vegetables. ?  cup of raw or cooked cut-up vegetables. ?  cup of vegetable juice.  Fill one fourth of your plate with whole grains. Look for the word "whole" as the first word in the ingredient list.  Fill one fourth of your plate with lean protein foods.  Eat 4-5 servings of fruit per day. A serving of fruit is: ? One medium whole fruit. ?  cup of dried fruit. ?  cup of fresh, frozen, or canned fruit. ?  cup of 100% fruit juice.  Eat more foods that contain soluble fiber. These include apples, broccoli, carrots, beans, peas, and barley. Try to get 20-30 g of fiber per day.  Eat more home-cooked food. Eat less restaurant, buffet, and fast food.  Limit or avoid alcohol.  Limit foods high in starch and sugar.  Avoid fried foods.  Avoid frying your food. Try baking, boiling, grilling, or broiling it instead. You can also reduce fat by: ? Removing the skin from poultry. ? Removing all visible fats from meats. ? Skimming the fat off of stews, soups, and gravies before serving them. ? Steaming vegetables in water or broth.  Lose weight if you are overweight.  Eat 4-5 servings of nuts, legumes, and seeds per week: ? One serving of dried beans or legumes equals  cup after being cooked. ? One serving of nuts equals 1 ounces. ? One serving of seeds equals  ounce or one tablespoon.  You may need to  keep track of how much salt or sodium you eat. This is especially true if you have high blood pressure. Talk with your doctor or dietitian to get more information. What foods can I eat? Grains Breads, including [place], white, pita, wheat, raisin, rye, oatmeal, and [place]. Tortillas that are neither fried nor made with lard or trans fat. Low-fat rolls, including hotdog and hamburger buns and English muffins. Biscuits. Muffins. Waffles. Pancakes. Light popcorn. Whole-grain cereals. Flatbread. Melba toast. Pretzels. Breadsticks. Rusks.  Low-fat snacks. Low-fat crackers, including oyster, saltine, matzo, graham, animal, and rye. Rice and pasta, including brown rice and pastas that are made with whole wheat. Vegetables All vegetables. Fruits All fruits, but limit coconut. Meats and Other Protein Sources Lean, well-trimmed beef, veal, pork, and lamb. Chicken and [place] without skin. All fish and shellfish. Wild duck, rabbit, pheasant, and venison. Egg whites or low-cholesterol egg substitutes. Dried beans, peas, lentils, and tofu. Seeds and most nuts. Dairy Low-fat or nonfat cheeses, including ricotta, string, and mozzarella. Skim or 1% milk that is liquid, powdered, or evaporated. Buttermilk that is made with low-fat milk. Nonfat or low-fat yogurt. Beverages Mineral water. Diet carbonated beverages. Sweets and Desserts Sherbets and fruit ices. Honey, jam, marmalade, jelly, and syrups. Meringues and gelatins. Pure sugar candy, such as hard candy, jelly beans, gumdrops, mints, marshmallows, and small amounts of dark chocolate. [employer] Eat all sweets and desserts in moderation. Fats and Oils Nonhydrogenated (trans-free) margarines. Vegetable oils, including soybean, sesame, sunflower, olive, peanut, safflower, corn, canola, and cottonseed. Salad dressings or mayonnaise made with a vegetable oil. Limit added fats and oils that you use for cooking, baking, salads, and as spreads. Other Cocoa powder. Coffee and tea. All seasonings and condiments. The items listed above may not be a complete list of recommended foods or beverages. Contact your dietitian for more options. What foods are not recommended? Grains Breads that are made with saturated or trans fats, oils, or whole milk. Croissants. Butter rolls. Cheese breads. Sweet rolls. Donuts. Buttered popcorn. Chow mein noodles. High-fat crackers, such as cheese or butter crackers. Meats and Other Protein Sources Fatty meats, such as hotdogs, short ribs, sausage, spareribs,  bacon, rib eye roast or steak, and mutton. High-fat deli meats, such as salami and bologna. Caviar. Domestic duck and goose. Organ meats, such as kidney, liver, sweetbreads, and heart. Dairy Cream, sour cream, cream cheese, and creamed cottage cheese. Whole-milk cheeses, including blue (bleu), [place], [place], [place], [place], [place], Swiss, cheddar, [place], and [place]. Whole or 2% milk that is liquid, evaporated, or condensed. Whole buttermilk. Cream sauce or high-fat cheese sauce. Yogurt that is made from whole milk. Beverages Regular sodas and juice drinks with added sugar. Sweets and Desserts Frosting. Pudding. Cookies. Cakes other than angel food cake. Candy that has milk chocolate or white chocolate, hydrogenated fat, butter, coconut, or unknown ingredients. Buttered syrups. Full-fat ice cream or ice cream drinks. Fats and Oils Gravy that has suet, meat fat, or shortening. Cocoa butter, hydrogenated oils, palm oil, coconut oil, palm kernel oil. These can often be found in baked products, candy, fried foods, nondairy creamers, and whipped toppings. Solid fats and shortenings, including bacon fat, salt pork, lard, and butter. Nondairy cream substitutes, such as coffee creamers and sour cream substitutes. Salad dressings that are made of unknown oils, cheese, or sour cream. The items listed above may not be a complete list of foods and beverages to avoid. Contact your dietitian for more information. This information is not intended to replace  advice given to you by your health care provider. Make sure you discuss any questions you have with your health care provider. Document Released: 08/12/2011 Document Revised: 07/19/2015 Document Reviewed: 08/04/2013 Elsevier Interactive Patient Education  [employer].

## 2016-11-18 NOTE — Progress Notes (Signed)
Patient ID: Don Hurst, male    DOB: 07-13-1960, 56 y.o.   MRN: [id number]  PCP: Dorena Dew, FNP  Chief Complaint  Patient presents with  . Annual Exam    Subjective:  HPI Don Hurst is a 56 y.o. male presents for routine physical. He has one complaint of neck discomfort which he reports is not painful. He denies any associated difficulty swallowing, breathing, or talking. His medical history is significant for GERD, IBS, allergic rhinitis. He was recently placed on Symbicort for management of wheezing and he reports medication compliance and significant relief of symptoms. Social History   Social History  . Marital status: Married    Spouse name: N/A  . Number of children: N/A  . Years of education: N/A   Occupational History  . Not on file.   Social History Main Topics  . Smoking status: Former Smoker    Packs/day: 0.25    Years: 3.00    Types: Cigarettes    Quit date: 03/26/1991  . Smokeless tobacco: Never Used     Comment: smoked 1 pk every 2 wks  . Alcohol use No  . Drug use: No  . Sexual activity: Yes     Comment: number of sex partners in the last 12 months  1   Other Topics Concern  . Not on file   Social History Narrative   Exercise doing weights 4 times per week for 30 minutes    Family History  Problem Relation Age of Onset  . Hypertension Mother        per pt dx around 70 y o  . Cancer Father 25       colon  . Eczema Daughter   . Hypertension Sister      Review of Systems  Constitutional: Negative.   HENT: Positive for congestion, rhinorrhea and sneezing. Negative for trouble swallowing and voice change.        Chronic URI symptoms-none at present  Eyes: Negative.   Respiratory: Negative.   Cardiovascular: Negative.   Gastrointestinal: Negative.   Endocrine: Negative.   Genitourinary: Negative.   Musculoskeletal: Negative.   Skin: Negative.   Allergic/Immunologic: Negative.   Neurological: Negative.   Psychiatric/Behavioral:  Negative.     Patient Active Problem List   Diagnosis Date Noted  . 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 IBS (irritable bowel syndrome) 06/26/2011  . Degenerative joint disease involving multiple joints 06/26/2011  . Family history of colon cancer 06/26/2011  . Allergic rhinitis 06/26/2011  . Hx of gastroesophageal reflux (GERD) 06/26/2011    Allergies  Allergen Reactions  . Lactose Intolerance (Gi)     Prior to Admission medications   Medication Sig Start Date End Date Taking? Authorizing Provider  azelastine (ASTELIN) 0.1 % nasal spray Place 1 spray into both nostrils 2 (two) times daily. Use in each nostril as directed    Yes [provider]  budesonide-formoterol (SYMBICORT) 80-4.5 MCG/ACT inhaler Inhale 2 puffs into the lungs 2 (two) times daily. 08/18/16  Yes Dorena Dew, FNP  Chlorphen-PE-Acetaminophen (CORICIDIN D COLD/FLU/SINUS PO) Take by mouth.   Yes [provider]  Cholecalciferol (VITAMIN D PO) Take 1 tablet by mouth daily.    Yes [provider]  levothyroxine (SYNTHROID, LEVOTHROID) 50 MCG tablet Take 1 tablet (50 mcg total) by mouth daily before breakfast. 08/20/16  Yes Dorena Dew, FNP  montelukast (SINGULAIR) 10 MG tablet Take 10 mg by mouth at bedtime.  Yes [provider]  omeprazole (PRILOSEC) 20 MG capsule Take 20 mg by mouth daily as needed (heartburn).    Yes [provider]  PRESCRIPTION MEDICATION Allergy shots weekly   Yes [provider]  PROAIR HFA 108 (90 Base) MCG/ACT inhaler Inhale 2 puffs into the lungs every 4 (four) hours as needed. 09/22/16  Yes Dorena Dew, FNP  tetrahydrozoline 0.05 % ophthalmic solution Place 2 drops into both eyes daily as needed.   Yes [provider]  vitamin C (ASCORBIC ACID) 500 MG tablet Take 500 mg by mouth daily.   Yes [provider]  VITAMIN E PO Take 1 tablet by mouth daily.    Yes [provider]    Past Medical, Surgical Family and Social History reviewed and updated.    Objective:   Today's Vitals   11/18/16 1417  BP: 130/78  Pulse: 100  Resp: 14  Temp: 98.2 F (36.8 C)  TempSrc: Oral  SpO2: 98%  Weight: 168 lb 12.8 oz (76.6 kg)  Height: 5\' 9"  (1.753 m)    Wt Readings from Last 3 Encounters:  11/18/16 168 lb 12.8 oz (76.6 kg)  08/18/16 169 lb (76.7 kg)  06/23/16 169 lb (76.7 kg)   Physical Exam  Constitutional: He is oriented to person, place, and time. He appears well-developed and well-nourished.  HENT:  Head: Normocephalic and atraumatic.  Right Ear: External ear normal.  Left Ear: External ear normal.  Nose: Nose normal.  Mouth/Throat: Oropharynx is clear and moist.  Eyes: Pupils are equal, round, and reactive to light. Conjunctivae and EOM are normal.  Neck: Normal range of motion. Neck supple. No thyromegaly present.  Cardiovascular: Normal rate, regular rhythm, normal heart sounds and intact distal pulses.   EKG-NSR, negative for ischemia   Pulmonary/Chest: Effort normal and breath sounds normal.  Abdominal: Soft. Bowel sounds are normal.  Musculoskeletal: Normal range of motion.  Lymphadenopathy:    He has no cervical adenopathy.  Neurological: He is alert and oriented to person, place, and time.  Skin: Skin is warm and dry.  Psychiatric: He has a normal mood and affect. His behavior is normal. Judgment and thought content normal.    Assessment & Plan:  1. Physical exam 2. Chronic bronchitis, unspecified chronic bronchitis type ([place]) 3. Screening for thyroid disorder 4. Screening for prostate cancer 5. Screening for cardiovascular condition 6. Screening for diabetes mellitus 7. Neck Discomfort  -Age appropriate anticipatory guidance provided. Neck discomfort, benign occurrence for now, exam is unremarkable. Checking TSH. Patient has a history of parotid gland cancer. No enlargement of glands noted on exam. Can take tylenol or  ibuprofen if persists. Increased physical activity to recommended  150 minutes per week. Patient will be notified of any abnormal lab results.   Orders Placed This Encounter  Procedures  . COMPLETE METABOLIC PANEL WITH GFR  . Thyroid Panel With TSH  . CBC with Differential/Platelet  . PSA  . POCT glycosylated hemoglobin (Hb A1C)  . POCT urinalysis dip (device)  . EKG 12-Lead   RTC: Follow-up with PCP 6 months for routine wellness visit.   Carroll Sage. Kenton Kingfisher, MSN, FNP-C [facility]  [address] [place], [place] [address] [phone]

## 2016-11-19 LAB — COMPLETE METABOLIC PANEL WITH GFR
AG RATIO: 1.6 (calc) (ref 1.0–2.5)
ALT: 20 U/L (ref 9–46)
AST: 21 U/L (ref 10–35)
Albumin: 4.2 g/dL (ref 3.6–5.1)
Alkaline phosphatase (APISO): 84 U/L (ref 40–115)
BILIRUBIN TOTAL: 0.4 mg/dL (ref 0.2–1.2)
BUN: 12 mg/dL (ref 7–25)
CHLORIDE: 106 mmol/L (ref 98–110)
CO2: 23 mmol/L (ref 20–32)
Calcium: 8.7 mg/dL (ref 8.6–10.3)
Creat: 0.92 mg/dL (ref 0.70–1.33)
GFR, EST AFRICAN AMERICAN: 110 mL/min/{1.73_m2} (ref >=60)
GFR, Est Non African American: 95 mL/min/{1.73_m2} (ref >=60)
GLOBULIN: 2.7 g/dL (ref 1.9–3.7)
Glucose, Bld: 99 mg/dL (ref 65–99)
POTASSIUM: 4.1 mmol/L (ref 3.5–5.3)
SODIUM: 138 mmol/L (ref 135–146)
TOTAL PROTEIN: 6.9 g/dL (ref 6.1–8.1)

## 2016-11-19 LAB — CBC WITH DIFFERENTIAL/PLATELET
BASOS ABS: 29 {cells}/uL (ref 0–200)
Basophils Relative: 0.6 %
EOS ABS: 49 {cells}/uL (ref 15–500)
EOS PCT: 1 %
HCT: 43.2 % (ref 38.5–50.0)
HEMOGLOBIN: 14.2 g/dL (ref 13.2–17.1)
Lymphs Abs: 1945 cells/uL (ref 850–3900)
MCH: 30.1 pg (ref 27.0–33.0)
MCHC: 32.9 g/dL (ref 32.0–36.0)
MCV: 91.7 fL (ref 80.0–100.0)
MONOS PCT: 10.7 %
MPV: 10.8 fL (ref 7.5–12.5)
NEUTROS PCT: 48 %
Neutro Abs: 2352 cells/uL (ref 1500–7800)
PLATELETS: 235 10*3/uL (ref 140–400)
RBC: 4.71 10*6/uL (ref 4.20–5.80)
RDW: 12.2 % (ref 11.0–15.0)
TOTAL LYMPHOCYTE: 39.7 %
WBC mixed population: 524 cells/uL (ref 200–950)
WBC: 4.9 10*3/uL (ref 3.8–10.8)

## 2016-11-19 LAB — PSA: PSA: 0.8 ng/mL (ref <=4.0)

## 2016-11-19 LAB — THYROID PANEL WITH TSH
FREE THYROXINE INDEX: 1.7 (ref 1.4–3.8)
T3 Uptake: 40 % — ABNORMAL HIGH (ref 22–35)
T4, Total: 4.2 ug/dL — ABNORMAL LOW (ref 4.9–10.5)
TSH: 1.55 mIU/L (ref 0.40–4.50)

## 2016-11-25 ENCOUNTER — Telehealth: Payer: Self-pay | Admitting: Family Medicine

## 2016-11-25 DIAGNOSIS — R7989 Other specified abnormal findings of blood chemistry: Secondary | ICD-10-CM

## 2016-11-25 DIAGNOSIS — R799 Abnormal finding of blood chemistry, unspecified: Secondary | ICD-10-CM

## 2016-11-25 NOTE — Telephone Encounter (Signed)
Contact Mr. Morefield to advise his most recent labs were consistent with his normal baseline, however his thyroid panel remains abnormal. Although his physical exam was negative for abnormalities, I would like to order an ultrasound of head and neck to rule out any abnormal findings. Please schedule at patient's convenience.   Carroll Sage. Kenton Kingfisher, MSN, FNP-C [facility]  [address] [place] [place], [place] [address] [phone]

## 2016-11-25 NOTE — Telephone Encounter (Signed)
Called, no answer. Voicemail was not set up. Will try later.

## 2016-11-25 NOTE — Telephone Encounter (Signed)
Patient returned call, I advised that labs were consistent with baseline except for thyroid which remains abnormal. Informed him that provider wants an ultrasound preformed to rule out any abnormal findings. Patient verbalized understanding, I placed him on hold and scheduled the appointment for 12/09/2016 @1 :15pm. Patient was made aware of appointment and had no questions at this time. Thanks!

## 2016-12-09 ENCOUNTER — Ambulatory Visit (HOSPITAL_COMMUNITY)
Admission: RE | Admit: 2016-12-09 | Discharge: 2016-12-09 | Disposition: A | Discharge status: Home / Self Care | Payer: Federal, State, Local not specified - PPO | Source: Ambulatory Visit | Attending: Family Medicine | Admitting: Family Medicine

## 2016-12-09 DIAGNOSIS — R7989 Other specified abnormal findings of blood chemistry: Secondary | ICD-10-CM

## 2016-12-09 DIAGNOSIS — R946 Abnormal results of thyroid function studies: Secondary | ICD-10-CM | POA: Diagnosis not present

## 2016-12-09 DIAGNOSIS — R799 Abnormal finding of blood chemistry, unspecified: Secondary | ICD-10-CM | POA: Insufficient documentation

## 2016-12-11 DIAGNOSIS — J3089 Other allergic rhinitis: Secondary | ICD-10-CM | POA: Diagnosis not present

## 2016-12-11 DIAGNOSIS — J301 Allergic rhinitis due to pollen: Secondary | ICD-10-CM | POA: Diagnosis not present

## 2016-12-11 DIAGNOSIS — J3081 Allergic rhinitis due to animal (cat) (dog) hair and dander: Secondary | ICD-10-CM | POA: Diagnosis not present

## 2016-12-18 DIAGNOSIS — J3089 Other allergic rhinitis: Secondary | ICD-10-CM | POA: Diagnosis not present

## 2016-12-18 DIAGNOSIS — J3081 Allergic rhinitis due to animal (cat) (dog) hair and dander: Secondary | ICD-10-CM | POA: Diagnosis not present

## 2016-12-18 DIAGNOSIS — J301 Allergic rhinitis due to pollen: Secondary | ICD-10-CM | POA: Diagnosis not present

## 2016-12-31 DIAGNOSIS — J3089 Other allergic rhinitis: Secondary | ICD-10-CM | POA: Diagnosis not present

## 2016-12-31 DIAGNOSIS — J301 Allergic rhinitis due to pollen: Secondary | ICD-10-CM | POA: Diagnosis not present

## 2016-12-31 DIAGNOSIS — J3081 Allergic rhinitis due to animal (cat) (dog) hair and dander: Secondary | ICD-10-CM | POA: Diagnosis not present

## 2017-01-06 DIAGNOSIS — J301 Allergic rhinitis due to pollen: Secondary | ICD-10-CM | POA: Diagnosis not present

## 2017-01-06 DIAGNOSIS — J3081 Allergic rhinitis due to animal (cat) (dog) hair and dander: Secondary | ICD-10-CM | POA: Diagnosis not present

## 2017-01-06 DIAGNOSIS — J3089 Other allergic rhinitis: Secondary | ICD-10-CM | POA: Diagnosis not present

## 2017-02-11 DIAGNOSIS — J3089 Other allergic rhinitis: Secondary | ICD-10-CM | POA: Diagnosis not present

## 2017-02-11 DIAGNOSIS — J301 Allergic rhinitis due to pollen: Secondary | ICD-10-CM | POA: Diagnosis not present

## 2017-02-11 DIAGNOSIS — J3081 Allergic rhinitis due to animal (cat) (dog) hair and dander: Secondary | ICD-10-CM | POA: Diagnosis not present

## 2017-02-13 DIAGNOSIS — J3089 Other allergic rhinitis: Secondary | ICD-10-CM | POA: Diagnosis not present

## 2017-02-13 DIAGNOSIS — J3081 Allergic rhinitis due to animal (cat) (dog) hair and dander: Secondary | ICD-10-CM | POA: Diagnosis not present

## 2017-02-13 DIAGNOSIS — J301 Allergic rhinitis due to pollen: Secondary | ICD-10-CM | POA: Diagnosis not present

## 2017-03-19 ENCOUNTER — Other Ambulatory Visit: Payer: Self-pay | Admitting: Family Medicine

## 2017-03-19 DIAGNOSIS — E039 Hypothyroidism, unspecified: Secondary | ICD-10-CM

## 2017-03-23 DIAGNOSIS — J3081 Allergic rhinitis due to animal (cat) (dog) hair and dander: Secondary | ICD-10-CM | POA: Diagnosis not present

## 2017-03-23 DIAGNOSIS — J3089 Other allergic rhinitis: Secondary | ICD-10-CM | POA: Diagnosis not present

## 2017-03-23 DIAGNOSIS — J301 Allergic rhinitis due to pollen: Secondary | ICD-10-CM | POA: Diagnosis not present

## 2017-04-21 ENCOUNTER — Other Ambulatory Visit: Payer: Self-pay | Admitting: Family Medicine

## 2017-04-21 DIAGNOSIS — E039 Hypothyroidism, unspecified: Secondary | ICD-10-CM

## 2017-05-06 DIAGNOSIS — J301 Allergic rhinitis due to pollen: Secondary | ICD-10-CM | POA: Diagnosis not present

## 2017-05-07 DIAGNOSIS — J301 Allergic rhinitis due to pollen: Secondary | ICD-10-CM | POA: Diagnosis not present

## 2017-05-07 DIAGNOSIS — J3081 Allergic rhinitis due to animal (cat) (dog) hair and dander: Secondary | ICD-10-CM | POA: Diagnosis not present

## 2017-05-07 DIAGNOSIS — J3089 Other allergic rhinitis: Secondary | ICD-10-CM | POA: Diagnosis not present

## 2017-05-17 ENCOUNTER — Other Ambulatory Visit: Payer: Self-pay | Admitting: Family Medicine

## 2017-05-17 DIAGNOSIS — E039 Hypothyroidism, unspecified: Secondary | ICD-10-CM

## 2017-05-18 ENCOUNTER — Ambulatory Visit: Payer: Federal, State, Local not specified - PPO | Admitting: Family Medicine

## 2017-05-18 DIAGNOSIS — J3081 Allergic rhinitis due to animal (cat) (dog) hair and dander: Secondary | ICD-10-CM | POA: Diagnosis not present

## 2017-05-18 DIAGNOSIS — J3089 Other allergic rhinitis: Secondary | ICD-10-CM | POA: Diagnosis not present

## 2017-05-18 DIAGNOSIS — J301 Allergic rhinitis due to pollen: Secondary | ICD-10-CM | POA: Diagnosis not present

## 2017-05-20 DIAGNOSIS — J3081 Allergic rhinitis due to animal (cat) (dog) hair and dander: Secondary | ICD-10-CM | POA: Diagnosis not present

## 2017-05-20 DIAGNOSIS — J3089 Other allergic rhinitis: Secondary | ICD-10-CM | POA: Diagnosis not present

## 2017-05-20 DIAGNOSIS — J301 Allergic rhinitis due to pollen: Secondary | ICD-10-CM | POA: Diagnosis not present

## 2017-05-25 ENCOUNTER — Ambulatory Visit: Payer: Federal, State, Local not specified - PPO | Admitting: Family Medicine

## 2017-05-25 ENCOUNTER — Encounter: Payer: Self-pay | Admitting: Family Medicine

## 2017-05-25 VITALS — BP 121/76 | HR 65 | Temp 97.8°F | Resp 16 | Ht 69.0 in | Wt 169.0 lb

## 2017-05-25 DIAGNOSIS — E039 Hypothyroidism, unspecified: Secondary | ICD-10-CM

## 2017-05-25 DIAGNOSIS — J301 Allergic rhinitis due to pollen: Secondary | ICD-10-CM | POA: Diagnosis not present

## 2017-05-25 DIAGNOSIS — J3089 Other allergic rhinitis: Secondary | ICD-10-CM | POA: Diagnosis not present

## 2017-05-25 DIAGNOSIS — J3081 Allergic rhinitis due to animal (cat) (dog) hair and dander: Secondary | ICD-10-CM | POA: Diagnosis not present

## 2017-05-25 NOTE — Progress Notes (Signed)
Subjective:     Don Hurst is a 57 y.o. male who presents for follow up of hypothyroidism. Don Hurst has been taking levothyroxine consistently.  Patient denies change in energy level, diarrhea, heat / cold intolerance, nervousness, palpitations and weight changes. Symptoms have been well-controlled. Past Medical History:  Diagnosis Date  . Allergy   . Arthritis   . Asthma   . GERD (gastroesophageal reflux disease)   . History of chronic bronchitis   . Multiple allergies   . Wears glasses    Social History   Socioeconomic History  . Marital status: Married    Spouse name: Not on file  . Number of children: Not on file  . Years of education: Not on file  . Highest education level: Not on file  Occupational History  . Not on file  Social Needs  . Financial resource strain: Not on file  . Food insecurity:    Worry: Not on file    Inability: Not on file  . Transportation needs:    Medical: Not on file    Non-medical: Not on file  Tobacco Use  . Smoking status: Former Smoker    Packs/day: 0.25    Years: 3.00    Pack years: 0.75    Types: Cigarettes    Last attempt to quit: 03/26/1991    Years since quitting: 26.1  . Smokeless tobacco: Never Used  . Tobacco comment: smoked 1 pk every 2 wks  Substance and Sexual Activity  . Alcohol use: No  . Drug use: No  . Sexual activity: Yes    Comment: number of sex partners in the last 12 months  1  Lifestyle  . Physical activity:    Days per week: Not on file    Minutes per session: Not on file  . Stress: Not on file  Relationships  . Social connections:    Talks on phone: Not on file    Gets together: Not on file    Attends religious service: Not on file    Active member of club or organization: Not on file    Attends meetings of clubs or organizations: Not on file    Relationship status: Not on file  . Intimate partner violence:    Fear of current or ex partner: Not on file    Emotionally abused: Not on file   Physically abused: Not on file    Forced sexual activity: Not on file  Other Topics Concern  . Not on file  Social History Narrative   Exercise doing weights 4 times per week for 30 minutes   Immunization History  Administered Date(s) Administered  . Td 08/16/2004  . Tdap 06/26/2011, 04/21/2016   Allergies  Allergen Reactions  . Lactose Intolerance (Gi)    Review of Systems  Constitutional: Negative.   HENT: Negative.   Eyes: Negative.   Respiratory: Negative.   Cardiovascular: Negative for chest pain and palpitations.  Gastrointestinal: Negative.   Genitourinary: Negative.   Musculoskeletal: Negative.   Skin: Negative.   Neurological: Negative.   Endo/Heme/Allergies: Positive for environmental allergies.    Objective:  .BP 121/76 (BP Location: Left Arm, Patient Position: Sitting, Cuff Size: Normal)   Pulse 65   Temp 97.8 F (36.6 C) (Oral)   Resp 16   Ht 5\' 9"  (1.753 m)   Wt 169 lb (76.7 kg)   SpO2 100%   BMI 24.96 kg/m   General Appearance:    Alert, cooperative, no distress, appears stated age  Head:    Normocephalic, without obvious abnormality, atraumatic  Eyes:    PERRL, conjunctiva/corneas clear, EOM's intact, fundi    benign, both eyes       Ears:    Normal TM's and external ear canals, both ears  Nose:   Nares normal, septum midline, mucosa normal, no drainage   or sinus tenderness  Throat:   Lips, mucosa, and tongue normal; teeth and gums normal  Neck:   Supple, symmetrical, trachea midline, no adenopathy;       thyroid:  No enlargement/tenderness/nodules; no carotid   bruit or JVD  Back:     Symmetric, no curvature, ROM normal, no CVA tenderness  Lungs:     Clear to auscultation bilaterally, respirations unlabored  Chest wall:    No tenderness or deformity  Heart:    Regular rate and rhythm, S1 and S2 normal, no murmur, rub   or gallop  Abdomen:     Soft, non-tender, bowel sounds active all four quadrants,    no masses, no organomegaly  Lymph  nodes:   Cervical, supraclavicular, and axillary nodes normal  Neurologic:   CNII-XII intact. Normal strength, sensation and reflexes      throughout   Laboratory: Lab Results  Component Value Date   TSH 1.55 11/18/2016      Assessment:   Plan:  Acquired hypothyroidism - Thyroid Panel With TSH  1. L-thyroxine per orders. 2. Recheck thyroid function tests in 6 months 3. Instructed not to take multivitamins or iron within 4 hours of taking thyroid medications.   Health Maintenance:  Recommend Cologuard for colon cancer screening, completed forms Recommend yearly eye exam Dental exam twice yearly   RTC: 6 months for hypothyroidism  [name]  MSN, FNP-C Patient [facility] [address] [place], [place] [address] [phone]

## 2017-05-25 NOTE — Patient Instructions (Addendum)
  Health Maintenance:  Recommend Cologuard for colon cancer screening, completed forms Recommend yearly eye exam Dental exam twice yearly  Hypothyroidism Hypothyroidism is a disorder of the thyroid. The thyroid is a large gland that is located in the lower front of the neck. The thyroid releases hormones that control how the body works. With hypothyroidism, the thyroid does not make enough of these hormones. What are the causes? Causes of hypothyroidism may include:  Viral infections.  Pregnancy.  Your own defense system (immune system) attacking your thyroid.  Certain medicines.  Birth defects.  Past radiation treatments to your head or neck.  Past treatment with radioactive iodine.  Past surgical removal of part or all of your thyroid.  Problems with the gland that is located in the center of your brain (pituitary).  What are the signs or symptoms? Signs and symptoms of hypothyroidism may include:  Feeling as though you have no energy (lethargy).  Inability to tolerate cold.  Weight gain that is not explained by a change in diet or exercise habits.  Dry skin.  Coarse hair.  Menstrual irregularity.  Slowing of thought processes.  Constipation.  Sadness or depression.  How is this diagnosed? Your health care provider may diagnose hypothyroidism with blood tests and ultrasound tests. How is this treated? Hypothyroidism is treated with medicine that replaces the hormones that your body does not make. After you begin treatment, it may take several weeks for symptoms to go away. Follow these instructions at home:  Take medicines only as directed by your health care provider.  If you start taking any new medicines, tell your health care provider.  Keep all follow-up visits as directed by your health care provider. This is important. As your condition improves, your dosage needs may change. You will need to have blood tests regularly so that your health care  provider can watch your condition. Contact a health care provider if:  Your symptoms do not get better with treatment.  You are taking thyroid replacement medicine and: ? You sweat excessively. ? You have tremors. ? You feel anxious. ? You lose weight rapidly. ? You cannot tolerate heat. ? You have emotional swings. ? You have diarrhea. ? You feel weak. Get help right away if:  You develop chest pain.  You develop an irregular heartbeat.  You develop a rapid heartbeat. This information is not intended to replace advice given to you by your health care provider. Make sure you discuss any questions you have with your health care provider. Document Released: 02/10/2005 Document Revised: 07/19/2015 Document Reviewed: 06/28/2013 Elsevier Interactive Patient Education  2018 [employer].

## 2017-05-26 ENCOUNTER — Other Ambulatory Visit: Payer: Self-pay | Admitting: Family Medicine

## 2017-05-26 ENCOUNTER — Telehealth: Payer: Self-pay

## 2017-05-26 DIAGNOSIS — E039 Hypothyroidism, unspecified: Secondary | ICD-10-CM

## 2017-05-26 LAB — THYROID PANEL WITH TSH
Free Thyroxine Index: 1.1 — ABNORMAL LOW (ref 1.2–4.9)
T3 Uptake Ratio: 30 % (ref 24–39)
T4 TOTAL: 3.7 ug/dL — AB (ref 4.5–12.0)
TSH: 5.09 u[IU]/mL — AB (ref 0.450–4.500)

## 2017-05-26 NOTE — Telephone Encounter (Signed)
Called, no answer. Left a voicemail for patient to call back. Thanks!  

## 2017-05-26 NOTE — Telephone Encounter (Signed)
-----   Message from Dorena Dew, [place] sent at 05/26/2017  6:19 AM EDT ----- Regarding: lab results TSH is mildly elevate, however; patient was out of medication for several days. Please advise patient to take medication consistently and follow up in 6 weeks to repeat labs. Schedule lab appointment. Thanks   [name]  MSN, FNP-C Patient [facility] [address] [place], [place] [address] [phone]

## 2017-05-27 DIAGNOSIS — J3089 Other allergic rhinitis: Secondary | ICD-10-CM | POA: Diagnosis not present

## 2017-05-27 DIAGNOSIS — J301 Allergic rhinitis due to pollen: Secondary | ICD-10-CM | POA: Diagnosis not present

## 2017-05-27 DIAGNOSIS — J3081 Allergic rhinitis due to animal (cat) (dog) hair and dander: Secondary | ICD-10-CM | POA: Diagnosis not present

## 2017-06-01 DIAGNOSIS — J3089 Other allergic rhinitis: Secondary | ICD-10-CM | POA: Diagnosis not present

## 2017-06-01 DIAGNOSIS — J3081 Allergic rhinitis due to animal (cat) (dog) hair and dander: Secondary | ICD-10-CM | POA: Diagnosis not present

## 2017-06-01 DIAGNOSIS — J301 Allergic rhinitis due to pollen: Secondary | ICD-10-CM | POA: Diagnosis not present

## 2017-06-03 DIAGNOSIS — J301 Allergic rhinitis due to pollen: Secondary | ICD-10-CM | POA: Diagnosis not present

## 2017-06-03 DIAGNOSIS — J3089 Other allergic rhinitis: Secondary | ICD-10-CM | POA: Diagnosis not present

## 2017-06-03 DIAGNOSIS — J3081 Allergic rhinitis due to animal (cat) (dog) hair and dander: Secondary | ICD-10-CM | POA: Diagnosis not present

## 2017-06-07 ENCOUNTER — Other Ambulatory Visit: Payer: Self-pay | Admitting: Family Medicine

## 2017-06-07 DIAGNOSIS — E039 Hypothyroidism, unspecified: Secondary | ICD-10-CM

## 2017-06-08 DIAGNOSIS — J3089 Other allergic rhinitis: Secondary | ICD-10-CM | POA: Diagnosis not present

## 2017-06-08 DIAGNOSIS — J301 Allergic rhinitis due to pollen: Secondary | ICD-10-CM | POA: Diagnosis not present

## 2017-06-08 DIAGNOSIS — J3081 Allergic rhinitis due to animal (cat) (dog) hair and dander: Secondary | ICD-10-CM | POA: Diagnosis not present

## 2017-06-10 DIAGNOSIS — J301 Allergic rhinitis due to pollen: Secondary | ICD-10-CM | POA: Diagnosis not present

## 2017-06-10 DIAGNOSIS — J3081 Allergic rhinitis due to animal (cat) (dog) hair and dander: Secondary | ICD-10-CM | POA: Diagnosis not present

## 2017-06-10 DIAGNOSIS — J3089 Other allergic rhinitis: Secondary | ICD-10-CM | POA: Diagnosis not present

## 2017-06-15 DIAGNOSIS — J301 Allergic rhinitis due to pollen: Secondary | ICD-10-CM | POA: Diagnosis not present

## 2017-06-15 DIAGNOSIS — J3089 Other allergic rhinitis: Secondary | ICD-10-CM | POA: Diagnosis not present

## 2017-06-15 DIAGNOSIS — J3081 Allergic rhinitis due to animal (cat) (dog) hair and dander: Secondary | ICD-10-CM | POA: Diagnosis not present

## 2017-06-17 DIAGNOSIS — J3089 Other allergic rhinitis: Secondary | ICD-10-CM | POA: Diagnosis not present

## 2017-06-17 DIAGNOSIS — J3081 Allergic rhinitis due to animal (cat) (dog) hair and dander: Secondary | ICD-10-CM | POA: Diagnosis not present

## 2017-06-17 DIAGNOSIS — J301 Allergic rhinitis due to pollen: Secondary | ICD-10-CM | POA: Diagnosis not present

## 2017-06-22 DIAGNOSIS — J301 Allergic rhinitis due to pollen: Secondary | ICD-10-CM | POA: Diagnosis not present

## 2017-06-22 DIAGNOSIS — J3081 Allergic rhinitis due to animal (cat) (dog) hair and dander: Secondary | ICD-10-CM | POA: Diagnosis not present

## 2017-06-22 DIAGNOSIS — J3089 Other allergic rhinitis: Secondary | ICD-10-CM | POA: Diagnosis not present

## 2017-06-29 DIAGNOSIS — J301 Allergic rhinitis due to pollen: Secondary | ICD-10-CM | POA: Diagnosis not present

## 2017-06-29 DIAGNOSIS — J3089 Other allergic rhinitis: Secondary | ICD-10-CM | POA: Diagnosis not present

## 2017-06-29 DIAGNOSIS — J3081 Allergic rhinitis due to animal (cat) (dog) hair and dander: Secondary | ICD-10-CM | POA: Diagnosis not present

## 2017-07-03 ENCOUNTER — Other Ambulatory Visit: Payer: Self-pay | Admitting: Family Medicine

## 2017-07-07 DIAGNOSIS — J3089 Other allergic rhinitis: Secondary | ICD-10-CM | POA: Diagnosis not present

## 2017-07-07 DIAGNOSIS — J301 Allergic rhinitis due to pollen: Secondary | ICD-10-CM | POA: Diagnosis not present

## 2017-07-07 DIAGNOSIS — J3081 Allergic rhinitis due to animal (cat) (dog) hair and dander: Secondary | ICD-10-CM | POA: Diagnosis not present

## 2017-07-09 ENCOUNTER — Other Ambulatory Visit: Payer: Self-pay | Admitting: Family Medicine

## 2017-07-09 DIAGNOSIS — E039 Hypothyroidism, unspecified: Secondary | ICD-10-CM

## 2017-07-13 ENCOUNTER — Encounter: Payer: Self-pay | Admitting: Family Medicine

## 2017-07-13 ENCOUNTER — Telehealth: Payer: Self-pay

## 2017-07-13 DIAGNOSIS — J3089 Other allergic rhinitis: Secondary | ICD-10-CM | POA: Diagnosis not present

## 2017-07-13 DIAGNOSIS — J3081 Allergic rhinitis due to animal (cat) (dog) hair and dander: Secondary | ICD-10-CM | POA: Diagnosis not present

## 2017-07-13 DIAGNOSIS — J301 Allergic rhinitis due to pollen: Secondary | ICD-10-CM | POA: Diagnosis not present

## 2017-07-13 NOTE — Telephone Encounter (Signed)
CALLED AND LEFT A MESSAGE ADVISING PATIENT THAT THIS HAS BEEN SENT IN SINCE 8:08AM. THIS MORNING. THANKS!

## 2017-07-21 DIAGNOSIS — J301 Allergic rhinitis due to pollen: Secondary | ICD-10-CM | POA: Diagnosis not present

## 2017-07-21 DIAGNOSIS — J3089 Other allergic rhinitis: Secondary | ICD-10-CM | POA: Diagnosis not present

## 2017-07-21 DIAGNOSIS — J3081 Allergic rhinitis due to animal (cat) (dog) hair and dander: Secondary | ICD-10-CM | POA: Diagnosis not present

## 2017-07-27 DIAGNOSIS — J3089 Other allergic rhinitis: Secondary | ICD-10-CM | POA: Diagnosis not present

## 2017-07-27 DIAGNOSIS — J3081 Allergic rhinitis due to animal (cat) (dog) hair and dander: Secondary | ICD-10-CM | POA: Diagnosis not present

## 2017-07-27 DIAGNOSIS — J301 Allergic rhinitis due to pollen: Secondary | ICD-10-CM | POA: Diagnosis not present

## 2017-08-03 DIAGNOSIS — J301 Allergic rhinitis due to pollen: Secondary | ICD-10-CM | POA: Diagnosis not present

## 2017-08-03 DIAGNOSIS — J3081 Allergic rhinitis due to animal (cat) (dog) hair and dander: Secondary | ICD-10-CM | POA: Diagnosis not present

## 2017-08-03 DIAGNOSIS — J3089 Other allergic rhinitis: Secondary | ICD-10-CM | POA: Diagnosis not present

## 2017-08-12 DIAGNOSIS — J3089 Other allergic rhinitis: Secondary | ICD-10-CM | POA: Diagnosis not present

## 2017-08-12 DIAGNOSIS — J3081 Allergic rhinitis due to animal (cat) (dog) hair and dander: Secondary | ICD-10-CM | POA: Diagnosis not present

## 2017-08-12 DIAGNOSIS — J301 Allergic rhinitis due to pollen: Secondary | ICD-10-CM | POA: Diagnosis not present

## 2017-08-17 DIAGNOSIS — J301 Allergic rhinitis due to pollen: Secondary | ICD-10-CM | POA: Diagnosis not present

## 2017-08-17 DIAGNOSIS — J3081 Allergic rhinitis due to animal (cat) (dog) hair and dander: Secondary | ICD-10-CM | POA: Diagnosis not present

## 2017-08-17 DIAGNOSIS — J3089 Other allergic rhinitis: Secondary | ICD-10-CM | POA: Diagnosis not present

## 2017-08-26 DIAGNOSIS — J301 Allergic rhinitis due to pollen: Secondary | ICD-10-CM | POA: Diagnosis not present

## 2017-08-26 DIAGNOSIS — J3089 Other allergic rhinitis: Secondary | ICD-10-CM | POA: Diagnosis not present

## 2017-08-26 DIAGNOSIS — J3081 Allergic rhinitis due to animal (cat) (dog) hair and dander: Secondary | ICD-10-CM | POA: Diagnosis not present

## 2017-09-02 DIAGNOSIS — J301 Allergic rhinitis due to pollen: Secondary | ICD-10-CM | POA: Diagnosis not present

## 2017-09-02 DIAGNOSIS — J3081 Allergic rhinitis due to animal (cat) (dog) hair and dander: Secondary | ICD-10-CM | POA: Diagnosis not present

## 2017-09-02 DIAGNOSIS — J3089 Other allergic rhinitis: Secondary | ICD-10-CM | POA: Diagnosis not present

## 2017-09-06 ENCOUNTER — Other Ambulatory Visit: Payer: Self-pay | Admitting: Family Medicine

## 2017-09-06 DIAGNOSIS — J45909 Unspecified asthma, uncomplicated: Secondary | ICD-10-CM

## 2017-09-08 DIAGNOSIS — J301 Allergic rhinitis due to pollen: Secondary | ICD-10-CM | POA: Diagnosis not present

## 2017-09-08 DIAGNOSIS — J3089 Other allergic rhinitis: Secondary | ICD-10-CM | POA: Diagnosis not present

## 2017-09-08 DIAGNOSIS — J3081 Allergic rhinitis due to animal (cat) (dog) hair and dander: Secondary | ICD-10-CM | POA: Diagnosis not present

## 2017-09-14 DIAGNOSIS — J3089 Other allergic rhinitis: Secondary | ICD-10-CM | POA: Diagnosis not present

## 2017-09-14 DIAGNOSIS — J301 Allergic rhinitis due to pollen: Secondary | ICD-10-CM | POA: Diagnosis not present

## 2017-09-14 DIAGNOSIS — J3081 Allergic rhinitis due to animal (cat) (dog) hair and dander: Secondary | ICD-10-CM | POA: Diagnosis not present

## 2017-09-22 DIAGNOSIS — J3089 Other allergic rhinitis: Secondary | ICD-10-CM | POA: Diagnosis not present

## 2017-09-22 DIAGNOSIS — J301 Allergic rhinitis due to pollen: Secondary | ICD-10-CM | POA: Diagnosis not present

## 2017-09-22 DIAGNOSIS — J3081 Allergic rhinitis due to animal (cat) (dog) hair and dander: Secondary | ICD-10-CM | POA: Diagnosis not present

## 2017-09-23 DIAGNOSIS — J301 Allergic rhinitis due to pollen: Secondary | ICD-10-CM | POA: Diagnosis not present

## 2017-09-24 DIAGNOSIS — J301 Allergic rhinitis due to pollen: Secondary | ICD-10-CM | POA: Diagnosis not present

## 2017-09-24 DIAGNOSIS — J3081 Allergic rhinitis due to animal (cat) (dog) hair and dander: Secondary | ICD-10-CM | POA: Diagnosis not present

## 2017-09-24 DIAGNOSIS — J3089 Other allergic rhinitis: Secondary | ICD-10-CM | POA: Diagnosis not present

## 2017-09-28 DIAGNOSIS — J301 Allergic rhinitis due to pollen: Secondary | ICD-10-CM | POA: Diagnosis not present

## 2017-09-28 DIAGNOSIS — J3081 Allergic rhinitis due to animal (cat) (dog) hair and dander: Secondary | ICD-10-CM | POA: Diagnosis not present

## 2017-09-28 DIAGNOSIS — J3089 Other allergic rhinitis: Secondary | ICD-10-CM | POA: Diagnosis not present

## 2017-09-29 ENCOUNTER — Other Ambulatory Visit: Payer: Self-pay | Admitting: Family Medicine

## 2017-10-04 ENCOUNTER — Other Ambulatory Visit: Payer: Self-pay | Admitting: Family Medicine

## 2017-10-04 DIAGNOSIS — E039 Hypothyroidism, unspecified: Secondary | ICD-10-CM

## 2017-10-05 DIAGNOSIS — J3081 Allergic rhinitis due to animal (cat) (dog) hair and dander: Secondary | ICD-10-CM | POA: Diagnosis not present

## 2017-10-05 DIAGNOSIS — J3089 Other allergic rhinitis: Secondary | ICD-10-CM | POA: Diagnosis not present

## 2017-10-05 DIAGNOSIS — J301 Allergic rhinitis due to pollen: Secondary | ICD-10-CM | POA: Diagnosis not present

## 2017-10-08 ENCOUNTER — Other Ambulatory Visit: Payer: Self-pay

## 2017-10-08 DIAGNOSIS — J45909 Unspecified asthma, uncomplicated: Secondary | ICD-10-CM

## 2017-10-08 MED ORDER — BUDESONIDE-FORMOTEROL FUMARATE 80-4.5 MCG/ACT IN AERO
2.0000 | INHALATION_SPRAY | Freq: Two times a day (BID) | RESPIRATORY_TRACT | 0 refills | Status: DC
Start: 2017-10-08 — End: 2017-11-05

## 2017-10-12 DIAGNOSIS — J3089 Other allergic rhinitis: Secondary | ICD-10-CM | POA: Diagnosis not present

## 2017-10-12 DIAGNOSIS — J301 Allergic rhinitis due to pollen: Secondary | ICD-10-CM | POA: Diagnosis not present

## 2017-10-12 DIAGNOSIS — J3081 Allergic rhinitis due to animal (cat) (dog) hair and dander: Secondary | ICD-10-CM | POA: Diagnosis not present

## 2017-10-19 DIAGNOSIS — J301 Allergic rhinitis due to pollen: Secondary | ICD-10-CM | POA: Diagnosis not present

## 2017-10-19 DIAGNOSIS — J3089 Other allergic rhinitis: Secondary | ICD-10-CM | POA: Diagnosis not present

## 2017-10-19 DIAGNOSIS — J3081 Allergic rhinitis due to animal (cat) (dog) hair and dander: Secondary | ICD-10-CM | POA: Diagnosis not present

## 2017-10-21 DIAGNOSIS — J301 Allergic rhinitis due to pollen: Secondary | ICD-10-CM | POA: Diagnosis not present

## 2017-10-21 DIAGNOSIS — J3081 Allergic rhinitis due to animal (cat) (dog) hair and dander: Secondary | ICD-10-CM | POA: Diagnosis not present

## 2017-10-21 DIAGNOSIS — J3089 Other allergic rhinitis: Secondary | ICD-10-CM | POA: Diagnosis not present

## 2017-10-27 DIAGNOSIS — J3081 Allergic rhinitis due to animal (cat) (dog) hair and dander: Secondary | ICD-10-CM | POA: Diagnosis not present

## 2017-10-27 DIAGNOSIS — J3089 Other allergic rhinitis: Secondary | ICD-10-CM | POA: Diagnosis not present

## 2017-10-27 DIAGNOSIS — J301 Allergic rhinitis due to pollen: Secondary | ICD-10-CM | POA: Diagnosis not present

## 2017-10-29 DIAGNOSIS — J3081 Allergic rhinitis due to animal (cat) (dog) hair and dander: Secondary | ICD-10-CM | POA: Diagnosis not present

## 2017-10-29 DIAGNOSIS — J301 Allergic rhinitis due to pollen: Secondary | ICD-10-CM | POA: Diagnosis not present

## 2017-10-29 DIAGNOSIS — J3089 Other allergic rhinitis: Secondary | ICD-10-CM | POA: Diagnosis not present

## 2017-11-02 DIAGNOSIS — J3081 Allergic rhinitis due to animal (cat) (dog) hair and dander: Secondary | ICD-10-CM | POA: Diagnosis not present

## 2017-11-02 DIAGNOSIS — J301 Allergic rhinitis due to pollen: Secondary | ICD-10-CM | POA: Diagnosis not present

## 2017-11-02 DIAGNOSIS — J3089 Other allergic rhinitis: Secondary | ICD-10-CM | POA: Diagnosis not present

## 2017-11-05 ENCOUNTER — Other Ambulatory Visit: Payer: Self-pay | Admitting: Family Medicine

## 2017-11-05 DIAGNOSIS — J45909 Unspecified asthma, uncomplicated: Secondary | ICD-10-CM

## 2017-11-08 IMAGING — CT CT ABD-PELV W/O CM
3 of 4 series · 13 of 36 positions shown, 19 images · IV contrast (READICAT/WATER &)
Comparison: None.

CLINICAL DATA: Possible left inguinal hernia. Left lower quadrant
pain.

EXAM:
CT ABDOMEN AND PELVIS WITHOUT CONTRAST
TECHNIQUE: Multidetector CT imaging of the abdomen and pelvis was performed
following the standard protocol without IV contrast.

[Series 3: abd/pelvis w/o · axial · non-contrast · 0.72mm/px · z∈[-400,-50]mm · 8 of 90 slices shown, 13 images]
[im 10/90  soft-tissue]
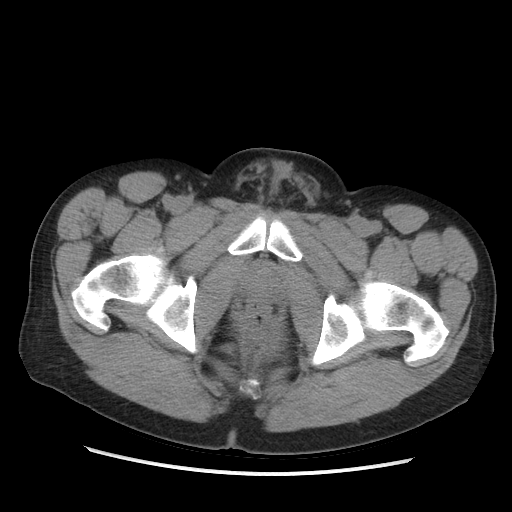
[im 10/90  bone]
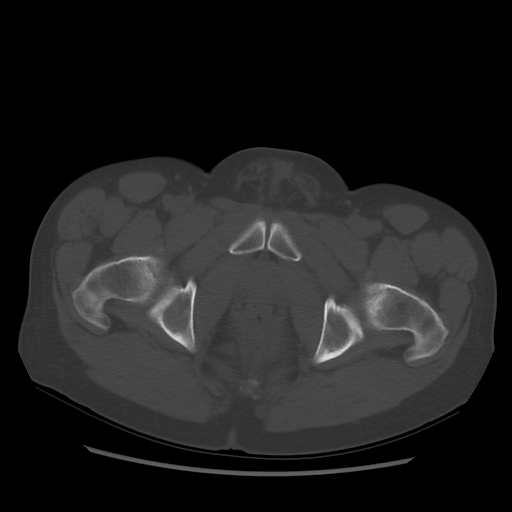
[im 20/90  soft-tissue]
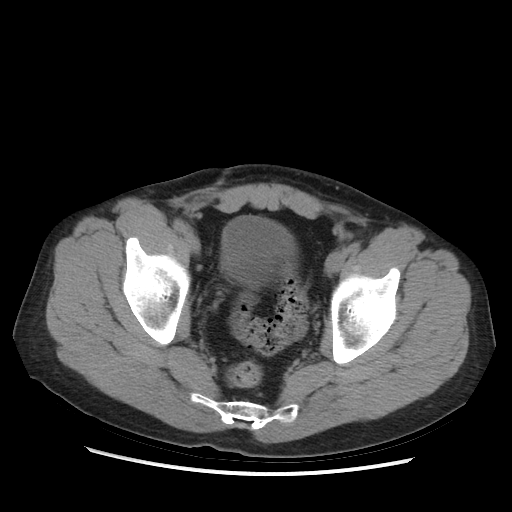
[im 30/90  soft-tissue]
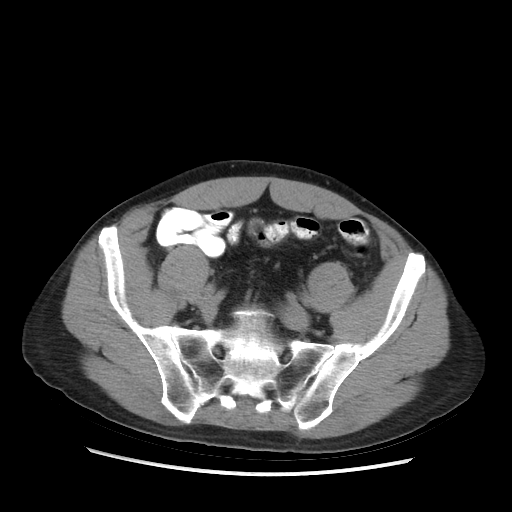
[im 40/90  soft-tissue]
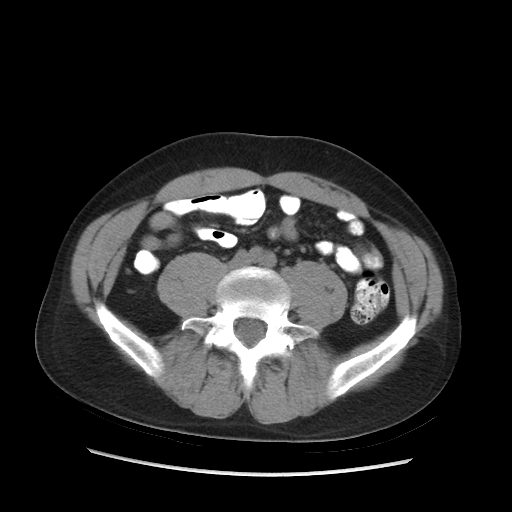
[im 50/90  soft-tissue]
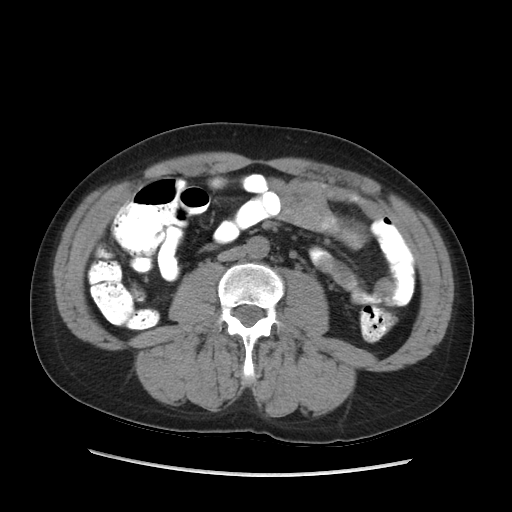
[im 50/90  lung]
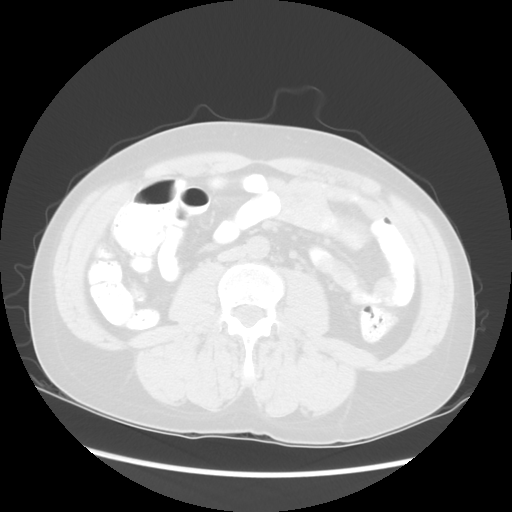
[im 60/90  soft-tissue]
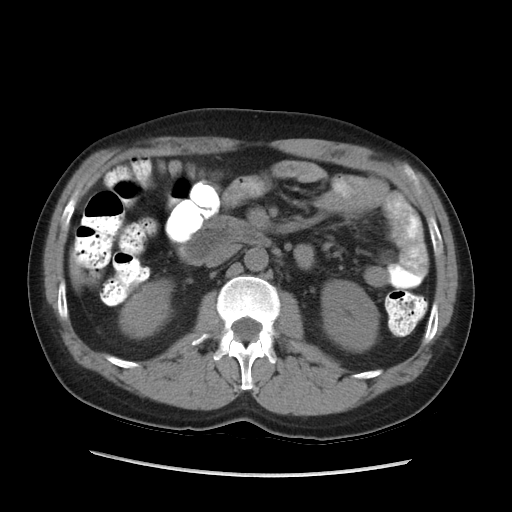
[im 60/90  lung]
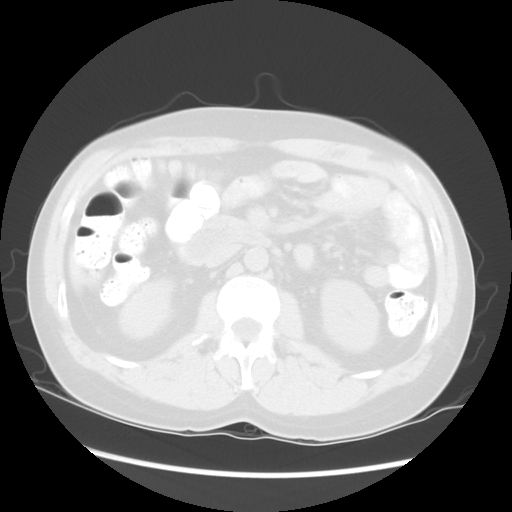
[im 70/90  soft-tissue]
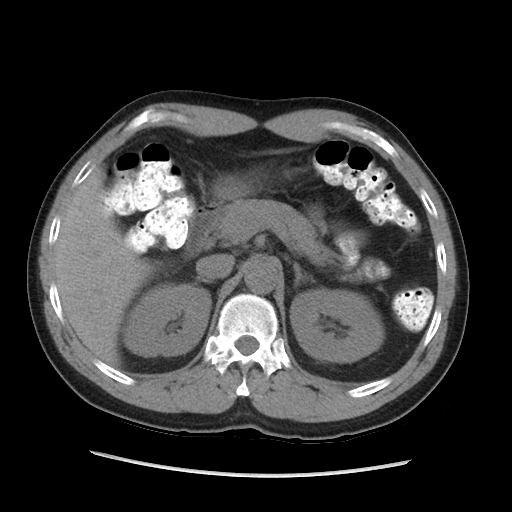
[im 70/90  lung]
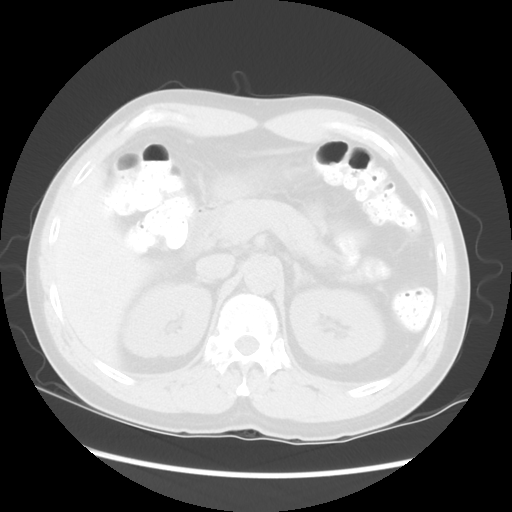
[im 80/90  soft-tissue]
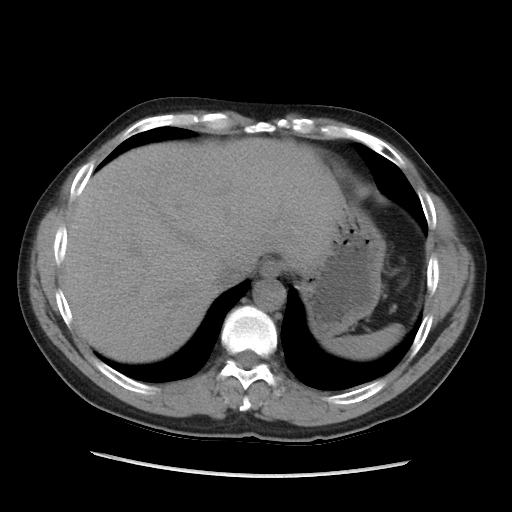
[im 80/90  lung]
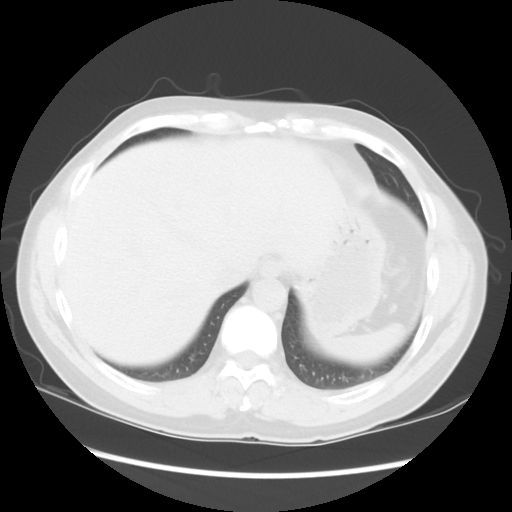

[Series 601: coronal body · coronal · 0.93mm/px · 1 of 110 slices shown, 2 images]
[im 37/110  soft-tissue]
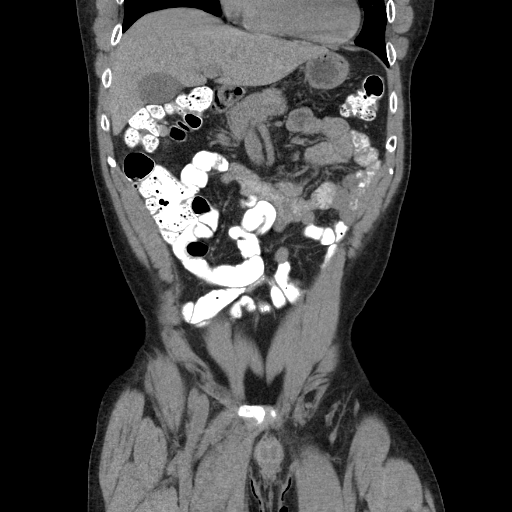
[im 37/110  bone]
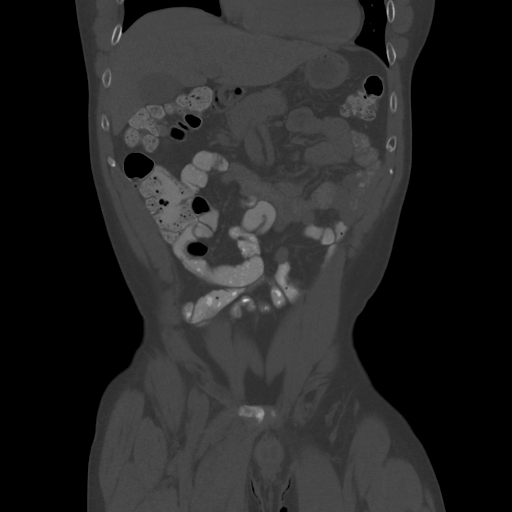

[Series 602: sagittal body · sagittal · 0.93mm/px · 4 of 144 slices shown]
[im 17/144  soft-tissue]
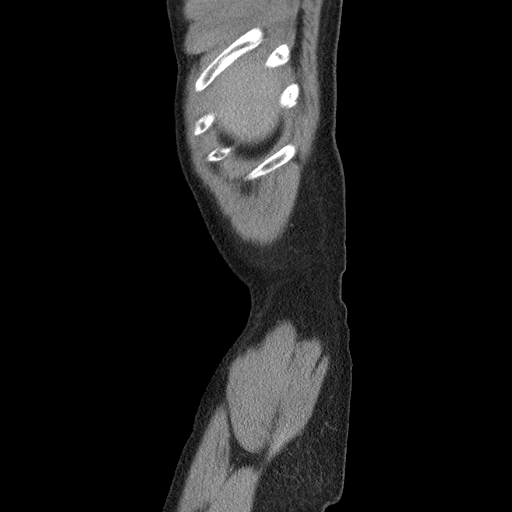
[im 34/144  soft-tissue]
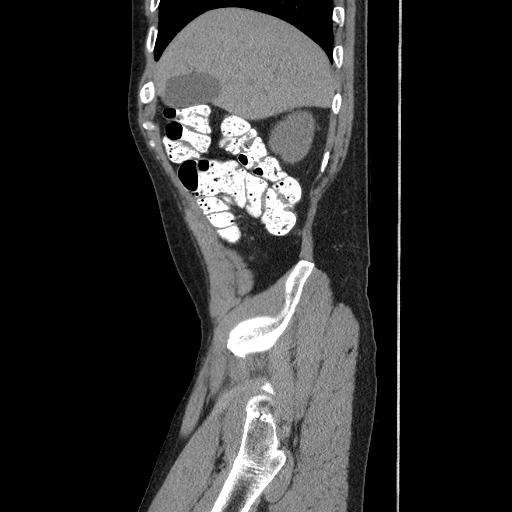
[im 51/144  soft-tissue]
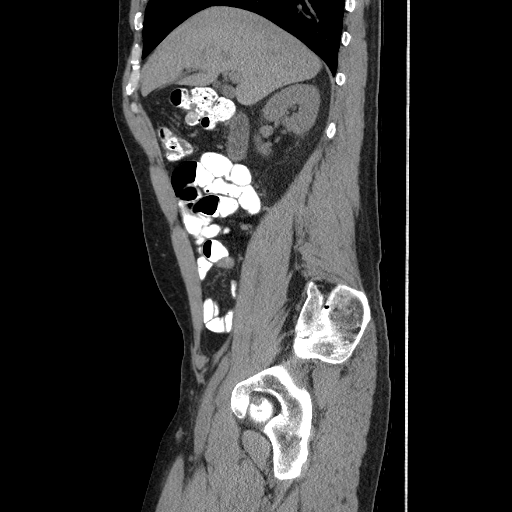
[im 68/144  soft-tissue]
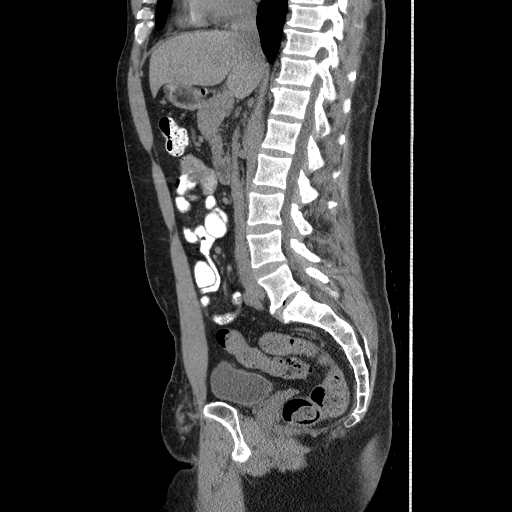

[13 of 36 positions shown; findings below may reference images not displayed]

FINDINGS: Lower chest: Lung bases are clear. No effusions. Heart is normal
size.

Hepatobiliary: No focal hepatic abnormality. Gallbladder
unremarkable.

Pancreas: No focal abnormality or ductal dilatation.

Spleen: No focal abnormality.  Normal size.

Adrenals/Urinary Tract: No adrenal abnormality. No focal renal
abnormality. No stones or hydronephrosis. Urinary bladder is
unremarkable.

Stomach/Bowel: Moderate stool in the colon. Appendix is normal.
Stomach and small bowel decompressed.

Vascular/Lymphatic: No evidence of aneurysm or adenopathy.

Reproductive: No visible focal abnormality.

Other: No free fluid or free air. No inguinal or ventral wall
hernia.

Musculoskeletal: No acute bony abnormality or focal bone lesion.
IMPRESSION: No acute findings in the abdomen or pelvis.  No evidence of hernia.

## 2017-11-10 DIAGNOSIS — J3089 Other allergic rhinitis: Secondary | ICD-10-CM | POA: Diagnosis not present

## 2017-11-10 DIAGNOSIS — J301 Allergic rhinitis due to pollen: Secondary | ICD-10-CM | POA: Diagnosis not present

## 2017-11-10 DIAGNOSIS — J3081 Allergic rhinitis due to animal (cat) (dog) hair and dander: Secondary | ICD-10-CM | POA: Diagnosis not present

## 2017-11-16 DIAGNOSIS — J301 Allergic rhinitis due to pollen: Secondary | ICD-10-CM | POA: Diagnosis not present

## 2017-11-16 DIAGNOSIS — J3081 Allergic rhinitis due to animal (cat) (dog) hair and dander: Secondary | ICD-10-CM | POA: Diagnosis not present

## 2017-11-16 DIAGNOSIS — J3089 Other allergic rhinitis: Secondary | ICD-10-CM | POA: Diagnosis not present

## 2017-11-23 ENCOUNTER — Ambulatory Visit (INDEPENDENT_AMBULATORY_CARE_PROVIDER_SITE_OTHER): Payer: Federal, State, Local not specified - PPO | Admitting: Family Medicine

## 2017-11-23 ENCOUNTER — Encounter: Payer: Self-pay | Admitting: Family Medicine

## 2017-11-23 VITALS — BP 123/80 | HR 79 | Temp 98.4°F | Resp 14 | Ht 69.0 in | Wt 166.0 lb

## 2017-11-23 DIAGNOSIS — E039 Hypothyroidism, unspecified: Secondary | ICD-10-CM

## 2017-11-23 DIAGNOSIS — J301 Allergic rhinitis due to pollen: Secondary | ICD-10-CM

## 2017-11-23 DIAGNOSIS — B86 Scabies: Secondary | ICD-10-CM | POA: Diagnosis not present

## 2017-11-23 DIAGNOSIS — L209 Atopic dermatitis, unspecified: Secondary | ICD-10-CM | POA: Diagnosis not present

## 2017-11-23 MED ORDER — PERMETHRIN 5 % EX CREA: 1.0000 "application " | TOPICAL_CREAM | Freq: Once | CUTANEOUS | 0 refills | Status: AC

## 2017-11-23 MED ORDER — MONTELUKAST SODIUM 10 MG PO TABS: 10.0000 mg | ORAL_TABLET | Freq: Every day | ORAL | 3 refills | Status: AC

## 2017-11-23 MED ORDER — HYDROXYZINE PAMOATE 25 MG PO CAPS: 25.0000 mg | ORAL_CAPSULE | Freq: Three times a day (TID) | ORAL | 0 refills | Status: DC | PRN

## 2017-11-23 MED ORDER — LEVOTHYROXINE SODIUM 25 MCG PO TABS: ORAL_TABLET | ORAL | 0 refills | Status: DC

## 2017-11-23 MED ORDER — TRIAMCINOLONE ACETONIDE 0.5 % EX OINT
1.0000 | TOPICAL_OINTMENT | Freq: Two times a day (BID) | CUTANEOUS | 0 refills | Status: DC
Start: 2017-11-23 — End: 2019-01-10

## 2017-11-23 NOTE — Patient Instructions (Signed)
Scabies, Adult Scabies is a skin condition that happens when very small insects get under the skin (infestation). This causes a rash and severe itchiness. Scabies can spread from person to person (is contagious). If you get scabies, it is common for others in your household to get scabies too. With proper treatment, symptoms usually go away in 2-4 weeks. Scabies usually does not cause lasting problems. What are the causes? This condition is caused by mites (Sarcoptes scabiei, or human itch mites) that can only be seen with a microscope. The mites get into the top layer of skin and lay eggs. Scabies can spread from person to person through:  Close contact with a person who has scabies.  Contact with infested items, such as towels, bedding, or clothing.  What increases the risk? This condition is more likely to develop in:  People who live in nursing homes and other extended-care facilities.  People who have sexual contact with a partner who has scabies.  Young children who attend child care facilities.  People who care for others who are at increased risk for scabies.  What are the signs or symptoms? Symptoms of this condition may include:  Severe itchiness. This is often worse at night.  A rash that includes tiny red bumps or blisters. The rash commonly occurs on the wrist, elbow, armpit, fingers, waist, groin, or buttocks. Bumps may form a line (burrow) in some areas.  Skin irritation. This can include scaly patches or sores.  How is this diagnosed? This condition is diagnosed with a physical exam. Your health care provider will look closely at your skin. In some cases, your health care provider may take a sample of your affected skin (skin scraping) and have it examined under a microscope. How is this treated? This condition may be treated with:  Medicated cream or lotion that kills the mites. This is spread on the entire body and left on for several hours. Usually, one treatment  with medicated cream or lotion is enough to kill all of the mites. In severe cases, the treatment may be repeated.  Medicated cream that relieves itching.  Medicines that help to relieve itching.  Medicines that kill the mites. This treatment is rarely used.  Follow these instructions at home:  Medicines  Take or apply over-the-counter and prescription medicines as told by your health care provider.  Apply medicated cream or lotion as told by your health care provider.  Do not wash off the medicated cream or lotion until the necessary amount of time has passed. Skin Care  Avoid scratching your affected skin.  Keep your fingernails closely trimmed to reduce injury from scratching.  Take cool baths or apply cool washcloths to help reduce itching. General instructions  Clean all items that you recently had contact with, including bedding, clothing, and furniture. Do this on the same day that your treatment starts. ? Use hot water when you wash items. ? Place unwashable items into closed, airtight plastic bags for at least 3 days. The mites cannot live for more than 3 days away from human skin. ? Vacuum furniture and mattresses that you use.  Make sure that other people who may have been infested are examined by a health care provider. These include members of your household and anyone who may have had contact with infested items.  Keep all follow-up visits as told by your health care provider. This is important. Contact a health care provider if:  You have itching that does not go away   after 4 weeks of treatment.  You continue to develop new bumps or burrows.  You have redness, swelling, or pain in your rash area after treatment.  You have fluid, blood, or pus coming from your rash. This information is not intended to replace advice given to you by your health care provider. Make sure you discuss any questions you have with your health care provider. Document Released:  11/01/2014 Document Revised: 07/19/2015 Document Reviewed: 09/12/2014 Elsevier Interactive Patient Education  2018 Else[employer] Inc.  

## 2017-11-23 NOTE — Progress Notes (Signed)
Patient [facility]   Progress Note: General Provider: Lanae Boast, FNP  SUBJECTIVE:   Don Hurst is a 57 y.o. male who  has a past medical history of Allergy, Arthritis, Asthma, GERD (gastroesophageal reflux disease), History of chronic bronchitis, Multiple allergies, and Wears glasses.. Patient presents today for Hypothyroidism and Rash (on arms ) Patient presents with a rash to the upper arms and shoulders neck and back.  He states that the rash is most itchy at night.  Patient states that he noticed the rash after picking up a box at work that was filled with linens and had a foul smell.  Patient denies taking any medications for the rash.  No one else in the family has similar symptoms.  Denies use of new products. Patient has an appointment scheduled with allergist for allergy testing and is not taking antihistamines at the present time.  He denies sore throat cough difficulty breathing or shortness of breath. Review of Systems  Constitutional: Negative.   HENT: Negative.   Eyes: Negative.   Respiratory: Negative.   Cardiovascular: Negative.   Gastrointestinal: Negative.   Genitourinary: Negative.   Musculoskeletal: Negative.   Skin: Positive for itching and rash.  Neurological: Negative.   Psychiatric/Behavioral: Negative.      OBJECTIVE: BP 135/85 (BP Location: Left Arm, Patient Position: Sitting, Cuff Size: Normal)   Pulse 79   Temp 98.4 F (36.9 C) (Oral)   Resp 14   Ht 5\' 9"  (1.753 m)   Wt 166 lb (75.3 kg)   SpO2 100%   BMI 24.51 kg/m   Physical Exam  Constitutional: He is oriented to person, place, and time. He appears well-developed and well-nourished. No distress.  HENT:  Head: Normocephalic and atraumatic.  Eyes: Pupils are equal, round, and reactive to light. Conjunctivae and EOM are normal.  Neck: Normal range of motion.  Cardiovascular: Normal rate, regular rhythm, normal heart sounds and intact distal pulses.    Pulmonary/Chest: Effort normal and breath sounds normal. No respiratory distress.  Abdominal: Soft. Bowel sounds are normal. He exhibits no distension.  Musculoskeletal: Normal range of motion.  Neurological: He is alert and oriented to person, place, and time.  Skin: Skin is warm and dry. Rash noted.     Psychiatric: He has a normal mood and affect. His behavior is normal. Thought content normal.  Nursing note and vitals reviewed.   ASSESSMENT/PLAN:  1. Acquired hypothyroidism - levothyroxine (SYNTHROID, LEVOTHROID) 25 MCG tablet; TAKE 1 TABLET(25 MCG) BY MOUTH DAILY BEFORE BREAKFAST  Dispense: 60 tablet; Refill: 0 - Comprehensive metabolic panel - Thyroid Panel With TSH  2. Chronic allergic rhinitis due to pollen - montelukast (SINGULAIR) 10 MG tablet; Take 1 tablet (10 mg total) by mouth at bedtime.  Dispense: 90 tablet; Refill: 3  3. Scabies Patient given information on washing linens and cleaning his home. - permethrin (ELIMITE) 5 % cream; Apply 1 application topically once for 1 dose. Apply from neck to soles of feet. Leave on for 8 hours and shower.  Dispense: 60 g; Refill: 0 - hydrOXYzine (VISTARIL) 25 MG capsule; Take 1 capsule (25 mg total) by mouth every 8 (eight) hours as needed for itching.  Dispense: 30 capsule; Refill: 0  4. Atopic dermatitis, unspecified type - triamcinolone ointment (KENALOG) 0.5 %; Apply 1 application topically 2 (two) times daily.  Dispense: 60 g; Refill: 0        The patient was given clear instructions to go to ER or  return to medical center if symptoms do not improve, worsen or new problems develop. The patient verbalized understanding and agreed with plan of care.   Ms. [name]. Nathaneil Canary, FNP-BC Patient [facility] [address] [place], [place] [address] [phone]     This note has been created with Dragon speech recognition software and smart phrase technology. Any transcriptional errors are  unintentional.

## 2017-11-24 ENCOUNTER — Ambulatory Visit: Payer: Federal, State, Local not specified - PPO | Admitting: Family Medicine

## 2017-11-24 LAB — COMPREHENSIVE METABOLIC PANEL
ALT: 26 IU/L (ref 0–44)
AST: 24 IU/L (ref 0–40)
Albumin/Globulin Ratio: 1.4 (ref 1.2–2.2)
Albumin: 4.3 g/dL (ref 3.5–5.5)
Alkaline Phosphatase: 91 IU/L (ref 39–117)
BUN/Creatinine Ratio: 12 (ref 9–20)
BUN: 12 mg/dL (ref 6–24)
Bilirubin Total: 0.3 mg/dL (ref 0.0–1.2)
CO2: 22 mmol/L (ref 20–29)
Calcium: 9.2 mg/dL (ref 8.7–10.2)
Chloride: 103 mmol/L (ref 96–106)
Creatinine, Ser: 1.01 mg/dL (ref 0.76–1.27)
GFR calc Af Amer: 97 mL/min/{1.73_m2} (ref >59)
GFR calc non Af Amer: 84 mL/min/{1.73_m2} (ref >59)
Globulin, Total: 3.1 g/dL (ref 1.5–4.5)
Glucose: 96 mg/dL (ref 65–99)
Potassium: 4.3 mmol/L (ref 3.5–5.2)
Sodium: 138 mmol/L (ref 134–144)
Total Protein: 7.4 g/dL (ref 6.0–8.5)

## 2017-11-24 LAB — THYROID PANEL WITH TSH
Free Thyroxine Index: 1.3 (ref 1.2–4.9)
T3 Uptake Ratio: 31 % (ref 24–39)
T4, Total: 4.1 ug/dL — ABNORMAL LOW (ref 4.5–12.0)
TSH: 4.94 u[IU]/mL — ABNORMAL HIGH (ref 0.450–4.500)

## 2017-11-27 DIAGNOSIS — J301 Allergic rhinitis due to pollen: Secondary | ICD-10-CM | POA: Diagnosis not present

## 2017-11-27 DIAGNOSIS — H1045 Other chronic allergic conjunctivitis: Secondary | ICD-10-CM | POA: Diagnosis not present

## 2017-11-27 DIAGNOSIS — J4520 Mild intermittent asthma, uncomplicated: Secondary | ICD-10-CM | POA: Diagnosis not present

## 2017-11-27 DIAGNOSIS — J3089 Other allergic rhinitis: Secondary | ICD-10-CM | POA: Diagnosis not present

## 2017-11-30 DIAGNOSIS — J301 Allergic rhinitis due to pollen: Secondary | ICD-10-CM | POA: Diagnosis not present

## 2017-11-30 DIAGNOSIS — J3081 Allergic rhinitis due to animal (cat) (dog) hair and dander: Secondary | ICD-10-CM | POA: Diagnosis not present

## 2017-11-30 DIAGNOSIS — J3089 Other allergic rhinitis: Secondary | ICD-10-CM | POA: Diagnosis not present

## 2017-12-16 DIAGNOSIS — J3089 Other allergic rhinitis: Secondary | ICD-10-CM | POA: Diagnosis not present

## 2017-12-16 DIAGNOSIS — J301 Allergic rhinitis due to pollen: Secondary | ICD-10-CM | POA: Diagnosis not present

## 2017-12-16 DIAGNOSIS — J3081 Allergic rhinitis due to animal (cat) (dog) hair and dander: Secondary | ICD-10-CM | POA: Diagnosis not present

## 2017-12-22 DIAGNOSIS — J3081 Allergic rhinitis due to animal (cat) (dog) hair and dander: Secondary | ICD-10-CM | POA: Diagnosis not present

## 2017-12-22 DIAGNOSIS — J301 Allergic rhinitis due to pollen: Secondary | ICD-10-CM | POA: Diagnosis not present

## 2017-12-22 DIAGNOSIS — J3089 Other allergic rhinitis: Secondary | ICD-10-CM | POA: Diagnosis not present

## 2017-12-29 DIAGNOSIS — J3089 Other allergic rhinitis: Secondary | ICD-10-CM | POA: Diagnosis not present

## 2017-12-29 DIAGNOSIS — J301 Allergic rhinitis due to pollen: Secondary | ICD-10-CM | POA: Diagnosis not present

## 2017-12-29 DIAGNOSIS — J3081 Allergic rhinitis due to animal (cat) (dog) hair and dander: Secondary | ICD-10-CM | POA: Diagnosis not present

## 2018-01-04 ENCOUNTER — Other Ambulatory Visit: Payer: Self-pay | Admitting: Family Medicine

## 2018-01-04 DIAGNOSIS — J45909 Unspecified asthma, uncomplicated: Secondary | ICD-10-CM

## 2018-01-07 DIAGNOSIS — J301 Allergic rhinitis due to pollen: Secondary | ICD-10-CM | POA: Diagnosis not present

## 2018-01-07 DIAGNOSIS — J3081 Allergic rhinitis due to animal (cat) (dog) hair and dander: Secondary | ICD-10-CM | POA: Diagnosis not present

## 2018-01-07 DIAGNOSIS — J3089 Other allergic rhinitis: Secondary | ICD-10-CM | POA: Diagnosis not present

## 2018-01-11 DIAGNOSIS — J301 Allergic rhinitis due to pollen: Secondary | ICD-10-CM | POA: Diagnosis not present

## 2018-01-11 DIAGNOSIS — J3089 Other allergic rhinitis: Secondary | ICD-10-CM | POA: Diagnosis not present

## 2018-01-11 DIAGNOSIS — J3081 Allergic rhinitis due to animal (cat) (dog) hair and dander: Secondary | ICD-10-CM | POA: Diagnosis not present

## 2018-01-18 DIAGNOSIS — J301 Allergic rhinitis due to pollen: Secondary | ICD-10-CM | POA: Diagnosis not present

## 2018-01-18 DIAGNOSIS — J3081 Allergic rhinitis due to animal (cat) (dog) hair and dander: Secondary | ICD-10-CM | POA: Diagnosis not present

## 2018-01-18 DIAGNOSIS — J3089 Other allergic rhinitis: Secondary | ICD-10-CM | POA: Diagnosis not present

## 2018-01-29 DIAGNOSIS — J3089 Other allergic rhinitis: Secondary | ICD-10-CM | POA: Diagnosis not present

## 2018-01-29 DIAGNOSIS — J3081 Allergic rhinitis due to animal (cat) (dog) hair and dander: Secondary | ICD-10-CM | POA: Diagnosis not present

## 2018-01-29 DIAGNOSIS — J301 Allergic rhinitis due to pollen: Secondary | ICD-10-CM | POA: Diagnosis not present

## 2018-02-01 DIAGNOSIS — J3081 Allergic rhinitis due to animal (cat) (dog) hair and dander: Secondary | ICD-10-CM | POA: Diagnosis not present

## 2018-02-01 DIAGNOSIS — J301 Allergic rhinitis due to pollen: Secondary | ICD-10-CM | POA: Diagnosis not present

## 2018-02-01 DIAGNOSIS — J3089 Other allergic rhinitis: Secondary | ICD-10-CM | POA: Diagnosis not present

## 2018-02-07 ENCOUNTER — Other Ambulatory Visit: Payer: Self-pay | Admitting: Family Medicine

## 2018-02-07 DIAGNOSIS — E039 Hypothyroidism, unspecified: Secondary | ICD-10-CM

## 2018-02-09 DIAGNOSIS — J3089 Other allergic rhinitis: Secondary | ICD-10-CM | POA: Diagnosis not present

## 2018-02-09 DIAGNOSIS — J3081 Allergic rhinitis due to animal (cat) (dog) hair and dander: Secondary | ICD-10-CM | POA: Diagnosis not present

## 2018-02-09 DIAGNOSIS — J301 Allergic rhinitis due to pollen: Secondary | ICD-10-CM | POA: Diagnosis not present

## 2018-02-15 DIAGNOSIS — J3089 Other allergic rhinitis: Secondary | ICD-10-CM | POA: Diagnosis not present

## 2018-02-15 DIAGNOSIS — J301 Allergic rhinitis due to pollen: Secondary | ICD-10-CM | POA: Diagnosis not present

## 2018-02-15 DIAGNOSIS — J3081 Allergic rhinitis due to animal (cat) (dog) hair and dander: Secondary | ICD-10-CM | POA: Diagnosis not present

## 2018-02-22 DIAGNOSIS — J301 Allergic rhinitis due to pollen: Secondary | ICD-10-CM | POA: Diagnosis not present

## 2018-02-22 DIAGNOSIS — J3089 Other allergic rhinitis: Secondary | ICD-10-CM | POA: Diagnosis not present

## 2018-02-22 DIAGNOSIS — J3081 Allergic rhinitis due to animal (cat) (dog) hair and dander: Secondary | ICD-10-CM | POA: Diagnosis not present

## 2018-03-01 DIAGNOSIS — J3089 Other allergic rhinitis: Secondary | ICD-10-CM | POA: Diagnosis not present

## 2018-03-01 DIAGNOSIS — J3081 Allergic rhinitis due to animal (cat) (dog) hair and dander: Secondary | ICD-10-CM | POA: Diagnosis not present

## 2018-03-01 DIAGNOSIS — J301 Allergic rhinitis due to pollen: Secondary | ICD-10-CM | POA: Diagnosis not present

## 2018-03-08 DIAGNOSIS — J3089 Other allergic rhinitis: Secondary | ICD-10-CM | POA: Diagnosis not present

## 2018-03-08 DIAGNOSIS — J3081 Allergic rhinitis due to animal (cat) (dog) hair and dander: Secondary | ICD-10-CM | POA: Diagnosis not present

## 2018-03-08 DIAGNOSIS — J301 Allergic rhinitis due to pollen: Secondary | ICD-10-CM | POA: Diagnosis not present

## 2018-03-15 DIAGNOSIS — J3089 Other allergic rhinitis: Secondary | ICD-10-CM | POA: Diagnosis not present

## 2018-03-15 DIAGNOSIS — J301 Allergic rhinitis due to pollen: Secondary | ICD-10-CM | POA: Diagnosis not present

## 2018-03-15 DIAGNOSIS — J3081 Allergic rhinitis due to animal (cat) (dog) hair and dander: Secondary | ICD-10-CM | POA: Diagnosis not present

## 2018-03-16 DIAGNOSIS — J301 Allergic rhinitis due to pollen: Secondary | ICD-10-CM | POA: Diagnosis not present

## 2018-03-16 DIAGNOSIS — J3089 Other allergic rhinitis: Secondary | ICD-10-CM | POA: Diagnosis not present

## 2018-03-16 DIAGNOSIS — J3081 Allergic rhinitis due to animal (cat) (dog) hair and dander: Secondary | ICD-10-CM | POA: Diagnosis not present

## 2018-03-24 DIAGNOSIS — J301 Allergic rhinitis due to pollen: Secondary | ICD-10-CM | POA: Diagnosis not present

## 2018-03-24 DIAGNOSIS — J3081 Allergic rhinitis due to animal (cat) (dog) hair and dander: Secondary | ICD-10-CM | POA: Diagnosis not present

## 2018-03-24 DIAGNOSIS — J3089 Other allergic rhinitis: Secondary | ICD-10-CM | POA: Diagnosis not present

## 2018-03-29 DIAGNOSIS — J3089 Other allergic rhinitis: Secondary | ICD-10-CM | POA: Diagnosis not present

## 2018-03-29 DIAGNOSIS — J3081 Allergic rhinitis due to animal (cat) (dog) hair and dander: Secondary | ICD-10-CM | POA: Diagnosis not present

## 2018-03-29 DIAGNOSIS — J301 Allergic rhinitis due to pollen: Secondary | ICD-10-CM | POA: Diagnosis not present

## 2018-04-04 ENCOUNTER — Other Ambulatory Visit: Payer: Self-pay | Admitting: Family Medicine

## 2018-04-04 DIAGNOSIS — E039 Hypothyroidism, unspecified: Secondary | ICD-10-CM

## 2018-04-06 DIAGNOSIS — J3081 Allergic rhinitis due to animal (cat) (dog) hair and dander: Secondary | ICD-10-CM | POA: Diagnosis not present

## 2018-04-06 DIAGNOSIS — J3089 Other allergic rhinitis: Secondary | ICD-10-CM | POA: Diagnosis not present

## 2018-04-06 DIAGNOSIS — J301 Allergic rhinitis due to pollen: Secondary | ICD-10-CM | POA: Diagnosis not present

## 2018-04-14 ENCOUNTER — Other Ambulatory Visit: Payer: Self-pay

## 2018-04-14 MED ORDER — PROAIR HFA 108 (90 BASE) MCG/ACT IN AERS: INHALATION_SPRAY | RESPIRATORY_TRACT | 0 refills | Status: DC

## 2018-04-19 DIAGNOSIS — J3089 Other allergic rhinitis: Secondary | ICD-10-CM | POA: Diagnosis not present

## 2018-04-19 DIAGNOSIS — J301 Allergic rhinitis due to pollen: Secondary | ICD-10-CM | POA: Diagnosis not present

## 2018-04-19 DIAGNOSIS — J3081 Allergic rhinitis due to animal (cat) (dog) hair and dander: Secondary | ICD-10-CM | POA: Diagnosis not present

## 2018-04-22 DIAGNOSIS — J3089 Other allergic rhinitis: Secondary | ICD-10-CM | POA: Diagnosis not present

## 2018-04-22 DIAGNOSIS — J301 Allergic rhinitis due to pollen: Secondary | ICD-10-CM | POA: Diagnosis not present

## 2018-04-22 DIAGNOSIS — J3081 Allergic rhinitis due to animal (cat) (dog) hair and dander: Secondary | ICD-10-CM | POA: Diagnosis not present

## 2018-04-27 DIAGNOSIS — J301 Allergic rhinitis due to pollen: Secondary | ICD-10-CM | POA: Diagnosis not present

## 2018-04-27 DIAGNOSIS — J3081 Allergic rhinitis due to animal (cat) (dog) hair and dander: Secondary | ICD-10-CM | POA: Diagnosis not present

## 2018-04-27 DIAGNOSIS — J3089 Other allergic rhinitis: Secondary | ICD-10-CM | POA: Diagnosis not present

## 2018-05-06 DIAGNOSIS — J3089 Other allergic rhinitis: Secondary | ICD-10-CM | POA: Diagnosis not present

## 2018-05-06 DIAGNOSIS — J3081 Allergic rhinitis due to animal (cat) (dog) hair and dander: Secondary | ICD-10-CM | POA: Diagnosis not present

## 2018-05-06 DIAGNOSIS — J301 Allergic rhinitis due to pollen: Secondary | ICD-10-CM | POA: Diagnosis not present

## 2018-05-10 DIAGNOSIS — J301 Allergic rhinitis due to pollen: Secondary | ICD-10-CM | POA: Diagnosis not present

## 2018-05-10 DIAGNOSIS — J3081 Allergic rhinitis due to animal (cat) (dog) hair and dander: Secondary | ICD-10-CM | POA: Diagnosis not present

## 2018-05-10 DIAGNOSIS — J3089 Other allergic rhinitis: Secondary | ICD-10-CM | POA: Diagnosis not present

## 2018-05-18 DIAGNOSIS — J3089 Other allergic rhinitis: Secondary | ICD-10-CM | POA: Diagnosis not present

## 2018-05-18 DIAGNOSIS — J3081 Allergic rhinitis due to animal (cat) (dog) hair and dander: Secondary | ICD-10-CM | POA: Diagnosis not present

## 2018-05-18 DIAGNOSIS — J301 Allergic rhinitis due to pollen: Secondary | ICD-10-CM | POA: Diagnosis not present

## 2018-05-24 ENCOUNTER — Ambulatory Visit: Payer: Federal, State, Local not specified - PPO | Admitting: Family Medicine

## 2018-05-24 DIAGNOSIS — J3081 Allergic rhinitis due to animal (cat) (dog) hair and dander: Secondary | ICD-10-CM | POA: Diagnosis not present

## 2018-05-24 DIAGNOSIS — J301 Allergic rhinitis due to pollen: Secondary | ICD-10-CM | POA: Diagnosis not present

## 2018-05-24 DIAGNOSIS — J3089 Other allergic rhinitis: Secondary | ICD-10-CM | POA: Diagnosis not present

## 2018-05-31 ENCOUNTER — Other Ambulatory Visit: Payer: Self-pay | Admitting: Family Medicine

## 2018-05-31 DIAGNOSIS — J3081 Allergic rhinitis due to animal (cat) (dog) hair and dander: Secondary | ICD-10-CM | POA: Diagnosis not present

## 2018-05-31 DIAGNOSIS — J3089 Other allergic rhinitis: Secondary | ICD-10-CM | POA: Diagnosis not present

## 2018-05-31 DIAGNOSIS — J301 Allergic rhinitis due to pollen: Secondary | ICD-10-CM | POA: Diagnosis not present

## 2018-05-31 DIAGNOSIS — E039 Hypothyroidism, unspecified: Secondary | ICD-10-CM

## 2018-06-09 DIAGNOSIS — J3089 Other allergic rhinitis: Secondary | ICD-10-CM | POA: Diagnosis not present

## 2018-06-09 DIAGNOSIS — J301 Allergic rhinitis due to pollen: Secondary | ICD-10-CM | POA: Diagnosis not present

## 2018-06-09 DIAGNOSIS — J3081 Allergic rhinitis due to animal (cat) (dog) hair and dander: Secondary | ICD-10-CM | POA: Diagnosis not present

## 2018-06-15 DIAGNOSIS — J3081 Allergic rhinitis due to animal (cat) (dog) hair and dander: Secondary | ICD-10-CM | POA: Diagnosis not present

## 2018-06-15 DIAGNOSIS — J3089 Other allergic rhinitis: Secondary | ICD-10-CM | POA: Diagnosis not present

## 2018-06-15 DIAGNOSIS — J301 Allergic rhinitis due to pollen: Secondary | ICD-10-CM | POA: Diagnosis not present

## 2018-06-16 DIAGNOSIS — J3089 Other allergic rhinitis: Secondary | ICD-10-CM | POA: Diagnosis not present

## 2018-06-16 DIAGNOSIS — J301 Allergic rhinitis due to pollen: Secondary | ICD-10-CM | POA: Diagnosis not present

## 2018-06-16 DIAGNOSIS — J3081 Allergic rhinitis due to animal (cat) (dog) hair and dander: Secondary | ICD-10-CM | POA: Diagnosis not present

## 2018-06-23 ENCOUNTER — Other Ambulatory Visit: Payer: Self-pay | Admitting: Family Medicine

## 2018-06-23 DIAGNOSIS — J45909 Unspecified asthma, uncomplicated: Secondary | ICD-10-CM

## 2018-06-24 DIAGNOSIS — J301 Allergic rhinitis due to pollen: Secondary | ICD-10-CM | POA: Diagnosis not present

## 2018-06-24 DIAGNOSIS — J3089 Other allergic rhinitis: Secondary | ICD-10-CM | POA: Diagnosis not present

## 2018-06-30 DIAGNOSIS — J301 Allergic rhinitis due to pollen: Secondary | ICD-10-CM | POA: Diagnosis not present

## 2018-06-30 DIAGNOSIS — J3089 Other allergic rhinitis: Secondary | ICD-10-CM | POA: Diagnosis not present

## 2018-06-30 DIAGNOSIS — J3081 Allergic rhinitis due to animal (cat) (dog) hair and dander: Secondary | ICD-10-CM | POA: Diagnosis not present

## 2018-07-08 DIAGNOSIS — J301 Allergic rhinitis due to pollen: Secondary | ICD-10-CM | POA: Diagnosis not present

## 2018-07-08 DIAGNOSIS — J3089 Other allergic rhinitis: Secondary | ICD-10-CM | POA: Diagnosis not present

## 2018-07-08 DIAGNOSIS — J3081 Allergic rhinitis due to animal (cat) (dog) hair and dander: Secondary | ICD-10-CM | POA: Diagnosis not present

## 2018-07-21 DIAGNOSIS — J3081 Allergic rhinitis due to animal (cat) (dog) hair and dander: Secondary | ICD-10-CM | POA: Diagnosis not present

## 2018-07-21 DIAGNOSIS — J3089 Other allergic rhinitis: Secondary | ICD-10-CM | POA: Diagnosis not present

## 2018-07-21 DIAGNOSIS — J301 Allergic rhinitis due to pollen: Secondary | ICD-10-CM | POA: Diagnosis not present

## 2018-07-28 DIAGNOSIS — J3089 Other allergic rhinitis: Secondary | ICD-10-CM | POA: Diagnosis not present

## 2018-07-28 DIAGNOSIS — J301 Allergic rhinitis due to pollen: Secondary | ICD-10-CM | POA: Diagnosis not present

## 2018-07-28 DIAGNOSIS — J3081 Allergic rhinitis due to animal (cat) (dog) hair and dander: Secondary | ICD-10-CM | POA: Diagnosis not present

## 2018-07-31 ENCOUNTER — Other Ambulatory Visit: Payer: Self-pay | Admitting: Family Medicine

## 2018-07-31 DIAGNOSIS — E039 Hypothyroidism, unspecified: Secondary | ICD-10-CM

## 2018-07-31 DIAGNOSIS — J45909 Unspecified asthma, uncomplicated: Secondary | ICD-10-CM

## 2018-08-03 ENCOUNTER — Telehealth: Payer: Self-pay

## 2018-08-03 NOTE — Telephone Encounter (Signed)
Called to do COVID Screening for appointment tomorrow. No answer. Left a message to call back. Thanks! 

## 2018-08-04 ENCOUNTER — Ambulatory Visit (INDEPENDENT_AMBULATORY_CARE_PROVIDER_SITE_OTHER): Payer: Federal, State, Local not specified - PPO | Admitting: Family Medicine

## 2018-08-04 ENCOUNTER — Encounter: Payer: Self-pay | Admitting: Family Medicine

## 2018-08-04 ENCOUNTER — Other Ambulatory Visit: Payer: Self-pay

## 2018-08-04 VITALS — BP 121/73 | HR 64 | Temp 97.7°F | Resp 14 | Ht 69.0 in | Wt 166.0 lb

## 2018-08-04 DIAGNOSIS — J3089 Other allergic rhinitis: Secondary | ICD-10-CM | POA: Diagnosis not present

## 2018-08-04 DIAGNOSIS — J3081 Allergic rhinitis due to animal (cat) (dog) hair and dander: Secondary | ICD-10-CM | POA: Diagnosis not present

## 2018-08-04 DIAGNOSIS — Z114 Encounter for screening for human immunodeficiency virus [HIV]: Secondary | ICD-10-CM

## 2018-08-04 DIAGNOSIS — J301 Allergic rhinitis due to pollen: Secondary | ICD-10-CM | POA: Diagnosis not present

## 2018-08-04 DIAGNOSIS — E039 Hypothyroidism, unspecified: Secondary | ICD-10-CM | POA: Diagnosis not present

## 2018-08-04 DIAGNOSIS — Z1211 Encounter for screening for malignant neoplasm of colon: Secondary | ICD-10-CM

## 2018-08-04 MED ORDER — AZELASTINE HCL 0.1 % NA SOLN: 1.0000 | Freq: Two times a day (BID) | NASAL | 5 refills | Status: DC

## 2018-08-04 NOTE — Progress Notes (Signed)
  [facility]   Progress Note: General Provider: Lanae Boast, FNP  SUBJECTIVE:   Don Hurst is a 58 y.o. male who  has a past medical history of Allergy, Arthritis, Asthma, GERD (gastroesophageal reflux disease), History of chronic bronchitis, Multiple allergies, and Wears glasses.. Patient presents today for Hypothyroidism  Patient is doing well today and is without problems or concerns. He reports compliance with medications and denies side effects at the present time. He does have HM due of colonoscopy and HIV screening. He declines HIV testing today but is agreeable to Cologard.   Review of Systems  Constitutional: Negative.   HENT: Negative.   Eyes: Negative.   Respiratory: Negative.   Cardiovascular: Negative.   Gastrointestinal: Negative.   Genitourinary: Negative.   Musculoskeletal: Negative.   Skin: Negative.   Neurological: Negative.   Psychiatric/Behavioral: Negative.      OBJECTIVE: BP 121/73 (BP Location: Left Arm, Patient Position: Sitting, Cuff Size: Normal)   Pulse 64   Temp 97.7 F (36.5 C) (Oral)   Resp 14   Ht 5\' 9"  (1.753 m)   Wt 166 lb (75.3 kg)   SpO2 100%   BMI 24.51 kg/m   Wt Readings from Last 3 Encounters:  08/04/18 166 lb (75.3 kg)  11/23/17 166 lb (75.3 kg)  05/25/17 169 lb (76.7 kg)     Physical Exam Vitals signs and nursing note reviewed.  Constitutional:      General: He is not in acute distress.    Appearance: Normal appearance.  HENT:     Head: Normocephalic and atraumatic.  Eyes:     Extraocular Movements: Extraocular movements intact.     Conjunctiva/sclera: Conjunctivae normal.     Pupils: Pupils are equal, round, and reactive to light.  Cardiovascular:     Rate and Rhythm: Normal rate and regular rhythm.     Heart sounds: No murmur.  Pulmonary:     Effort: Pulmonary effort is normal.     Breath sounds: Normal breath sounds.  Musculoskeletal: Normal range of motion.   Skin:    General: Skin is warm and dry.  Neurological:     Mental Status: He is alert and oriented to person, place, and time.  Psychiatric:        Mood and Affect: Mood normal.        Behavior: Behavior normal.        Thought Content: Thought content normal.        Judgment: Judgment normal.     ASSESSMENT/PLAN:   1. Acquired hypothyroidism - Thyroid Panel With TSH  2. Encounter for screening for HIV Patient declined  3. Screen for colon cancer - Cologuard - HM CT VIRTUAL COLONOSCOPY  No medication changes warranted at the present time.     Return in about 7 months (around 03/06/2019) for hypoth.    The patient was given clear instructions to go to ER or return to medical center if symptoms do not improve, worsen or new problems develop. The patient verbalized understanding and agreed with plan of care.   Ms. [name]. Nathaneil Canary, FNP-BC Patient [facility] [address] [place], [place] [address] [phone]

## 2018-08-05 ENCOUNTER — Telehealth: Payer: Self-pay

## 2018-08-05 LAB — THYROID PANEL WITH TSH
Free Thyroxine Index: 1.1 — ABNORMAL LOW (ref 1.2–4.9)
T3 Uptake Ratio: 31 % (ref 24–39)
T4, Total: 3.4 ug/dL — ABNORMAL LOW (ref 4.5–12.0)
TSH: 9.32 u[IU]/mL — ABNORMAL HIGH (ref 0.450–4.500)

## 2018-08-05 MED ORDER — LEVOTHYROXINE SODIUM 50 MCG PO TABS: ORAL_TABLET | ORAL | 2 refills | Status: DC

## 2018-08-05 NOTE — Telephone Encounter (Signed)
-----   Message from Lanae Boast, [place] sent at 08/05/2018  9:18 AM EDT ----- Please inform patient that his thyroid labs are abnormal. I am going to increase his levothyroxine and have him come in for lab appt only in 6 weeks. Will adjust if still abnormal.

## 2018-08-05 NOTE — Telephone Encounter (Signed)
Called, no answer. Left a message for patient to call back.  

## 2018-08-05 NOTE — Progress Notes (Signed)
Please inform patient that his thyroid labs are abnormal. I am going to increase his levothyroxine and have him come in for lab appt only in 6 weeks. Will adjust if still abnormal.

## 2018-08-05 NOTE — Patient Instructions (Signed)
Hypothyroidism  Hypothyroidism is when the thyroid gland does not make enough of certain hormones (it is underactive). The thyroid gland is a small gland located in the lower front part of the neck, just in front of the windpipe (trachea). This gland makes hormones that help control how the body uses food for energy (metabolism) as well as how the heart and brain function. These hormones also play a role in keeping your bones strong. When the thyroid is underactive, it produces too little of the hormones thyroxine (T4) and triiodothyronine (T3). What are the causes? This condition may be caused by:  Hashimoto's disease. This is a disease in which the body's disease-fighting system (immune system) attacks the thyroid gland. This is the most common cause.  Viral infections.  Pregnancy.  Certain medicines.  Birth defects.  Past radiation treatments to the head or neck for cancer.  Past treatment with radioactive iodine.  Past exposure to radiation in the environment.  Past surgical removal of part or all of the thyroid.  Problems with a gland in the center of the brain (pituitary gland).  Lack of enough iodine in the diet. What increases the risk? You are more likely to develop this condition if:  You are male.  You have a family history of thyroid conditions.  You use a medicine called lithium.  You take medicines that affect the immune system (immunosuppressants). What are the signs or symptoms? Symptoms of this condition include:  Feeling as though you have no energy (lethargy).  Not being able to tolerate cold.  Weight gain that is not explained by a change in diet or exercise habits.  Lack of appetite.  Dry skin.  Coarse hair.  Menstrual irregularity.  Slowing of thought processes.  Constipation.  Sadness or depression. How is this diagnosed? This condition may be diagnosed based on:  Your symptoms, your medical history, and a physical exam.  Blood  tests. You may also have imaging tests, such as an ultrasound or MRI. How is this treated? This condition is treated with medicine that replaces the thyroid hormones that your body does not make. After you begin treatment, it may take several weeks for symptoms to go away. Follow these instructions at home:  Take over-the-counter and prescription medicines only as told by your health care provider.  If you start taking any new medicines, tell your health care provider.  Keep all follow-up visits as told by your health care provider. This is important. ? As your condition improves, your dosage of thyroid hormone medicine may change. ? You will need to have blood tests regularly so that your health care provider can monitor your condition. Contact a health care provider if:  Your symptoms do not get better with treatment.  You are taking thyroid replacement medicine and you: ? Sweat a lot. ? Have tremors. ? Feel anxious. ? Lose weight rapidly. ? Cannot tolerate heat. ? Have emotional swings. ? Have diarrhea. ? Feel weak. Get help right away if you have:  Chest pain.  An irregular heartbeat.  A rapid heartbeat.  Difficulty breathing. Summary  Hypothyroidism is when the thyroid gland does not make enough of certain hormones (it is underactive).  When the thyroid is underactive, it produces too little of the hormones thyroxine (T4) and triiodothyronine (T3).  The most common cause is Hashimoto's disease, a disease in which the body's disease-fighting system (immune system) attacks the thyroid gland. The condition can also be caused by viral infections, medicine, pregnancy, or past   radiation treatment to the head or neck.  Symptoms may include weight gain, dry skin, constipation, feeling as though you do not have energy, and not being able to tolerate cold.  This condition is treated with medicine to replace the thyroid hormones that your body does not make. This information  is not intended to replace advice given to you by your health care provider. Make sure you discuss any questions you have with your health care provider. Document Released: 02/10/2005 Document Revised: 01/21/2017 Document Reviewed: 01/21/2017 Elsevier Interactive Patient Education  2019 Elsevier Inc.  

## 2018-08-06 NOTE — Telephone Encounter (Signed)
Called, no answer. Left a message to call back. Thanks!  

## 2018-08-09 NOTE — Telephone Encounter (Signed)
Called, no answer. Left a message for patient to call back. Thanks!  

## 2018-08-10 NOTE — Telephone Encounter (Signed)
Unable to reach by phone, will send letter.

## 2018-08-11 DIAGNOSIS — J301 Allergic rhinitis due to pollen: Secondary | ICD-10-CM | POA: Diagnosis not present

## 2018-08-11 DIAGNOSIS — J3081 Allergic rhinitis due to animal (cat) (dog) hair and dander: Secondary | ICD-10-CM | POA: Diagnosis not present

## 2018-08-11 DIAGNOSIS — J3089 Other allergic rhinitis: Secondary | ICD-10-CM | POA: Diagnosis not present

## 2018-08-16 DIAGNOSIS — J301 Allergic rhinitis due to pollen: Secondary | ICD-10-CM | POA: Diagnosis not present

## 2018-08-16 DIAGNOSIS — J3089 Other allergic rhinitis: Secondary | ICD-10-CM | POA: Diagnosis not present

## 2018-08-16 DIAGNOSIS — J3081 Allergic rhinitis due to animal (cat) (dog) hair and dander: Secondary | ICD-10-CM | POA: Diagnosis not present

## 2018-08-17 ENCOUNTER — Other Ambulatory Visit: Payer: Self-pay | Admitting: Family Medicine

## 2018-08-23 DIAGNOSIS — J3081 Allergic rhinitis due to animal (cat) (dog) hair and dander: Secondary | ICD-10-CM | POA: Diagnosis not present

## 2018-08-23 DIAGNOSIS — J301 Allergic rhinitis due to pollen: Secondary | ICD-10-CM | POA: Diagnosis not present

## 2018-08-23 DIAGNOSIS — J3089 Other allergic rhinitis: Secondary | ICD-10-CM | POA: Diagnosis not present

## 2018-08-31 DIAGNOSIS — J3089 Other allergic rhinitis: Secondary | ICD-10-CM | POA: Diagnosis not present

## 2018-08-31 DIAGNOSIS — J3081 Allergic rhinitis due to animal (cat) (dog) hair and dander: Secondary | ICD-10-CM | POA: Diagnosis not present

## 2018-08-31 DIAGNOSIS — J301 Allergic rhinitis due to pollen: Secondary | ICD-10-CM | POA: Diagnosis not present

## 2018-09-15 DIAGNOSIS — J3089 Other allergic rhinitis: Secondary | ICD-10-CM | POA: Diagnosis not present

## 2018-09-15 DIAGNOSIS — J3081 Allergic rhinitis due to animal (cat) (dog) hair and dander: Secondary | ICD-10-CM | POA: Diagnosis not present

## 2018-09-15 DIAGNOSIS — J301 Allergic rhinitis due to pollen: Secondary | ICD-10-CM | POA: Diagnosis not present

## 2018-09-16 ENCOUNTER — Other Ambulatory Visit: Payer: Self-pay | Admitting: Family Medicine

## 2018-09-16 DIAGNOSIS — J45909 Unspecified asthma, uncomplicated: Secondary | ICD-10-CM

## 2018-09-21 ENCOUNTER — Other Ambulatory Visit: Payer: Self-pay | Admitting: Family Medicine

## 2018-09-22 DIAGNOSIS — J3081 Allergic rhinitis due to animal (cat) (dog) hair and dander: Secondary | ICD-10-CM | POA: Diagnosis not present

## 2018-09-22 DIAGNOSIS — J301 Allergic rhinitis due to pollen: Secondary | ICD-10-CM | POA: Diagnosis not present

## 2018-09-22 DIAGNOSIS — J3089 Other allergic rhinitis: Secondary | ICD-10-CM | POA: Diagnosis not present

## 2018-09-27 ENCOUNTER — Telehealth: Payer: Self-pay

## 2018-09-27 DIAGNOSIS — R799 Abnormal finding of blood chemistry, unspecified: Secondary | ICD-10-CM

## 2018-09-27 DIAGNOSIS — R7989 Other specified abnormal findings of blood chemistry: Secondary | ICD-10-CM

## 2018-09-27 NOTE — Telephone Encounter (Signed)
Lab orders placed.  

## 2018-09-28 DIAGNOSIS — J3081 Allergic rhinitis due to animal (cat) (dog) hair and dander: Secondary | ICD-10-CM | POA: Diagnosis not present

## 2018-09-28 DIAGNOSIS — J301 Allergic rhinitis due to pollen: Secondary | ICD-10-CM | POA: Diagnosis not present

## 2018-09-28 DIAGNOSIS — J3089 Other allergic rhinitis: Secondary | ICD-10-CM | POA: Diagnosis not present

## 2018-10-04 ENCOUNTER — Other Ambulatory Visit: Payer: Federal, State, Local not specified - PPO

## 2018-10-04 ENCOUNTER — Other Ambulatory Visit: Payer: Self-pay

## 2018-10-04 DIAGNOSIS — J3081 Allergic rhinitis due to animal (cat) (dog) hair and dander: Secondary | ICD-10-CM | POA: Diagnosis not present

## 2018-10-04 DIAGNOSIS — R799 Abnormal finding of blood chemistry, unspecified: Secondary | ICD-10-CM | POA: Diagnosis not present

## 2018-10-04 DIAGNOSIS — J3089 Other allergic rhinitis: Secondary | ICD-10-CM | POA: Diagnosis not present

## 2018-10-04 DIAGNOSIS — J301 Allergic rhinitis due to pollen: Secondary | ICD-10-CM | POA: Diagnosis not present

## 2018-10-04 DIAGNOSIS — R7989 Other specified abnormal findings of blood chemistry: Secondary | ICD-10-CM

## 2018-10-05 LAB — THYROID PANEL WITH TSH
Free Thyroxine Index: 1.3 (ref 1.2–4.9)
T3 Uptake Ratio: 33 % (ref 24–39)
T4, Total: 3.9 ug/dL — ABNORMAL LOW (ref 4.5–12.0)
TSH: 4.75 u[IU]/mL — ABNORMAL HIGH (ref 0.450–4.500)

## 2018-10-06 NOTE — Progress Notes (Signed)
Your TSH has improved.  Continue with your current medications. We will continue to monitor this.  Please remember to keep your follow up appointment. If you have problems, questions or concerns, please make an appointment to discuss. Thanks!

## 2018-10-07 ENCOUNTER — Telehealth: Payer: Self-pay

## 2018-10-07 NOTE — Telephone Encounter (Signed)
Called no answer. Left a message that labs have improved and to continue current medications and keep next scheduled appointment. Asked if any questions to call back to our office. Thanks !

## 2018-10-07 NOTE — Telephone Encounter (Signed)
-----   Message from Lanae Boast, [place] sent at 10/06/2018  4:47 PM EDT ----- Your TSH has improved.  Continue with your current medications. We will continue to monitor this.  Please remember to keep your follow up appointment. If you have problems, questions or concerns, please make an appointment to discuss. Thanks!

## 2018-10-13 DIAGNOSIS — J301 Allergic rhinitis due to pollen: Secondary | ICD-10-CM | POA: Diagnosis not present

## 2018-10-13 DIAGNOSIS — J3089 Other allergic rhinitis: Secondary | ICD-10-CM | POA: Diagnosis not present

## 2018-10-13 DIAGNOSIS — J3081 Allergic rhinitis due to animal (cat) (dog) hair and dander: Secondary | ICD-10-CM | POA: Diagnosis not present

## 2018-10-22 DIAGNOSIS — J3081 Allergic rhinitis due to animal (cat) (dog) hair and dander: Secondary | ICD-10-CM | POA: Diagnosis not present

## 2018-10-22 DIAGNOSIS — J3089 Other allergic rhinitis: Secondary | ICD-10-CM | POA: Diagnosis not present

## 2018-10-22 DIAGNOSIS — J301 Allergic rhinitis due to pollen: Secondary | ICD-10-CM | POA: Diagnosis not present

## 2018-10-28 DIAGNOSIS — J3081 Allergic rhinitis due to animal (cat) (dog) hair and dander: Secondary | ICD-10-CM | POA: Diagnosis not present

## 2018-10-28 DIAGNOSIS — J301 Allergic rhinitis due to pollen: Secondary | ICD-10-CM | POA: Diagnosis not present

## 2018-10-28 DIAGNOSIS — J3089 Other allergic rhinitis: Secondary | ICD-10-CM | POA: Diagnosis not present

## 2018-11-03 ENCOUNTER — Encounter (HOSPITAL_COMMUNITY): Payer: Self-pay | Admitting: *Deleted

## 2018-11-03 ENCOUNTER — Encounter (HOSPITAL_COMMUNITY): Payer: Self-pay

## 2018-11-03 ENCOUNTER — Other Ambulatory Visit: Payer: Self-pay | Admitting: Family Medicine

## 2018-11-03 DIAGNOSIS — E039 Hypothyroidism, unspecified: Secondary | ICD-10-CM

## 2018-11-10 ENCOUNTER — Other Ambulatory Visit: Payer: Self-pay | Admitting: Family Medicine

## 2018-11-10 DIAGNOSIS — J301 Allergic rhinitis due to pollen: Secondary | ICD-10-CM | POA: Diagnosis not present

## 2018-11-10 DIAGNOSIS — J3089 Other allergic rhinitis: Secondary | ICD-10-CM | POA: Diagnosis not present

## 2018-11-10 DIAGNOSIS — L209 Atopic dermatitis, unspecified: Secondary | ICD-10-CM

## 2018-11-10 DIAGNOSIS — J3081 Allergic rhinitis due to animal (cat) (dog) hair and dander: Secondary | ICD-10-CM | POA: Diagnosis not present

## 2018-11-10 DIAGNOSIS — B86 Scabies: Secondary | ICD-10-CM

## 2018-11-17 DIAGNOSIS — J301 Allergic rhinitis due to pollen: Secondary | ICD-10-CM | POA: Diagnosis not present

## 2018-11-17 DIAGNOSIS — J3089 Other allergic rhinitis: Secondary | ICD-10-CM | POA: Diagnosis not present

## 2018-11-17 DIAGNOSIS — J3081 Allergic rhinitis due to animal (cat) (dog) hair and dander: Secondary | ICD-10-CM | POA: Diagnosis not present

## 2018-11-22 DIAGNOSIS — H1045 Other chronic allergic conjunctivitis: Secondary | ICD-10-CM | POA: Diagnosis not present

## 2018-11-22 DIAGNOSIS — J4520 Mild intermittent asthma, uncomplicated: Secondary | ICD-10-CM | POA: Diagnosis not present

## 2018-11-22 DIAGNOSIS — J3089 Other allergic rhinitis: Secondary | ICD-10-CM | POA: Diagnosis not present

## 2018-11-22 DIAGNOSIS — R21 Rash and other nonspecific skin eruption: Secondary | ICD-10-CM | POA: Diagnosis not present

## 2018-11-22 DIAGNOSIS — J301 Allergic rhinitis due to pollen: Secondary | ICD-10-CM | POA: Diagnosis not present

## 2018-11-25 DIAGNOSIS — J301 Allergic rhinitis due to pollen: Secondary | ICD-10-CM | POA: Diagnosis not present

## 2018-11-25 DIAGNOSIS — J3089 Other allergic rhinitis: Secondary | ICD-10-CM | POA: Diagnosis not present

## 2018-11-25 DIAGNOSIS — J3081 Allergic rhinitis due to animal (cat) (dog) hair and dander: Secondary | ICD-10-CM | POA: Diagnosis not present

## 2018-12-07 DIAGNOSIS — J3081 Allergic rhinitis due to animal (cat) (dog) hair and dander: Secondary | ICD-10-CM | POA: Diagnosis not present

## 2018-12-07 DIAGNOSIS — J301 Allergic rhinitis due to pollen: Secondary | ICD-10-CM | POA: Diagnosis not present

## 2018-12-07 DIAGNOSIS — J3089 Other allergic rhinitis: Secondary | ICD-10-CM | POA: Diagnosis not present

## 2018-12-13 DIAGNOSIS — J301 Allergic rhinitis due to pollen: Secondary | ICD-10-CM | POA: Diagnosis not present

## 2018-12-13 DIAGNOSIS — J3089 Other allergic rhinitis: Secondary | ICD-10-CM | POA: Diagnosis not present

## 2018-12-13 DIAGNOSIS — J3081 Allergic rhinitis due to animal (cat) (dog) hair and dander: Secondary | ICD-10-CM | POA: Diagnosis not present

## 2018-12-15 ENCOUNTER — Other Ambulatory Visit: Payer: Self-pay | Admitting: Family Medicine

## 2018-12-15 DIAGNOSIS — J45909 Unspecified asthma, uncomplicated: Secondary | ICD-10-CM

## 2018-12-17 DIAGNOSIS — J301 Allergic rhinitis due to pollen: Secondary | ICD-10-CM | POA: Diagnosis not present

## 2018-12-20 DIAGNOSIS — J301 Allergic rhinitis due to pollen: Secondary | ICD-10-CM | POA: Diagnosis not present

## 2018-12-20 DIAGNOSIS — J3081 Allergic rhinitis due to animal (cat) (dog) hair and dander: Secondary | ICD-10-CM | POA: Diagnosis not present

## 2018-12-20 DIAGNOSIS — J3089 Other allergic rhinitis: Secondary | ICD-10-CM | POA: Diagnosis not present

## 2018-12-28 DIAGNOSIS — J301 Allergic rhinitis due to pollen: Secondary | ICD-10-CM | POA: Diagnosis not present

## 2018-12-28 DIAGNOSIS — J3081 Allergic rhinitis due to animal (cat) (dog) hair and dander: Secondary | ICD-10-CM | POA: Diagnosis not present

## 2018-12-28 DIAGNOSIS — J3089 Other allergic rhinitis: Secondary | ICD-10-CM | POA: Diagnosis not present

## 2019-01-07 DIAGNOSIS — J301 Allergic rhinitis due to pollen: Secondary | ICD-10-CM | POA: Diagnosis not present

## 2019-01-07 DIAGNOSIS — J3081 Allergic rhinitis due to animal (cat) (dog) hair and dander: Secondary | ICD-10-CM | POA: Diagnosis not present

## 2019-01-07 DIAGNOSIS — J3089 Other allergic rhinitis: Secondary | ICD-10-CM | POA: Diagnosis not present

## 2019-01-10 ENCOUNTER — Encounter: Payer: Self-pay | Admitting: Emergency Medicine

## 2019-01-10 ENCOUNTER — Other Ambulatory Visit: Payer: Self-pay

## 2019-01-10 ENCOUNTER — Ambulatory Visit
Admission: AD | Admit: 2019-01-10 | Discharge: 2019-01-10 | Disposition: A | Discharge status: Home / Self Care | Payer: Federal, State, Local not specified - PPO | Attending: Emergency Medicine | Admitting: Emergency Medicine

## 2019-01-10 DIAGNOSIS — L209 Atopic dermatitis, unspecified: Secondary | ICD-10-CM | POA: Diagnosis not present

## 2019-01-10 DIAGNOSIS — L03211 Cellulitis of face: Secondary | ICD-10-CM

## 2019-01-10 MED ORDER — TRIAMCINOLONE ACETONIDE 0.5 % EX OINT: 1.0000 "application " | TOPICAL_OINTMENT | Freq: Two times a day (BID) | CUTANEOUS | 0 refills | Status: DC

## 2019-01-10 MED ORDER — AMOXICILLIN-POT CLAVULANATE 875-125 MG PO TABS: 1.0000 | ORAL_TABLET | Freq: Two times a day (BID) | ORAL | 0 refills | Status: DC

## 2019-01-10 NOTE — Discharge Instructions (Addendum)
Keep area(s) clean and dry. Apply hot compress / towel for 5-10 minutes 3-5 times daily. Take antibiotic as prescribed with food - important to complete course. Return for worsening pain, redness, swelling, discharge, fever.  Helpful prevention tips: Keep nails short to avoid secondary skin infections. Use new, clean razors when shaving. Avoid antiperspirants - look for deodorants without aluminum.

## 2019-01-10 NOTE — ED Notes (Signed)
Patient able to ambulate independently  

## 2019-01-10 NOTE — ED Triage Notes (Signed)
Pt presents to [facility] for assessment of 2 days of right facial swelling with mild pain.

## 2019-01-10 NOTE — ED Provider Notes (Signed)
[name]    CSN: [id number] Arrival date & time: 01/10/19  1244      History   Chief Complaint Chief Complaint  Patient presents with  . APPT:  1:00  . Facial Swelling    HPI Don Hurst is a 58 y.o. male with history of asthma, allergies, eczema presenting for 2-day course of right-sided facial pain and swelling.  Has tried ice without significant relief.  Denies history of MRSA.  Does note poor dentition with right, upper molar aching that has been going off and on for the last few months.  Patient does have a dentist, this states has been difficult getting appointment second to Covid restrictions.  Patient also requesting refill of triamcinolone for his eczema: Denies current flare.   Past Medical History:  Diagnosis Date  . Allergy   . Arthritis   . Asthma   . GERD (gastroesophageal reflux disease)   . History of chronic bronchitis   . Multiple allergies   . Wears glasses     Patient Active Problem List   Diagnosis Date Noted  . 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 IBS (irritable bowel syndrome) 06/26/2011  . Degenerative joint disease involving multiple joints 06/26/2011  . Family history of colon cancer 06/26/2011  . Allergic rhinitis 06/26/2011  . Hx of gastroesophageal reflux (GERD) 06/26/2011    Past Surgical History:  Procedure Laterality Date  . COLONOSCOPY    . INGUINAL HERNIA REPAIR  02/2004   rt  . INGUINAL HERNIA REPAIR Left 02/06/2016   Procedure: LAPAROSCOPIC LEFT INGUINAL HERNIA REPAIR;  Surgeon: Clovis Riley, MD;  Location: [facility];  Service: General;  Laterality: Left;  . INSERTION OF MESH Left 02/06/2016   Procedure: INSERTION OF MESH;  Surgeon: Clovis Riley, MD;  Location: [facility];  Service: General;  Laterality: Left;  . PAROTIDECTOMY Left 02/08/2014   Procedure: LEFT SUPERFICIAL PAROTIDECTOMY;  Surgeon: Jodi Marble, MD;  Location: [facility];  Service: ENT;  Laterality:  Left;       Home Medications    Prior to Admission medications   Medication Sig Start Date End Date Taking? Authorizing Provider  PROAIR HFA 108 [phone] Base) MCG/ACT inhaler INHALE 2 PUFFS INTO THE LUNGS EVERY 4 HOURS AS NEEDED 09/22/18  Yes Lanae Boast, FNP  amoxicillin-clavulanate (AUGMENTIN) 875-125 MG tablet Take 1 tablet by mouth every 12 (twelve) hours. 01/10/19   Hall-Potvin, [place], PA-C  azelastine (ASTELIN) 0.1 % nasal spray Place 1 spray into both nostrils 2 (two) times daily. Use in each nostril as directed 08/04/18   Lanae Boast, FNP  budesonide-formoterol [facility]) 80-4.5 MCG/ACT inhaler INHALE 2 PUFFS INTO THE LUNGS TWICE DAILY 12/15/18   Azzie Glatter, FNP  Cholecalciferol (VITAMIN D PO) Take 1 tablet by mouth daily.     [provider]  levothyroxine (SYNTHROID) 50 MCG tablet TAKE 1 TABLET BY MOUTH EVERY MORNING 11/03/18   Lanae Boast, FNP  montelukast (SINGULAIR) 10 MG tablet Take 1 tablet (10 mg total) by mouth at bedtime. 11/23/17   Lanae Boast, FNP  Omega-3 Fatty Acids (FISH OIL) 1000 MG CPDR Take by mouth.    [provider]  omeprazole (PRILOSEC) 20 MG capsule Take 20 mg by mouth daily as needed (heartburn).     [provider]  PRESCRIPTION MEDICATION Allergy shots weekly    [provider]  SYMBICORT 80-4.5 MCG/ACT inhaler INHALE 2 PUFFS INTO THE LUNGS TWICE DAILY 11/05/17   Kathe Becton  M, FNP  triamcinolone ointment (KENALOG) 0.5 % Apply 1 application topically 2 (two) times daily. 01/10/19   Hall-Potvin, [place], PA-C  vitamin C (ASCORBIC ACID) 500 MG tablet Take 500 mg by mouth daily.    [provider]  VITAMIN E PO Take 1 tablet by mouth daily.     [provider]    Family History Family History  Problem Relation Age of Onset  . Hypertension Mother        per pt dx around 67 y o  . Cancer Father 76       colon  . Eczema Daughter   . Hypertension Sister     Social History Social  History   Tobacco Use  . Smoking status: Former Smoker    Packs/day: 0.25    Years: 3.00    Pack years: 0.75    Types: Cigarettes    Quit date: 03/26/1991    Years since quitting: 27.8  . Smokeless tobacco: Never Used  . Tobacco comment: smoked 1 pk every 2 wks  Substance Use Topics  . Alcohol use: No  . Drug use: No     Allergies   Lactose intolerance (gi)   Review of Systems Review of Systems  Constitutional: Negative for fatigue and fever.  HENT: Positive for dental problem and facial swelling. Negative for ear pain and sore throat.   Respiratory: Negative for cough and shortness of breath.   Cardiovascular: Negative for chest pain and palpitations.  Gastrointestinal: Negative for abdominal pain, diarrhea and vomiting.  Musculoskeletal: Negative for arthralgias and myalgias.  Skin: Negative for rash and wound.  Neurological: Negative for speech difficulty and headaches.  All other systems reviewed and are negative.    Physical Exam Triage Vital Signs ED Triage Vitals  Enc Vitals Group     BP 01/10/19 1300 125/86     Pulse Rate 01/10/19 1300 87     Resp 01/10/19 1300 18     Temp 01/10/19 1300 (!) 97.5 F (36.4 C)     Temp Source 01/10/19 1300 Temporal     SpO2 01/10/19 1300 98 %     Weight --      Height --      Head Circumference --      Peak Flow --      Pain Score 01/10/19 1259 2     Pain Loc --      Pain Edu? --      Excl. in [place]? --    No data found.  Updated Vital Signs BP 125/86 (BP Location: Right Arm)   Pulse 87   Temp (!) 97.5 F (36.4 C) (Temporal)   Resp 18   SpO2 98%   Visual Acuity Right Eye Distance:   Left Eye Distance:   Bilateral Distance:    Right Eye Near:   Left Eye Near:    Bilateral Near:     Physical Exam Constitutional:      General: He is not in acute distress. HENT:     Head: Normocephalic and atraumatic.     Right Ear: Tympanic membrane, ear canal and external ear normal.     Left Ear: Tympanic membrane, ear  canal and external ear normal.     Nose: No nasal deformity, congestion or rhinorrhea.     Comments: Turbinates nonedematous bilaterally with pink mucosa    Mouth/Throat:     Mouth: Mucous membranes are moist.     Tongue: Tongue does not deviate from midline.  Pharynx: Oropharynx is clear. Uvula midline.     Comments: No tonsillar hypertrophy or exudate. Poor dentition with right upper rear molar decayed near gumline.  No surrounding gingival edema/injection, discharge.  No fluctuance or mass appreciated. Eyes:     General: No scleral icterus.    Conjunctiva/sclera: Conjunctivae normal.     Pupils: Pupils are equal, round, and reactive to light.  Neck:     Musculoskeletal: Normal range of motion and neck supple. No muscular tenderness.  Cardiovascular:     Rate and Rhythm: Normal rate.  Pulmonary:     Effort: Pulmonary effort is normal. No respiratory distress.     Breath sounds: No wheezing.  Lymphadenopathy:     Cervical: No cervical adenopathy.  Skin:      Neurological:     Mental Status: He is alert.      UC Treatments / Results  Labs (all labs ordered are listed, but only abnormal results are displayed) Labs Reviewed - No data to display  EKG   Radiology No results found.  Procedures Procedures (including critical care time)  Medications Ordered in UC Medications - No data to display  Initial Impression / Assessment and Plan / UC Course  I have reviewed the triage vital signs and the nursing notes.  Pertinent labs & imaging results that were available during my care of the patient were reviewed by me and considered in my medical decision making (see chart for details).      Patient afebrile, nontoxic.  No active sign of dental abscess or infection, though patient is high risk.  Given overlying area of swelling, tenderness, will treat cellulitis with Augmentin to cover for oral flora as patient's dental hygiene is likely contributory.  Refill sent for  triamcinolone.  Return precautions discussed, patient verbalized understanding and is agreeable to plan. Final Clinical Impressions(s) / UC Diagnoses   Final diagnoses:  Cellulitis, face     Discharge Instructions     Keep area(s) clean and dry. Apply hot compress / towel for 5-10 minutes 3-5 times daily. Take antibiotic as prescribed with food - important to complete course. Return for worsening pain, redness, swelling, discharge, fever.  Helpful prevention tips: Keep nails short to avoid secondary skin infections. Use new, clean razors when shaving. Avoid antiperspirants - look for deodorants without aluminum.    ED Prescriptions    Medication Sig Dispense Auth. Provider   triamcinolone ointment (KENALOG) 0.5 % Apply 1 application topically 2 (two) times daily. 15 g Hall-Potvin, [place], PA-C   amoxicillin-clavulanate (AUGMENTIN) 875-125 MG tablet Take 1 tablet by mouth every 12 (twelve) hours. 14 tablet Hall-Potvin, [place], PA-C     PDMP not reviewed this encounter.   Neldon Mc [place], [place] 01/13/19 [id number]

## 2019-01-14 DIAGNOSIS — J301 Allergic rhinitis due to pollen: Secondary | ICD-10-CM | POA: Diagnosis not present

## 2019-01-14 DIAGNOSIS — J3081 Allergic rhinitis due to animal (cat) (dog) hair and dander: Secondary | ICD-10-CM | POA: Diagnosis not present

## 2019-01-14 DIAGNOSIS — J3089 Other allergic rhinitis: Secondary | ICD-10-CM | POA: Diagnosis not present

## 2019-01-17 DIAGNOSIS — J3089 Other allergic rhinitis: Secondary | ICD-10-CM | POA: Diagnosis not present

## 2019-01-17 DIAGNOSIS — J3081 Allergic rhinitis due to animal (cat) (dog) hair and dander: Secondary | ICD-10-CM | POA: Diagnosis not present

## 2019-01-17 DIAGNOSIS — J301 Allergic rhinitis due to pollen: Secondary | ICD-10-CM | POA: Diagnosis not present

## 2019-01-22 ENCOUNTER — Other Ambulatory Visit: Payer: Self-pay | Admitting: Family Medicine

## 2019-01-22 DIAGNOSIS — J45909 Unspecified asthma, uncomplicated: Secondary | ICD-10-CM

## 2019-01-22 DIAGNOSIS — E039 Hypothyroidism, unspecified: Secondary | ICD-10-CM

## 2019-01-28 DIAGNOSIS — J3081 Allergic rhinitis due to animal (cat) (dog) hair and dander: Secondary | ICD-10-CM | POA: Diagnosis not present

## 2019-01-28 DIAGNOSIS — J3089 Other allergic rhinitis: Secondary | ICD-10-CM | POA: Diagnosis not present

## 2019-01-28 DIAGNOSIS — J301 Allergic rhinitis due to pollen: Secondary | ICD-10-CM | POA: Diagnosis not present

## 2019-02-01 ENCOUNTER — Other Ambulatory Visit: Payer: Self-pay

## 2019-02-01 ENCOUNTER — Ambulatory Visit (INDEPENDENT_AMBULATORY_CARE_PROVIDER_SITE_OTHER): Payer: Federal, State, Local not specified - PPO | Admitting: Family Medicine

## 2019-02-01 ENCOUNTER — Encounter: Payer: Self-pay | Admitting: Family Medicine

## 2019-02-01 VITALS — BP 135/80 | HR 86 | Temp 98.4°F | Resp 14 | Ht 70.0 in | Wt 169.0 lb

## 2019-02-01 DIAGNOSIS — L0201 Cutaneous abscess of face: Secondary | ICD-10-CM | POA: Diagnosis not present

## 2019-02-01 DIAGNOSIS — E039 Hypothyroidism, unspecified: Secondary | ICD-10-CM | POA: Diagnosis not present

## 2019-02-01 DIAGNOSIS — J3089 Other allergic rhinitis: Secondary | ICD-10-CM | POA: Diagnosis not present

## 2019-02-01 DIAGNOSIS — J3081 Allergic rhinitis due to animal (cat) (dog) hair and dander: Secondary | ICD-10-CM | POA: Diagnosis not present

## 2019-02-01 DIAGNOSIS — J301 Allergic rhinitis due to pollen: Secondary | ICD-10-CM | POA: Diagnosis not present

## 2019-02-01 MED ORDER — DOXYCYCLINE HYCLATE 100 MG PO TABS: 100.0000 mg | ORAL_TABLET | Freq: Two times a day (BID) | ORAL | 0 refills | Status: DC

## 2019-02-01 NOTE — Progress Notes (Signed)
[name]  Established Patient Office Visit  Subjective:  Patient ID: Don Hurst, male    DOB: 1960-12-15  Age: 58 y.o. MRN: [id number]  CC:  Chief Complaint  Patient presents with  . Follow-up    hypothroidism   . Follow-up    abcess on right side of face. Was seen at Urgent care     HPI Don Hurst a very pleasant 58 year old male with a medical history significant for hypothyroidism presents for follow-up.  Patient states that he has been taking levothyroxine consistently.  He is started taking medication 30 minutes prior to eating, first thing in the morning.  Patient has been following an exercise routine over the past several months.  His exercise routine consist of cardiovascular along with strength training.  Patient has also been taking some dietary supplements, mostly for added protein. He currently denies hair loss, decreased tolerance for heat or cold, constipation, irritability, weight gain, excessive sweating, and/or diarrhea.  Patient is also following up on right facial cellulitis.  Patient reports right facial cellulitis that was noticed on January 10, 2019.  Patient went to urgent care for work-up with this problem.  He was started on Augmentin and completed antibiotic.  He continues to have sensitivity in that area and it feels as if a "knot" remains even though he has completed antibiotic without interruption in therapy.  Patient denies fever, chills, dizziness, chest pain, shortness of breath, sore throat, nausea, vomiting, or diarrhea.  Patient has not identified any provocative factors concerning facial cellulitis.  He says that he works at the [employer] and has been wearing mask consistently for protection against COVID-19.  Past Medical History:  Diagnosis Date  . Allergy   . Arthritis   . Asthma   . GERD (gastroesophageal reflux disease)   . History of chronic bronchitis   . Multiple allergies   . Wears  glasses     Past Surgical History:  Procedure Laterality Date  . COLONOSCOPY    . INGUINAL HERNIA REPAIR  02/2004   rt  . INGUINAL HERNIA REPAIR Left 02/06/2016   Procedure: LAPAROSCOPIC LEFT INGUINAL HERNIA REPAIR;  Surgeon: Clovis Riley, MD;  Location: [facility];  Service: General;  Laterality: Left;  . INSERTION OF MESH Left 02/06/2016   Procedure: INSERTION OF MESH;  Surgeon: Clovis Riley, MD;  Location: [facility];  Service: General;  Laterality: Left;  . PAROTIDECTOMY Left 02/08/2014   Procedure: LEFT SUPERFICIAL PAROTIDECTOMY;  Surgeon: Jodi Marble, MD;  Location: [facility];  Service: ENT;  Laterality: Left;    Family History  Problem Relation Age of Onset  . Hypertension Mother        per pt dx around 70 y o  . Cancer Father 36       colon  . Eczema Daughter   . Hypertension Sister     Social History   Socioeconomic History  . Marital status: Married    Spouse name: Not on file  . Number of children: Not on file  . Years of education: Not on file  . Highest education level: Not on file  Occupational History  . Not on file  Social Needs  . Financial resource strain: Not on file  . Food insecurity    Worry: Not on file    Inability: Not on file  . Transportation needs    Medical: Not on file    Non-medical: Not on file  Tobacco Use  . Smoking status: Former Smoker    Packs/day: 0.25    Years: 3.00    Pack years: 0.75    Types: Cigarettes    Quit date: 03/26/1991    Years since quitting: 27.8  . Smokeless tobacco: Never Used  . Tobacco comment: smoked 1 pk every 2 wks  Substance and Sexual Activity  . Alcohol use: No  . Drug use: No  . Sexual activity: Yes    Comment: number of sex partners in the last 12 months  1  Lifestyle  . Physical activity    Days per week: Not on file    Minutes per session: Not on file  . Stress: Not on file  Relationships  . Social Herbalist on phone: Not on file    Gets together: Not on  file    Attends religious service: Not on file    Active member of club or organization: Not on file    Attends meetings of clubs or organizations: Not on file    Relationship status: Not on file  . Intimate partner violence    Fear of current or ex partner: Not on file    Emotionally abused: Not on file    Physically abused: Not on file    Forced sexual activity: Not on file  Other Topics Concern  . Not on file  Social History Narrative   Exercise doing weights 4 times per week for 30 minutes    Outpatient Medications Prior to Visit  Medication Sig Dispense Refill  . azelastine (ASTELIN) 0.1 % nasal spray Place 1 spray into both nostrils 2 (two) times daily. Use in each nostril as directed 30 mL 5  . budesonide-formoterol (SYMBICORT) 80-4.5 MCG/ACT inhaler INHALE 2 PUFFS INTO THE LUNGS TWICE DAILY 10.2 g 0  . Cholecalciferol (VITAMIN D PO) Take 1 tablet by mouth daily.     . Cyanocobalamin (VITAMIN B 12 PO) Take by mouth.    . levothyroxine (SYNTHROID) 50 MCG tablet TAKE 1 TABLET BY MOUTH EVERY MORNING 30 tablet 2  . montelukast (SINGULAIR) 10 MG tablet Take 1 tablet (10 mg total) by mouth at bedtime. 90 tablet 3  . Omega-3 Fatty Acids (FISH OIL) 1000 MG CPDR Take by mouth.    Marland Kitchen omeprazole (PRILOSEC) 20 MG capsule Take 20 mg by mouth daily as needed (heartburn).     Marland Kitchen PRESCRIPTION MEDICATION Allergy shots weekly    . PROAIR HFA 108 (90 Base) MCG/ACT inhaler INHALE 2 PUFFS INTO THE LUNGS EVERY 4 HOURS AS NEEDED 8.5 g 0  . SYMBICORT 80-4.5 MCG/ACT inhaler INHALE 2 PUFFS INTO THE LUNGS TWICE DAILY 10.2 g 0  . triamcinolone ointment (KENALOG) 0.5 % Apply 1 application topically 2 (two) times daily. 15 g 0  . vitamin C (ASCORBIC ACID) 500 MG tablet Take 500 mg by mouth daily.    Marland Kitchen VITAMIN E PO Take 1 tablet by mouth daily.     Marland Kitchen amoxicillin-clavulanate (AUGMENTIN) 875-125 MG tablet Take 1 tablet by mouth every 12 (twelve) hours. 14 tablet 0   No facility-administered medications prior  to visit.     Allergies  Allergen Reactions  . Lactose Intolerance (Gi)     ROS Review of Systems    Objective:    Physical Exam  Constitutional: He appears well-developed and well-nourished.  HENT:  Head: Normocephalic.  Eyes: Pupils are equal, round, and reactive to light.  Cardiovascular: Normal rate and regular rhythm.  Pulmonary/Chest: Effort normal  and breath sounds normal.  Abdominal: Soft. Bowel sounds are normal.  Musculoskeletal:        General: Normal range of motion.     Cervical back: Normal range of motion.  Neurological: He is alert.  Skin: Skin is warm and dry.     0.5 cm induration, tender to touch, mild erythema, no increased warmth  Psychiatric: He has a normal mood and affect. His behavior is normal. Judgment and thought content normal.    BP 135/80 (BP Location: Left Arm, Patient Position: Sitting, Cuff Size: Normal)   Pulse 86   Temp 98.4 F (36.9 C) (Oral)   Resp 14   Ht 5\' 10"  (1.778 m)   Wt 169 lb (76.7 kg)   SpO2 100%   BMI 24.25 kg/m  Wt Readings from Last 3 Encounters:  02/01/19 169 lb (76.7 kg)  08/04/18 166 lb (75.3 kg)  11/23/17 166 lb (75.3 kg)     Health Maintenance Due  Topic Date Due  . INFLUENZA VACCINE  09/25/2018    There are no preventive care reminders to display for this patient.  Lab Results  Component Value Date   TSH 4.750 (H) 10/04/2018   Lab Results  Component Value Date   WBC 4.9 11/18/2016   HGB 14.2 11/18/2016   HCT 43.2 11/18/2016   MCV 91.7 11/18/2016   PLT 235 11/18/2016   Lab Results  Component Value Date   NA 138 11/23/2017   K 4.3 11/23/2017   CO2 22 11/23/2017   GLUCOSE 96 11/23/2017   BUN 12 11/23/2017   CREATININE 1.01 11/23/2017   BILITOT 0.3 11/23/2017   ALKPHOS 91 11/23/2017   AST 24 11/23/2017   ALT 26 11/23/2017   PROT 7.4 11/23/2017   ALBUMIN 4.3 11/23/2017   CALCIUM 9.2 11/23/2017   Lab Results  Component Value Date   CHOL 218 (H) 06/26/2011   Lab Results   Component Value Date   HDL 44 06/26/2011   Lab Results  Component Value Date   LDLCALC 158 (H) 06/26/2011   Lab Results  Component Value Date   TRIG 80 06/26/2011   Lab Results  Component Value Date   CHOLHDL 5.0 06/26/2011   Lab Results  Component Value Date   HGBA1C 5.6 04/21/2016      Assessment & Plan:   Problem List Items Addressed This Visit    None    Visit Diagnoses    Acquired hypothyroidism    -  Primary   Relevant Orders   Thyroid Panel With TSH      Acquired hypothyroidism Will review results as they become available.  No medication changes warranted at this time. - Thyroid Panel With TSH   Abscess of face Right facial cellulitis, mild erythema, tender to touch, 0.5 cm area of induration.  Patient advised to follow-up with this provider if problem worsens.  Also, advised to complete antibiotic for a total of 10 days.  Continue to apply warm moist compresses to area.  Also, remove mask when at home or alone. - doxycycline (VIBRA-TABS) 100 MG tablet; Take 1 tablet (100 mg total) by mouth 2 (two) times daily.  Dispense: 20 tablet; Refill: 0 - CBC  Follow-up: Return in about 6 months (around 08/02/2019) for hypothyroid.    [name]  APRN, MSN, FNP-C [facility] [address] [place], [place] [address] [phone]

## 2019-02-01 NOTE — Patient Instructions (Addendum)
For protein needs, do the following: Protein shake (Premier protein, Fairlife) High proteins to build muscles (tuna, peanut butter, almonds, cottage cheese (lactose free), greek yogurt (lactose free).    Follow up by phone with lab results.     Hypothyroidism  Hypothyroidism is when the thyroid gland does not make enough of certain hormones (it is underactive). The thyroid gland is a small gland located in the lower front part of the neck, just in front of the windpipe (trachea). This gland makes hormones that help control how the body uses food for energy (metabolism) as well as how the heart and brain function. These hormones also play a role in keeping your bones strong. When the thyroid is underactive, it produces too little of the hormones thyroxine (T4) and triiodothyronine (T3). What are the causes? This condition may be caused by:  Hashimoto's disease. This is a disease in which the body's disease-fighting system (immune system) attacks the thyroid gland. This is the most common cause.  Viral infections.  Pregnancy.  Certain medicines.  Birth defects.  Past radiation treatments to the head or neck for cancer.  Past treatment with radioactive iodine.  Past exposure to radiation in the environment.  Past surgical removal of part or all of the thyroid.  Problems with a gland in the center of the brain (pituitary gland).  Lack of enough iodine in the diet. What increases the risk? You are more likely to develop this condition if:  You are male.  You have a family history of thyroid conditions.  You use a medicine called lithium.  You take medicines that affect the immune system (immunosuppressants). What are the signs or symptoms? Symptoms of this condition include:  Feeling as though you have no energy (lethargy).  Not being able to tolerate cold.  Weight gain that is not explained by a change in diet or exercise habits.  Lack of appetite.  Dry  skin.  Coarse hair.  Menstrual irregularity.  Slowing of thought processes.  Constipation.  Sadness or depression. How is this diagnosed? This condition may be diagnosed based on:  Your symptoms, your medical history, and a physical exam.  Blood tests. You may also have imaging tests, such as an ultrasound or MRI. How is this treated? This condition is treated with medicine that replaces the thyroid hormones that your body does not make. After you begin treatment, it may take several weeks for symptoms to go away. Follow these instructions at home:  Take over-the-counter and prescription medicines only as told by your health care provider.  If you start taking any new medicines, tell your health care provider.  Keep all follow-up visits as told by your health care provider. This is important. ? As your condition improves, your dosage of thyroid hormone medicine may change. ? You will need to have blood tests regularly so that your health care provider can monitor your condition. Contact a health care provider if:  Your symptoms do not get better with treatment.  You are taking thyroid replacement medicine and you: ? Sweat a lot. ? Have tremors. ? Feel anxious. ? Lose weight rapidly. ? Cannot tolerate heat. ? Have emotional swings. ? Have diarrhea. ? Feel weak. Get help right away if you have:  Chest pain.  An irregular heartbeat.  A rapid heartbeat.  Difficulty breathing. Summary  Hypothyroidism is when the thyroid gland does not make enough of certain hormones (it is underactive).  When the thyroid is underactive, it produces too little of  the hormones thyroxine (T4) and triiodothyronine (T3).  The most common cause is Hashimoto's disease, a disease in which the body's disease-fighting system (immune system) attacks the thyroid gland. The condition can also be caused by viral infections, medicine, pregnancy, or past radiation treatment to the head or  neck.  Symptoms may include weight gain, dry skin, constipation, feeling as though you do not have energy, and not being able to tolerate cold.  This condition is treated with medicine to replace the thyroid hormones that your body does not make. This information is not intended to replace advice given to you by your health care provider. Make sure you discuss any questions you have with your health care provider. Document Released: 02/10/2005 Document Revised: 01/23/2017 Document Reviewed: 01/21/2017 Elsevier Patient Education  2020 [employer].

## 2019-02-02 LAB — CBC
Hematocrit: 41.6 % (ref 37.5–51.0)
Hemoglobin: 13.4 g/dL (ref 13.0–17.7)
MCH: 30.7 pg (ref 26.6–33.0)
MCHC: 32.2 g/dL (ref 31.5–35.7)
MCV: 95 fL (ref 79–97)
Platelets: 211 10*3/uL (ref 150–450)
RBC: 4.36 x10E6/uL (ref 4.14–5.80)
RDW: 12.6 % (ref 11.6–15.4)
WBC: 4.7 10*3/uL (ref 3.4–10.8)

## 2019-02-02 LAB — THYROID PANEL WITH TSH
Free Thyroxine Index: 1.5 (ref 1.2–4.9)
T3 Uptake Ratio: 34 % (ref 24–39)
T4, Total: 4.3 ug/dL — ABNORMAL LOW (ref 4.5–12.0)
TSH: 2.14 u[IU]/mL (ref 0.450–4.500)

## 2019-02-10 DIAGNOSIS — J3081 Allergic rhinitis due to animal (cat) (dog) hair and dander: Secondary | ICD-10-CM | POA: Diagnosis not present

## 2019-02-10 DIAGNOSIS — J301 Allergic rhinitis due to pollen: Secondary | ICD-10-CM | POA: Diagnosis not present

## 2019-02-10 DIAGNOSIS — J3089 Other allergic rhinitis: Secondary | ICD-10-CM | POA: Diagnosis not present

## 2019-02-21 DIAGNOSIS — J301 Allergic rhinitis due to pollen: Secondary | ICD-10-CM | POA: Diagnosis not present

## 2019-02-21 DIAGNOSIS — J3089 Other allergic rhinitis: Secondary | ICD-10-CM | POA: Diagnosis not present

## 2019-02-21 DIAGNOSIS — J3081 Allergic rhinitis due to animal (cat) (dog) hair and dander: Secondary | ICD-10-CM | POA: Diagnosis not present

## 2019-02-24 ENCOUNTER — Other Ambulatory Visit: Payer: Self-pay

## 2019-02-24 DIAGNOSIS — E039 Hypothyroidism, unspecified: Secondary | ICD-10-CM

## 2019-02-24 DIAGNOSIS — J45909 Unspecified asthma, uncomplicated: Secondary | ICD-10-CM

## 2019-02-24 MED ORDER — LEVOTHYROXINE SODIUM 50 MCG PO TABS: 50.0000 ug | ORAL_TABLET | Freq: Every morning | ORAL | 2 refills | Status: DC

## 2019-02-24 MED ORDER — BUDESONIDE-FORMOTEROL FUMARATE 80-4.5 MCG/ACT IN AERO: 2.0000 | INHALATION_SPRAY | Freq: Two times a day (BID) | RESPIRATORY_TRACT | 0 refills | Status: DC

## 2019-03-02 DIAGNOSIS — J301 Allergic rhinitis due to pollen: Secondary | ICD-10-CM | POA: Diagnosis not present

## 2019-03-02 DIAGNOSIS — J3081 Allergic rhinitis due to animal (cat) (dog) hair and dander: Secondary | ICD-10-CM | POA: Diagnosis not present

## 2019-03-02 DIAGNOSIS — J3089 Other allergic rhinitis: Secondary | ICD-10-CM | POA: Diagnosis not present

## 2019-03-06 ENCOUNTER — Other Ambulatory Visit: Payer: Self-pay

## 2019-03-06 ENCOUNTER — Ambulatory Visit
Admission: EM | Admit: 2019-03-06 | Discharge: 2019-03-06 | Disposition: A | Discharge status: Home / Self Care | Payer: Federal, State, Local not specified - PPO | Attending: Physician Assistant | Admitting: Physician Assistant

## 2019-03-06 DIAGNOSIS — Z20822 Contact with and (suspected) exposure to covid-19: Secondary | ICD-10-CM

## 2019-03-06 DIAGNOSIS — R059 Cough, unspecified: Secondary | ICD-10-CM

## 2019-03-06 DIAGNOSIS — J22 Unspecified acute lower respiratory infection: Secondary | ICD-10-CM

## 2019-03-06 DIAGNOSIS — R509 Fever, unspecified: Secondary | ICD-10-CM

## 2019-03-06 DIAGNOSIS — R05 Cough: Secondary | ICD-10-CM

## 2019-03-06 MED ORDER — BENZONATATE 200 MG PO CAPS: 200.0000 mg | ORAL_CAPSULE | Freq: Three times a day (TID) | ORAL | 0 refills | Status: DC

## 2019-03-06 MED ORDER — DOXYCYCLINE HYCLATE 100 MG PO CAPS: 100.0000 mg | ORAL_CAPSULE | Freq: Two times a day (BID) | ORAL | 0 refills | Status: DC

## 2019-03-06 MED ORDER — PROAIR HFA 108 (90 BASE) MCG/ACT IN AERS: INHALATION_SPRAY | RESPIRATORY_TRACT | 0 refills | Status: DC

## 2019-03-06 NOTE — ED Provider Notes (Signed)
[name]    CSN: [id number] Arrival date & time: 03/06/19  1500      History   Chief Complaint Chief Complaint  Patient presents with  . Fever    HPI Don Hurst is a 59 y.o. male.   59 year old male comes in for 7 day of URI symptoms. Cough, nasal congestion. Denies rhinorrhea, sore throat. Fever started 2 days ago, Tmax 100.7, responsive to antipyretic. States body aches, chills that has resolved. Diarrhea without melena, hematochezia. Denies abdominal pain, nausea, vomiting, diarrhea. Denies shortness of breath, loss of taste/smell. Theraflu, albuterol inhaler with good relief. Former smoker.      Past Medical History:  Diagnosis Date  . Allergy   . Arthritis   . Asthma   . GERD (gastroesophageal reflux disease)   . History of chronic bronchitis   . Multiple allergies   . Wears glasses     Patient Active Problem List   Diagnosis Date Noted  . 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 IBS (irritable bowel syndrome) 06/26/2011  . Degenerative joint disease involving multiple joints 06/26/2011  . Family history of colon cancer 06/26/2011  . Allergic rhinitis 06/26/2011  . Hx of gastroesophageal reflux (GERD) 06/26/2011    Past Surgical History:  Procedure Laterality Date  . COLONOSCOPY    . INGUINAL HERNIA REPAIR  02/2004   rt  . INGUINAL HERNIA REPAIR Left 02/06/2016   Procedure: LAPAROSCOPIC LEFT INGUINAL HERNIA REPAIR;  Surgeon: Clovis Riley, MD;  Location: [facility];  Service: General;  Laterality: Left;  . INSERTION OF MESH Left 02/06/2016   Procedure: INSERTION OF MESH;  Surgeon: Clovis Riley, MD;  Location: [facility];  Service: General;  Laterality: Left;  . PAROTIDECTOMY Left 02/08/2014   Procedure: LEFT SUPERFICIAL PAROTIDECTOMY;  Surgeon: Jodi Marble, MD;  Location: [facility];  Service: ENT;  Laterality: Left;       Home Medications    Prior to Admission medications   Medication  Sig Start Date End Date Taking? Authorizing Provider  azelastine (ASTELIN) 0.1 % nasal spray Place 1 spray into both nostrils 2 (two) times daily. Use in each nostril as directed 08/04/18   Lanae Boast, FNP  benzonatate (TESSALON) 200 MG capsule Take 1 capsule (200 mg total) by mouth every 8 (eight) hours. 03/06/19   Tasia Catchings, Embry Manrique V, PA-C  budesonide-formoterol (SYMBICORT) 80-4.5 MCG/ACT inhaler Inhale 2 puffs into the lungs 2 (two) times daily. 02/24/19   Dorena Dew, FNP  Cholecalciferol (VITAMIN D PO) Take 1 tablet by mouth daily.     [provider]  Cyanocobalamin (VITAMIN B 12 PO) Take by mouth.    [provider]  doxycycline (VIBRAMYCIN) 100 MG capsule Take 1 capsule (100 mg total) by mouth 2 (two) times daily. 03/06/19   Ok Edwards, PA-C  levothyroxine (SYNTHROID) 50 MCG tablet Take 1 tablet (50 mcg total) by mouth every morning. 02/24/19   Dorena Dew, FNP  montelukast (SINGULAIR) 10 MG tablet Take 1 tablet (10 mg total) by mouth at bedtime. 11/23/17   Lanae Boast, FNP  Omega-3 Fatty Acids (FISH OIL) 1000 MG CPDR Take by mouth.    [provider]  omeprazole (PRILOSEC) 20 MG capsule Take 20 mg by mouth daily as needed (heartburn).     [provider]  PRESCRIPTION MEDICATION Allergy shots weekly    [provider]  PROAIR HFA 108 [phone] Base) MCG/ACT inhaler 1-2 puffs every 4-6 hours  as needed for shortness of breath or wheezing 03/06/19   Ok Edwards, PA-C  SYMBICORT 80-4.5 MCG/ACT inhaler INHALE 2 PUFFS INTO THE LUNGS TWICE DAILY 11/05/17   Azzie Glatter, FNP  triamcinolone ointment (KENALOG) 0.5 % Apply 1 application topically 2 (two) times daily. 01/10/19   Hall-Potvin, [place], PA-C  vitamin C (ASCORBIC ACID) 500 MG tablet Take 500 mg by mouth daily.    [provider]  VITAMIN E PO Take 1 tablet by mouth daily.     [provider]    Family History Family History  Problem Relation Age of Onset  . Hypertension  Mother        per pt dx around 71 y o  . Cancer Father 33       colon  . Eczema Daughter   . Hypertension Sister     Social History Social History   Tobacco Use  . Smoking status: Former Smoker    Packs/day: 0.25    Years: 3.00    Pack years: 0.75    Types: Cigarettes    Quit date: 03/26/1991    Years since quitting: 27.9  . Smokeless tobacco: Never Used  . Tobacco comment: smoked 1 pk every 2 wks  Substance Use Topics  . Alcohol use: No  . Drug use: No     Allergies   Lactose intolerance (gi)   Review of Systems Review of Systems  Reason unable to perform ROS: See HPI as above.     Physical Exam Triage Vital Signs ED Triage Vitals  Enc Vitals Group     BP 03/06/19 1556 (!) 144/75     Pulse Rate 03/06/19 1556 (!) 113     Resp 03/06/19 1556 18     Temp 03/06/19 1556 99.2 F (37.3 C)     Temp Source 03/06/19 1556 Oral     SpO2 03/06/19 1556 95 %     Weight --      Height --      Head Circumference --      Peak Flow --      Pain Score 03/06/19 1557 0     Pain Loc --      Pain Edu? --      Excl. in [place]? --    No data found.  Updated Vital Signs BP (!) 144/75 (BP Location: Left Arm)   Pulse (!) 113   Temp 99.2 F (37.3 C) (Oral)   Resp 18   SpO2 95%   Physical Exam Constitutional:      General: He is not in acute distress.    Appearance: Normal appearance. He is not ill-appearing, toxic-appearing or diaphoretic.  HENT:     Head: Normocephalic and atraumatic.     Mouth/Throat:     Mouth: Mucous membranes are moist.     Pharynx: Oropharynx is clear. Uvula midline.  Cardiovascular:     Rate and Rhythm: Normal rate and regular rhythm.     Heart sounds: Normal heart sounds. No murmur. No friction rub. No gallop.   Pulmonary:     Effort: Pulmonary effort is normal. No accessory muscle usage, prolonged expiration, respiratory distress or retractions.     Comments: Right middle field crackles. Good air movement.  Musculoskeletal:     Cervical back:  Normal range of motion and neck supple.  Skin:    General: Skin is warm and dry.  Neurological:     General: No focal deficit present.     Mental Status:  He is alert and oriented to person, place, and time.      UC Treatments / Results  Labs (all labs ordered are listed, but only abnormal results are displayed) Labs Reviewed  NOVEL CORONAVIRUS, NAA    EKG   Radiology No results found.  Procedures Procedures (including critical care time)  Medications Ordered in UC Medications - No data to display  Initial Impression / Assessment and Plan / UC Course  I have reviewed the triage vital signs and the nursing notes.  Pertinent labs & imaging results that were available during my care of the patient were reviewed by me and considered in my medical decision making (see chart for details).    Given right middle lobe crackles, will cover for lower respiratory infection with doxycycline. COVID testing ordered. Patient to quarantine until testing results return. Other symptomatic treatment discussed. Push fluids. Return precautions given. Patient expresses understanding and agrees to plan.  Final Clinical Impressions(s) / UC Diagnoses   Final diagnoses:  Fever, unspecified  Cough   ED Prescriptions    Medication Sig Dispense Auth. Provider   doxycycline (VIBRAMYCIN) 100 MG capsule Take 1 capsule (100 mg total) by mouth 2 (two) times daily. 20 capsule Arturo Morton   PROAIR HFA 108 (90 Base) MCG/ACT inhaler 1-2 puffs every 4-6 hours as needed for shortness of breath or wheezing 8.5 g Janeane Cozart V, PA-C   benzonatate (TESSALON) 200 MG capsule Take 1 capsule (200 mg total) by mouth every 8 (eight) hours. 21 capsule Tasia Catchings, Lorelai Huyser V, PA-C     I have reviewed the PDMP during this encounter.   Ok Edwards, PA-C 03/06/19 [phone]

## 2019-03-06 NOTE — ED Triage Notes (Signed)
Pt c/o cough since last Tuesday and fever since Friday.

## 2019-03-06 NOTE — Discharge Instructions (Signed)
COVID PCR testing ordered. I would like you to quarantine until testing results. Start doxycycline to cover for bacterial infection. Continue albuterol as needed. Tessalon for cough. You can take over the counter flonase/nasacort to help with nasal congestion/drainage. Tylenol/motrin for pain and fever. Keep hydrated, urine should be clear to pale yellow in color. If experiencing shortness of breath, trouble breathing, go to the emergency department for further evaluation needed.

## 2019-03-07 ENCOUNTER — Ambulatory Visit: Payer: Federal, State, Local not specified - PPO | Admitting: Family Medicine

## 2019-03-07 LAB — NOVEL CORONAVIRUS, NAA: SARS-CoV-2, NAA: DETECTED — AB

## 2019-03-08 ENCOUNTER — Telehealth (HOSPITAL_COMMUNITY): Payer: Self-pay | Admitting: Emergency Medicine

## 2019-03-08 NOTE — Telephone Encounter (Signed)
Your test for COVID-19 was positive, meaning that you were infected with the novel coronavirus and could give the germ to others.  Please continue isolation at home for at least 10 days since the start of your symptoms. If you do not have symptoms, please isolate at home for 10 days from the day you were tested. Once you complete your 10 day quarantine, you may return to normal activities as long as you've not had a fever for over 24 hours(without taking fever reducing medicine) and your symptoms are improving. Please continue good preventive care measures, including:  frequent hand-washing, avoid touching your face, cover coughs/sneezes, stay out of crowds and keep a 6 foot distance from others.  Go to the nearest hospital emergency room if fever/cough/breathlessness are severe or illness seems like a threat to life.  Patient contacted by phone and made aware of    results. Pt verbalized understanding and had all questions answered.  Return to work 03/15/2019

## 2019-04-08 ENCOUNTER — Other Ambulatory Visit: Payer: Self-pay

## 2019-04-08 ENCOUNTER — Other Ambulatory Visit: Payer: Federal, State, Local not specified - PPO

## 2019-04-08 DIAGNOSIS — R7989 Other specified abnormal findings of blood chemistry: Secondary | ICD-10-CM | POA: Diagnosis not present

## 2019-04-08 DIAGNOSIS — E039 Hypothyroidism, unspecified: Secondary | ICD-10-CM

## 2019-04-09 LAB — THYROID PANEL WITH TSH
Free Thyroxine Index: 1.7 (ref 1.2–4.9)
T3 Uptake Ratio: 37 % (ref 24–39)
T4, Total: 4.5 ug/dL (ref 4.5–12.0)
TSH: 2.63 u[IU]/mL (ref 0.450–4.500)

## 2019-04-12 ENCOUNTER — Encounter: Payer: Self-pay | Admitting: Family Medicine

## 2019-04-12 ENCOUNTER — Ambulatory Visit (INDEPENDENT_AMBULATORY_CARE_PROVIDER_SITE_OTHER): Payer: Federal, State, Local not specified - PPO | Admitting: Family Medicine

## 2019-04-12 ENCOUNTER — Other Ambulatory Visit: Payer: Self-pay

## 2019-04-12 VITALS — BP 108/96 | HR 93 | Temp 98.2°F | Resp 16 | Ht 70.0 in | Wt 163.0 lb

## 2019-04-12 DIAGNOSIS — R5383 Other fatigue: Secondary | ICD-10-CM | POA: Diagnosis not present

## 2019-04-12 DIAGNOSIS — Z8616 Personal history of COVID-19: Secondary | ICD-10-CM

## 2019-04-12 DIAGNOSIS — M79671 Pain in right foot: Secondary | ICD-10-CM

## 2019-04-12 DIAGNOSIS — M79672 Pain in left foot: Secondary | ICD-10-CM

## 2019-04-12 DIAGNOSIS — E039 Hypothyroidism, unspecified: Secondary | ICD-10-CM

## 2019-04-12 NOTE — Progress Notes (Signed)
[facility]   Established Patient Office Visit  Subjective:  Patient ID: Don Hurst, male    DOB: 09-Apr-1960  Age: 59 y.o. MRN: [id number]  CC:  Chief Complaint  Patient presents with  . Hypothyroidism  . Sinus Problem  . Foot Pain    foot pain in bilateral   . Cough    since covid x 1 month   . Fatigue   Don Hurst, a 59 year old male with a medical history significant for acquired hypothyroidism presents for a follow up.  [name] was diagnosed with COVID-19 infection greater than 1 month ago.  Patient had a difficult time with recovery.  He continues to experience increased fatigue, persistent cough, and difficulty focusing.  Patient has attempted over-the-counter interventions for cough without maximum relief.  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  He has been unable to return to work due to increased periods of fatigue.  Patient states it is been difficult to complete activities of daily living and often finds menial tasks difficult.  Patient was not hospitalized for COVID-19 infection.  He manage symptoms at home.  He completed course of antibiotics.    Foot Pain This is a chronic problem. Associated symptoms include coughing, fatigue and myalgias. Pertinent negatives include no chest pain, chills, fever or headaches. He has tried acetaminophen for the symptoms. The treatment provided no relief.  Cough This is a recurrent problem. The problem has been gradually worsening. The problem occurs constantly. The cough is non-productive. Associated symptoms include myalgias. Pertinent negatives include no chest pain, chills, fever, headaches, postnasal drip or weight loss. The symptoms are aggravated by cold air. The treatment provided no relief. There is no history of asthma or COPD.    Past Medical History:  Diagnosis Date  . Allergy   . Arthritis   . Asthma   . GERD (gastroesophageal reflux disease)     . History of chronic bronchitis   . Multiple allergies   . Wears glasses     Past Surgical History:  Procedure Laterality Date  . COLONOSCOPY    . INGUINAL HERNIA REPAIR  02/2004   rt  . INGUINAL HERNIA REPAIR Left 02/06/2016   Procedure: LAPAROSCOPIC LEFT INGUINAL HERNIA REPAIR;  Surgeon: Clovis Riley, MD;  Location: [facility];  Service: General;  Laterality: Left;  . INSERTION OF MESH Left 02/06/2016   Procedure: INSERTION OF MESH;  Surgeon: Clovis Riley, MD;  Location: [facility];  Service: General;  Laterality: Left;  . PAROTIDECTOMY Left 02/08/2014   Procedure: LEFT SUPERFICIAL PAROTIDECTOMY;  Surgeon: Jodi Marble, MD;  Location: [facility];  Service: ENT;  Laterality: Left;    Family History  Problem Relation Age of Onset  . Hypertension Mother        per pt dx around 63 y o  . Cancer Father 28       colon  . Eczema Daughter   . Hypertension Sister     Social History   Socioeconomic History  . Marital status: Married    Spouse name: Not on file  . Number of children: Not on file  . Years of education: Not on file  . Highest education level: Not on file  Occupational History  . Not on file  Tobacco Use  . Smoking status: Former Smoker    Packs/day: 0.25    Years: 3.00    Pack years: 0.75    Types: Cigarettes  Quit date: 03/26/1991    Years since quitting: 28.0  . Smokeless tobacco: Never Used  . Tobacco comment: smoked 1 pk every 2 wks  Substance and Sexual Activity  . Alcohol use: No  . Drug use: No  . Sexual activity: Yes    Comment: number of sex partners in the last 12 months  1  Other Topics Concern  . Not on file  Social History Narrative   Exercise doing weights 4 times per week for 30 minutes   Social Determinants of Health   Financial Resource Strain:   . Difficulty of Paying Living Expenses: Not on file  Food Insecurity:   . Worried About Charity fundraiser in the Last Year: Not on file  . Ran Out of Food in the Last  Year: Not on file  Transportation Needs:   . Lack of Transportation (Medical): Not on file  . Lack of Transportation (Non-Medical): Not on file  Physical Activity:   . Days of Exercise per Week: Not on file  . Minutes of Exercise per Session: Not on file  Stress:   . Feeling of Stress : Not on file  Social Connections:   . Frequency of Communication with Friends and Family: Not on file  . Frequency of Social Gatherings with Friends and Family: Not on file  . Attends Religious Services: Not on file  . Active Member of Clubs or Organizations: Not on file  . Attends Archivist Meetings: Not on file  . Marital Status: Not on file  Intimate Partner Violence:   . Fear of Current or Ex-Partner: Not on file  . Emotionally Abused: Not on file  . Physically Abused: Not on file  . Sexually Abused: Not on file    Outpatient Medications Prior to Visit  Medication Sig Dispense Refill  . azelastine (ASTELIN) 0.1 % nasal spray Place 1 spray into both nostrils 2 (two) times daily. Use in each nostril as directed 30 mL 5  . budesonide-formoterol (SYMBICORT) 80-4.5 MCG/ACT inhaler Inhale 2 puffs into the lungs 2 (two) times daily. 10.2 g 0  . Cholecalciferol (VITAMIN D PO) Take 1 tablet by mouth daily.     . Cyanocobalamin (VITAMIN B 12 PO) Take by mouth.    . levothyroxine (SYNTHROID) 50 MCG tablet Take 1 tablet (50 mcg total) by mouth every morning. 30 tablet 2  . montelukast (SINGULAIR) 10 MG tablet Take 1 tablet (10 mg total) by mouth at bedtime. 90 tablet 3  . PROAIR HFA 108 (90 Base) MCG/ACT inhaler 1-2 puffs every 4-6 hours as needed for shortness of breath or wheezing 8.5 g 0  . SYMBICORT 80-4.5 MCG/ACT inhaler INHALE 2 PUFFS INTO THE LUNGS TWICE DAILY 10.2 g 0  . triamcinolone ointment (KENALOG) 0.5 % Apply 1 application topically 2 (two) times daily. 15 g 0  . vitamin C (ASCORBIC ACID) 500 MG tablet Take 500 mg by mouth daily.    Marland Kitchen VITAMIN E PO Take 1 tablet by mouth daily.     .  benzonatate (TESSALON) 200 MG capsule Take 1 capsule (200 mg total) by mouth every 8 (eight) hours. (Patient not taking: Reported on 04/12/2019) 21 capsule 0  . Omega-3 Fatty Acids (FISH OIL) 1000 MG CPDR Take by mouth.    Marland Kitchen omeprazole (PRILOSEC) 20 MG capsule Take 20 mg by mouth daily as needed (heartburn).     Marland Kitchen PRESCRIPTION MEDICATION Allergy shots weekly    . doxycycline (VIBRAMYCIN) 100 MG capsule Take 1 capsule (  100 mg total) by mouth 2 (two) times daily. 20 capsule 0   No facility-administered medications prior to visit.    Allergies  Allergen Reactions  . Lactose Intolerance (Gi)     ROS Review of Systems  Constitutional: Positive for fatigue. Negative for activity change, appetite change, chills, fever and weight loss.  HENT: Negative.  Negative for postnasal drip.   Respiratory: Positive for cough.   Cardiovascular: Negative.  Negative for chest pain.  Gastrointestinal: Negative.   Endocrine: Negative.  Negative for cold intolerance and heat intolerance.  Genitourinary: Negative.   Musculoskeletal: Positive for myalgias.  Neurological: Negative for headaches.  Hematological: Negative.   Psychiatric/Behavioral: Negative.       Objective:    Physical Exam  Constitutional: He is oriented to person, place, and time. He appears well-developed.  HENT:  Head: Normocephalic and atraumatic.  Eyes: Pupils are equal, round, and reactive to light.  Cardiovascular: Normal rate and regular rhythm.  Pulmonary/Chest: Effort normal and breath sounds normal.  Abdominal: Soft. Bowel sounds are normal.  Musculoskeletal:        General: Normal range of motion.     Cervical back: Normal range of motion and neck supple.  Neurological: He is alert and oriented to person, place, and time.  Skin: Skin is warm and dry.  Psychiatric: He has a normal mood and affect. His behavior is normal. Judgment and thought content normal.    BP (!) 108/96 (BP Location: Left Arm, Patient Position:  Sitting, Cuff Size: Large)   Pulse 93   Temp 98.2 F (36.8 C) (Oral)   Resp 16   Ht 5\' 10"  (1.778 m)   Wt 163 lb (73.9 kg)   SpO2 100%   BMI 23.39 kg/m  Wt Readings from Last 3 Encounters:  04/12/19 163 lb (73.9 kg)  02/01/19 169 lb (76.7 kg)  08/04/18 166 lb (75.3 kg)     There are no preventive care reminders to display for this patient.  There are no preventive care reminders to display for this patient.  Lab Results  Component Value Date   TSH 2.630 04/08/2019   Lab Results  Component Value Date   WBC 4.7 02/01/2019   HGB 13.4 02/01/2019   HCT 41.6 02/01/2019   MCV 95 02/01/2019   PLT 211 02/01/2019   Lab Results  Component Value Date   NA 138 11/23/2017   K 4.3 11/23/2017   CO2 22 11/23/2017   GLUCOSE 96 11/23/2017   BUN 12 11/23/2017   CREATININE 1.01 11/23/2017   BILITOT 0.3 11/23/2017   ALKPHOS 91 11/23/2017   AST 24 11/23/2017   ALT 26 11/23/2017   PROT 7.4 11/23/2017   ALBUMIN 4.3 11/23/2017   CALCIUM 9.2 11/23/2017   Lab Results  Component Value Date   CHOL 218 (H) 06/26/2011   Lab Results  Component Value Date   HDL 44 06/26/2011   Lab Results  Component Value Date   LDLCALC 158 (H) 06/26/2011   Lab Results  Component Value Date   TRIG 80 06/26/2011   Lab Results  Component Value Date   CHOLHDL 5.0 06/26/2011   Lab Results  Component Value Date   HGBA1C 5.6 04/21/2016      Assessment & Plan:   Problem List Items Addressed This Visit    None    Visit Diagnoses    Acquired hypothyroidism    -  Primary   Pain in both feet       Other fatigue  History of COVID-19         1. Acquired hypothyroidism Review laboratory results, within a normal range.  No medication changes warranted at this time.  Will repeat thyroid panel in 3 months.  2. Pain in both feet Patient advised to continue warm foot soaks.  Tylenol 500 mg every 6 hours as needed for mild to moderate foot pain.  Also, refrain from prolonged standing.   Recommend inserts for shoes.  If problem persists, may warrant referral to podiatry.  3. Other fatigue Patient reports increased fatigue following COVID-19 infection.  Reminded patient that overall response to COVID-19 infection varies with individuals.  Fatigue varies, and has the potential to be prolonged with COVID-19 infection.  We will continue to monitor closely.  All labs have been within normal range.  4. History of COVID-19 Patient was positive for COVID-19 infection.  He continues to be periodically symptomatic.  Continue to monitor symptoms closely.   Follow-up: Return in about 3 months (around 07/10/2019).    [name]  APRN, MSN, FNP-C [facility] [address] [place], [place] [address] [phone]

## 2019-04-12 NOTE — Patient Instructions (Addendum)
COVID-19 Vaccine Information can be found at: ShippingScam.co.uk For questions related to vaccine distribution or appointments, please email [email or web address] or call [phone].     Recommendations:  OTC Zyrtec D Nasal Saline Continue with foot soaks Recommend light exercise Balanced diet and increase water intake  Continue to rest   Cares Act (look into it)    COVID-19 COVID-19 is a respiratory infection that is caused by a virus called severe acute respiratory syndrome coronavirus 2 (SARS-CoV-2). The disease is also known as coronavirus disease or novel coronavirus. In some people, the virus may not cause any symptoms. In others, it may cause a serious infection. The infection can get worse quickly and can lead to complications, such as:  Pneumonia, or infection of the lungs.  Acute respiratory distress syndrome or ARDS. This is a condition in which fluid build-up in the lungs prevents the lungs from filling with air and passing oxygen into the blood.  Acute respiratory failure. This is a condition in which there is not enough oxygen passing from the lungs to the body or when carbon dioxide is not passing from the lungs out of the body.  Sepsis or septic shock. This is a serious bodily reaction to an infection.  Blood clotting problems.  Secondary infections due to bacteria or fungus.  Organ failure. This is when your body's organs stop working. The virus that causes COVID-19 is contagious. This means that it can spread from person to person through droplets from coughs and sneezes (respiratory secretions). What are the causes? This illness is caused by a virus. You may catch the virus by:  Breathing in droplets from an infected person. Droplets can be spread by a person breathing, speaking, singing, coughing, or sneezing.  Touching something, like a table or a doorknob, that was exposed to the virus  (contaminated) and then touching your mouth, nose, or eyes. What increases the risk? Risk for infection You are more likely to be infected with this virus if you:  Are within 6 feet (2 meters) of a person with COVID-19.  Provide care for or live with a person who is infected with COVID-19.  Spend time in crowded indoor spaces or live in shared housing. Risk for serious illness You are more likely to become seriously ill from the virus if you:  Are 67 years of age or older. The higher your age, the more you are at risk for serious illness.  Live in a nursing home or long-term care facility.  Have cancer.  Have a long-term (chronic) disease such as: ? Chronic lung disease, including chronic obstructive pulmonary disease or asthma. ? A long-term disease that lowers your body's ability to fight infection (immunocompromised). ? Heart disease, including heart failure, a condition in which the arteries that lead to the heart become narrow or blocked (coronary artery disease), a disease which makes the heart muscle thick, weak, or stiff (cardiomyopathy). ? Diabetes. ? Chronic kidney disease. ? Sickle cell disease, a condition in which red blood cells have an abnormal "sickle" shape. ? Liver disease.  Are obese. What are the signs or symptoms? Symptoms of this condition can range from mild to severe. Symptoms may appear any time from 2 to 14 days after being exposed to the virus. They include:  A fever or chills.  A cough.  Difficulty breathing.  Headaches, body aches, or muscle aches.  Runny or stuffy (congested) nose.  A sore throat.  New loss of taste or smell. Some people may also have stomach  problems, such as nausea, vomiting, or diarrhea. Other people may not have any symptoms of COVID-19. How is this diagnosed? This condition may be diagnosed based on:  Your signs and symptoms, especially if: ? You live in an area with a COVID-19 outbreak. ? You recently traveled to  or from an area where the virus is common. ? You provide care for or live with a person who was diagnosed with COVID-19. ? You were exposed to a person who was diagnosed with COVID-19.  A physical exam.  Lab tests, which may include: ? Taking a sample of fluid from the back of your nose and throat (nasopharyngeal fluid), your nose, or your throat using a swab. ? A sample of mucus from your lungs (sputum). ? Blood tests.  Imaging tests, which may include, X-rays, CT scan, or ultrasound. How is this treated? At present, there is no medicine to treat COVID-19. Medicines that treat other diseases are being used on a trial basis to see if they are effective against COVID-19. Your health care provider will talk with you about ways to treat your symptoms. For most people, the infection is mild and can be managed at home with rest, fluids, and over-the-counter medicines. Treatment for a serious infection usually takes places in a hospital intensive care unit (ICU). It may include one or more of the following treatments. These treatments are given until your symptoms improve.  Receiving fluids and medicines through an IV.  Supplemental oxygen. Extra oxygen is given through a tube in the nose, a face mask, or a hood.  Positioning you to lie on your stomach (prone position). This makes it easier for oxygen to get into the lungs.  Continuous positive airway pressure (CPAP) or bi-level positive airway pressure (BPAP) machine. This treatment uses mild air pressure to keep the airways open. A tube that is connected to a motor delivers oxygen to the body.  Ventilator. This treatment moves air into and out of the lungs by using a tube that is placed in your windpipe.  Tracheostomy. This is a procedure to create a hole in the neck so that a breathing tube can be inserted.  Extracorporeal membrane oxygenation (ECMO). This procedure gives the lungs a chance to recover by taking over the functions of the  heart and lungs. It supplies oxygen to the body and removes carbon dioxide. Follow these instructions at home: Lifestyle  If you are sick, stay home except to get medical care. Your health care provider will tell you how long to stay home. Call your health care provider before you go for medical care.  Rest at home as told by your health care provider.  Do not use any products that contain nicotine or tobacco, such as cigarettes, e-cigarettes, and chewing tobacco. If you need help quitting, ask your health care provider.  Return to your normal activities as told by your health care provider. Ask your health care provider what activities are safe for you. General instructions  Take over-the-counter and prescription medicines only as told by your health care provider.  Drink enough fluid to keep your urine pale yellow.  Keep all follow-up visits as told by your health care provider. This is important. How is this prevented?  There is no vaccine to help prevent COVID-19 infection. However, there are steps you can take to protect yourself and others from this virus. To protect yourself:   Do not travel to areas where COVID-19 is a risk. The areas where COVID-19 is reported  change often. To identify high-risk areas and travel restrictions, check the CDC travel website: FatFares.com.br  If you live in, or must travel to, an area where COVID-19 is a risk, take precautions to avoid infection. ? Stay away from people who are sick. ? Wash your hands often with soap and water for 20 seconds. If soap and water are not available, use an alcohol-based hand sanitizer. ? Avoid touching your mouth, face, eyes, or nose. ? Avoid going out in public, follow guidance from your state and local health authorities. ? If you must go out in public, wear a cloth face covering or face mask. Make sure your mask covers your nose and mouth. ? Avoid crowded indoor spaces. Stay at least 6 feet (2 meters)  away from others. ? Disinfect objects and surfaces that are frequently touched every day. This may include:  Counters and tables.  Doorknobs and light switches.  Sinks and faucets.  Electronics, such as phones, remote controls, keyboards, computers, and tablets. To protect others: If you have symptoms of COVID-19, take steps to prevent the virus from spreading to others.  If you think you have a COVID-19 infection, contact your health care provider right away. Tell your health care team that you think you may have a COVID-19 infection.  Stay home. Leave your house only to seek medical care. Do not use public transport.  Do not travel while you are sick.  Wash your hands often with soap and water for 20 seconds. If soap and water are not available, use alcohol-based hand sanitizer.  Stay away from other members of your household. Let healthy household members care for children and pets, if possible. If you have to care for children or pets, wash your hands often and wear a mask. If possible, stay in your own room, separate from others. Use a different bathroom.  Make sure that all people in your household wash their hands well and often.  Cough or sneeze into a tissue or your sleeve or elbow. Do not cough or sneeze into your hand or into the air.  Wear a cloth face covering or face mask. Make sure your mask covers your nose and mouth. Where to find more information  Centers for Disease Control and Prevention: PurpleGadgets.be  World Health Organization: https://www.castaneda.info/ Contact a health care provider if:  You live in or have traveled to an area where COVID-19 is a risk and you have symptoms of the infection.  You have had contact with someone who has COVID-19 and you have symptoms of the infection. Get help right away if:  You have trouble breathing.  You have pain or pressure in your chest.  You have confusion.  You have  bluish lips and fingernails.  You have difficulty waking from sleep.  You have symptoms that get worse. These symptoms may represent a serious problem that is an emergency. Do not wait to see if the symptoms will go away. Get medical help right away. Call your local emergency services (911 in the [place] Do not drive yourself to the hospital. Let the emergency medical personnel know if you think you have COVID-19. Summary  COVID-19 is a respiratory infection that is caused by a virus. It is also known as coronavirus disease or novel coronavirus. It can cause serious infections, such as pneumonia, acute respiratory distress syndrome, acute respiratory failure, or sepsis.  The virus that causes COVID-19 is contagious. This means that it can spread from person to person through droplets from breathing,  speaking, singing, coughing, or sneezing.  You are more likely to develop a serious illness if you are 47 years of age or older, have a weak immune system, live in a nursing home, or have chronic disease.  There is no medicine to treat COVID-19. Your health care provider will talk with you about ways to treat your symptoms.  Take steps to protect yourself and others from infection. Wash your hands often and disinfect objects and surfaces that are frequently touched every day. Stay away from people who are sick and wear a mask if you are sick. This information is not intended to replace advice given to you by your health care provider. Make sure you discuss any questions you have with your health care provider. Document Revised: 12/10/2018 Document Reviewed: 03/18/2018 Elsevier Patient Education  [place].

## 2019-05-02 ENCOUNTER — Other Ambulatory Visit: Payer: Federal, State, Local not specified - PPO

## 2019-05-03 ENCOUNTER — Ambulatory Visit: Payer: Federal, State, Local not specified - PPO | Admitting: Family Medicine

## 2019-05-04 DIAGNOSIS — J3081 Allergic rhinitis due to animal (cat) (dog) hair and dander: Secondary | ICD-10-CM | POA: Diagnosis not present

## 2019-05-04 DIAGNOSIS — J3089 Other allergic rhinitis: Secondary | ICD-10-CM | POA: Diagnosis not present

## 2019-05-04 DIAGNOSIS — J301 Allergic rhinitis due to pollen: Secondary | ICD-10-CM | POA: Diagnosis not present

## 2019-05-06 ENCOUNTER — Other Ambulatory Visit: Payer: Federal, State, Local not specified - PPO

## 2019-05-06 DIAGNOSIS — J3089 Other allergic rhinitis: Secondary | ICD-10-CM | POA: Diagnosis not present

## 2019-05-06 DIAGNOSIS — J301 Allergic rhinitis due to pollen: Secondary | ICD-10-CM | POA: Diagnosis not present

## 2019-05-06 DIAGNOSIS — J3081 Allergic rhinitis due to animal (cat) (dog) hair and dander: Secondary | ICD-10-CM | POA: Diagnosis not present

## 2019-05-09 DIAGNOSIS — J3081 Allergic rhinitis due to animal (cat) (dog) hair and dander: Secondary | ICD-10-CM | POA: Diagnosis not present

## 2019-05-09 DIAGNOSIS — J3089 Other allergic rhinitis: Secondary | ICD-10-CM | POA: Diagnosis not present

## 2019-05-09 DIAGNOSIS — J301 Allergic rhinitis due to pollen: Secondary | ICD-10-CM | POA: Diagnosis not present

## 2019-05-11 DIAGNOSIS — J3081 Allergic rhinitis due to animal (cat) (dog) hair and dander: Secondary | ICD-10-CM | POA: Diagnosis not present

## 2019-05-11 DIAGNOSIS — J301 Allergic rhinitis due to pollen: Secondary | ICD-10-CM | POA: Diagnosis not present

## 2019-05-11 DIAGNOSIS — J3089 Other allergic rhinitis: Secondary | ICD-10-CM | POA: Diagnosis not present

## 2019-05-19 DIAGNOSIS — J3089 Other allergic rhinitis: Secondary | ICD-10-CM | POA: Diagnosis not present

## 2019-05-19 DIAGNOSIS — J3081 Allergic rhinitis due to animal (cat) (dog) hair and dander: Secondary | ICD-10-CM | POA: Diagnosis not present

## 2019-05-19 DIAGNOSIS — J301 Allergic rhinitis due to pollen: Secondary | ICD-10-CM | POA: Diagnosis not present

## 2019-05-25 DIAGNOSIS — J301 Allergic rhinitis due to pollen: Secondary | ICD-10-CM | POA: Diagnosis not present

## 2019-05-25 DIAGNOSIS — J3081 Allergic rhinitis due to animal (cat) (dog) hair and dander: Secondary | ICD-10-CM | POA: Diagnosis not present

## 2019-05-25 DIAGNOSIS — J3089 Other allergic rhinitis: Secondary | ICD-10-CM | POA: Diagnosis not present

## 2019-05-30 ENCOUNTER — Other Ambulatory Visit: Payer: Self-pay

## 2019-05-30 DIAGNOSIS — J45909 Unspecified asthma, uncomplicated: Secondary | ICD-10-CM

## 2019-05-30 MED ORDER — BUDESONIDE-FORMOTEROL FUMARATE 80-4.5 MCG/ACT IN AERO: 2.0000 | INHALATION_SPRAY | Freq: Two times a day (BID) | RESPIRATORY_TRACT | 2 refills | Status: DC

## 2019-06-13 DIAGNOSIS — J3089 Other allergic rhinitis: Secondary | ICD-10-CM | POA: Diagnosis not present

## 2019-06-13 DIAGNOSIS — J301 Allergic rhinitis due to pollen: Secondary | ICD-10-CM | POA: Diagnosis not present

## 2019-06-13 DIAGNOSIS — J3081 Allergic rhinitis due to animal (cat) (dog) hair and dander: Secondary | ICD-10-CM | POA: Diagnosis not present

## 2019-06-15 ENCOUNTER — Other Ambulatory Visit: Payer: Self-pay

## 2019-06-15 DIAGNOSIS — E039 Hypothyroidism, unspecified: Secondary | ICD-10-CM

## 2019-06-15 MED ORDER — LEVOTHYROXINE SODIUM 50 MCG PO TABS: 50.0000 ug | ORAL_TABLET | Freq: Every morning | ORAL | 2 refills | Status: DC

## 2019-06-22 DIAGNOSIS — J3089 Other allergic rhinitis: Secondary | ICD-10-CM | POA: Diagnosis not present

## 2019-06-22 DIAGNOSIS — J3081 Allergic rhinitis due to animal (cat) (dog) hair and dander: Secondary | ICD-10-CM | POA: Diagnosis not present

## 2019-06-22 DIAGNOSIS — J301 Allergic rhinitis due to pollen: Secondary | ICD-10-CM | POA: Diagnosis not present

## 2019-06-28 DIAGNOSIS — J3089 Other allergic rhinitis: Secondary | ICD-10-CM | POA: Diagnosis not present

## 2019-06-28 DIAGNOSIS — J301 Allergic rhinitis due to pollen: Secondary | ICD-10-CM | POA: Diagnosis not present

## 2019-06-28 DIAGNOSIS — J3081 Allergic rhinitis due to animal (cat) (dog) hair and dander: Secondary | ICD-10-CM | POA: Diagnosis not present

## 2019-07-04 DIAGNOSIS — J3089 Other allergic rhinitis: Secondary | ICD-10-CM | POA: Diagnosis not present

## 2019-07-04 DIAGNOSIS — J301 Allergic rhinitis due to pollen: Secondary | ICD-10-CM | POA: Diagnosis not present

## 2019-07-04 DIAGNOSIS — J3081 Allergic rhinitis due to animal (cat) (dog) hair and dander: Secondary | ICD-10-CM | POA: Diagnosis not present

## 2019-07-12 ENCOUNTER — Other Ambulatory Visit: Payer: Self-pay

## 2019-07-12 ENCOUNTER — Ambulatory Visit (INDEPENDENT_AMBULATORY_CARE_PROVIDER_SITE_OTHER): Payer: Federal, State, Local not specified - PPO | Admitting: Family Medicine

## 2019-07-12 VITALS — BP 136/86 | HR 82 | Temp 98.6°F | Ht 70.0 in | Wt 173.0 lb

## 2019-07-12 DIAGNOSIS — R5383 Other fatigue: Secondary | ICD-10-CM | POA: Diagnosis not present

## 2019-07-12 DIAGNOSIS — J301 Allergic rhinitis due to pollen: Secondary | ICD-10-CM | POA: Diagnosis not present

## 2019-07-12 DIAGNOSIS — J3081 Allergic rhinitis due to animal (cat) (dog) hair and dander: Secondary | ICD-10-CM | POA: Diagnosis not present

## 2019-07-12 DIAGNOSIS — E039 Hypothyroidism, unspecified: Secondary | ICD-10-CM

## 2019-07-12 DIAGNOSIS — J3089 Other allergic rhinitis: Secondary | ICD-10-CM | POA: Diagnosis not present

## 2019-07-12 NOTE — Progress Notes (Signed)
[facility]  Established Patient Office Visit  Subjective:  Patient ID: Don Hurst, male    DOB: Nov 18, 1960  Age: 59 y.o. MRN: [id number]  CC:  Chief Complaint  Patient presents with  . Follow-up    3 month follow up for thyroid     HPI Don Hurst is a 59 year old male with a medical history significant for acquired hypothyroidism and a history of symptomatic COVID-19 infection presents for follow-up of chronic conditions.  Patient states that he continues to experience extreme fatigue since having COVID 4 months ago.   Hypothyroidism:  Patient presents for evaluation of thyroid function.  Patient has very well-controlled acquired hypothyroidism.  He has been taking medications consistently without interruption.  Patient endorses fatigue.  He denies intolerance to heat or cold, constipation, depression, weight loss or gain, excessive sweating, or hair loss.  Patient last had thyroid studies 3 months prior, which were within normal limits.    Fatigue:  Don Hurst complains of fatigue. Symptoms began several months ago with COVID 19 infection. Sentinal symptom the patient feels fatigue began with: symptoms of arthritis. He experienced foot pain constantly. Symptoms of his fatigue have been change in appetite, diffuse soft tissue aches and pains, general malaise and lack of interest in usual activities.   Patient denies fever, significant change in weight, symptoms of arthritis, unusual rashes, cold intolerance, constipation and change in hair texture. and GI blood loss. Symptoms have been intermittent. Severity has been moderate.  Past Medical History:  Diagnosis Date  . Allergy   . Arthritis   . Asthma   . GERD (gastroesophageal reflux disease)   . History of chronic bronchitis   . Multiple allergies   . Wears glasses     Past Surgical History:  Procedure Laterality Date  . COLONOSCOPY    . INGUINAL HERNIA REPAIR  02/2004   rt  .  INGUINAL HERNIA REPAIR Left 02/06/2016   Procedure: LAPAROSCOPIC LEFT INGUINAL HERNIA REPAIR;  Surgeon: Clovis Riley, MD;  Location: [facility];  Service: General;  Laterality: Left;  . INSERTION OF MESH Left 02/06/2016   Procedure: INSERTION OF MESH;  Surgeon: Clovis Riley, MD;  Location: [facility];  Service: General;  Laterality: Left;  . PAROTIDECTOMY Left 02/08/2014   Procedure: LEFT SUPERFICIAL PAROTIDECTOMY;  Surgeon: Jodi Marble, MD;  Location: [facility];  Service: ENT;  Laterality: Left;    Family History  Problem Relation Age of Onset  . Hypertension Mother        per pt dx around 72 y o  . Cancer Father 97       colon  . Eczema Daughter   . Hypertension Sister     Social History   Socioeconomic History  . Marital status: Married    Spouse name: Not on file  . Number of children: Not on file  . Years of education: Not on file  . Highest education level: Not on file  Occupational History  . Not on file  Tobacco Use  . Smoking status: Former Smoker    Packs/day: 0.25    Years: 3.00    Pack years: 0.75    Types: Cigarettes    Quit date: 03/26/1991    Years since quitting: 28.3  . Smokeless tobacco: Never Used  . Tobacco comment: smoked 1 pk every 2 wks  Substance and Sexual Activity  . Alcohol use: No  . Drug use: No  .  Sexual activity: Yes    Comment: number of sex partners in the last 57 months  1  Other Topics Concern  . Not on file  Social History Narrative   Exercise doing weights 4 times per week for 30 minutes   Social Determinants of Health   Financial Resource Strain:   . Difficulty of Paying Living Expenses:   Food Insecurity:   . Worried About Charity fundraiser in the Last Year:   . Arboriculturist in the Last Year:   Transportation Needs:   . Film/video editor (Medical):   Marland Kitchen Lack of Transportation (Non-Medical):   Physical Activity:   . Days of Exercise per Week:   . Minutes of Exercise per Session:   Stress:   .  Feeling of Stress :   Social Connections:   . Frequency of Communication with Friends and Family:   . Frequency of Social Gatherings with Friends and Family:   . Attends Religious Services:   . Active Member of Clubs or Organizations:   . Attends Archivist Meetings:   Marland Kitchen Marital Status:   Intimate Partner Violence:   . Fear of Current or Ex-Partner:   . Emotionally Abused:   Marland Kitchen Physically Abused:   . Sexually Abused:     Outpatient Medications Prior to Visit  Medication Sig Dispense Refill  . azelastine (ASTELIN) 0.1 % nasal spray Place 1 spray into both nostrils 2 (two) times daily. Use in each nostril as directed 30 mL 5  . budesonide-formoterol (SYMBICORT) 80-4.5 MCG/ACT inhaler Inhale 2 puffs into the lungs 2 (two) times daily. 10.2 g 2  . Cholecalciferol (VITAMIN D PO) Take 1 tablet by mouth daily.     . Cyanocobalamin (VITAMIN B 12 PO) Take by mouth.    . levothyroxine (SYNTHROID) 50 MCG tablet Take 1 tablet (50 mcg total) by mouth every morning. 30 tablet 2  . montelukast (SINGULAIR) 10 MG tablet Take 1 tablet (10 mg total) by mouth at bedtime. 90 tablet 3  . Omega-3 Fatty Acids (FISH OIL) 1000 MG CPDR Take by mouth.    Marland Kitchen PRESCRIPTION MEDICATION Allergy shots weekly    . PROAIR HFA 108 (90 Base) MCG/ACT inhaler 1-2 puffs every 4-6 hours as needed for shortness of breath or wheezing 8.5 g 0  . triamcinolone ointment (KENALOG) 0.5 % Apply 1 application topically 2 (two) times daily. 15 g 0  . vitamin C (ASCORBIC ACID) 500 MG tablet Take 500 mg by mouth daily.    Marland Kitchen VITAMIN E PO Take 1 tablet by mouth daily.     . SYMBICORT 80-4.5 MCG/ACT inhaler INHALE 2 PUFFS INTO THE LUNGS TWICE DAILY 10.2 g 0  . omeprazole (PRILOSEC) 20 MG capsule Take 20 mg by mouth daily as needed (heartburn).     . benzonatate (TESSALON) 200 MG capsule Take 1 capsule (200 mg total) by mouth every 8 (eight) hours. (Patient not taking: Reported on 04/12/2019) 21 capsule 0   No facility-administered  medications prior to visit.    Allergies  Allergen Reactions  . Lactose Intolerance (Gi)     ROS Review of Systems  Constitutional: Positive for fatigue.  HENT: Negative.   Eyes: Negative.   Respiratory: Negative for cough.   Cardiovascular: Negative.   Gastrointestinal: Negative.   Endocrine: Negative for cold intolerance and heat intolerance.  Musculoskeletal: Positive for arthralgias.  Skin: Negative.   Neurological: Negative.   Hematological: Negative.   Psychiatric/Behavioral: Negative.  Objective:    Physical Exam  Constitutional: He is oriented to person, place, and time. He appears well-developed and well-nourished.  HENT:  Head: Normocephalic.  Eyes: Pupils are equal, round, and reactive to light.  Cardiovascular: Normal rate and regular rhythm.  Pulmonary/Chest: Effort normal and breath sounds normal.  Abdominal: Soft. Bowel sounds are normal.  Neurological: He is alert and oriented to person, place, and time.  Skin: Skin is warm.  Psychiatric: He has a normal mood and affect. His behavior is normal. Judgment and thought content normal.    BP 136/86 (BP Location: Left Arm, Patient Position: Sitting)   Pulse 82   Temp 98.6 F (37 C) (Oral)   Ht 5\' 10"  (1.778 m)   Wt 173 lb (78.5 kg)   SpO2 100%   BMI 24.82 kg/m  Wt Readings from Last 3 Encounters:  07/12/19 173 lb (78.5 kg)  04/12/19 163 lb (73.9 kg)  02/01/19 169 lb (76.7 kg)     Health Maintenance Due  Topic Date Due  . COVID-19 Vaccine (1) Never done    There are no preventive care reminders to display for this patient.  Lab Results  Component Value Date   TSH 2.630 04/08/2019   Lab Results  Component Value Date   WBC 4.7 02/01/2019   HGB 13.4 02/01/2019   HCT 41.6 02/01/2019   MCV 95 02/01/2019   PLT 211 02/01/2019   Lab Results  Component Value Date   NA 138 11/23/2017   K 4.3 11/23/2017   CO2 22 11/23/2017   GLUCOSE 96 11/23/2017   BUN 12 11/23/2017   CREATININE  1.01 11/23/2017   BILITOT 0.3 11/23/2017   ALKPHOS 91 11/23/2017   AST 24 11/23/2017   ALT 26 11/23/2017   PROT 7.4 11/23/2017   ALBUMIN 4.3 11/23/2017   CALCIUM 9.2 11/23/2017   Lab Results  Component Value Date   CHOL 218 (H) 06/26/2011   Lab Results  Component Value Date   HDL 44 06/26/2011   Lab Results  Component Value Date   LDLCALC 158 (H) 06/26/2011   Lab Results  Component Value Date   TRIG 80 06/26/2011   Lab Results  Component Value Date   CHOLHDL 5.0 06/26/2011   Lab Results  Component Value Date   HGBA1C 5.6 04/21/2016      Assessment & Plan:   Problem List Items Addressed This Visit    None    Visit Diagnoses    Acquired hypothyroidism    -  Primary   Relevant Orders   Thyroid Panel With TSH     Acquired hypothyroidism No medication changes at this time. Will follow up by phone after reviewing laboratory results.  - Thyroid Panel With TSH  Other fatigue Continue increased rest, balanced diet and conserve energy.  Slowly restart exercise routine several days per week.  - CBC - Basic Metabolic Panel  Follow-up: Return in about 3 months (around 10/12/2019).    [name]  APRN, MSN, FNP-C [facility] [address] [place], [place] [address] [phone]

## 2019-07-13 LAB — CBC
Hematocrit: 43.9 % (ref 37.5–51.0)
Hemoglobin: 14.3 g/dL (ref 13.0–17.7)
MCH: 30.6 pg (ref 26.6–33.0)
MCHC: 32.6 g/dL (ref 31.5–35.7)
MCV: 94 fL (ref 79–97)
Platelets: 226 10*3/uL (ref 150–450)
RBC: 4.67 x10E6/uL (ref 4.14–5.80)
RDW: 13 % (ref 11.6–15.4)
WBC: 4.7 10*3/uL (ref 3.4–10.8)

## 2019-07-13 LAB — THYROID PANEL WITH TSH
Free Thyroxine Index: 1.2 (ref 1.2–4.9)
T3 Uptake Ratio: 30 % (ref 24–39)
T4, Total: 3.9 ug/dL — ABNORMAL LOW (ref 4.5–12.0)
TSH: 3.12 u[IU]/mL (ref 0.450–4.500)

## 2019-07-13 LAB — BASIC METABOLIC PANEL
BUN/Creatinine Ratio: 11 (ref 9–20)
BUN: 11 mg/dL (ref 6–24)
CO2: 21 mmol/L (ref 20–29)
Calcium: 9.4 mg/dL (ref 8.7–10.2)
Chloride: 103 mmol/L (ref 96–106)
Creatinine, Ser: 1.04 mg/dL (ref 0.76–1.27)
GFR calc Af Amer: 92 mL/min/{1.73_m2} (ref >59)
GFR calc non Af Amer: 80 mL/min/{1.73_m2} (ref >59)
Glucose: 90 mg/dL (ref 65–99)
Potassium: 4.1 mmol/L (ref 3.5–5.2)
Sodium: 139 mmol/L (ref 134–144)

## 2019-07-14 ENCOUNTER — Telehealth: Payer: Self-pay

## 2019-07-14 NOTE — Telephone Encounter (Signed)
Called and lvm for patient to call us back. 

## 2019-07-14 NOTE — Telephone Encounter (Signed)
-----   Message from Dorena Dew, [place] sent at 07/14/2019 12:43 PM EDT ----- Regarding: lab results Please inform patient that labs are mostly within normal limits. No medication changes warranted at this time. Current dosage of levothyroxine is sufficient. Follow up in office as scheduled.    [name]  APRN, MSN, FNP-C [facility] [address] [place], [place] [address] [phone]

## 2019-07-14 NOTE — Telephone Encounter (Signed)
Pt returned called , advised pt of his lab results, and to same on the dose of his levothyroxine . Pt understood results.

## 2019-07-14 NOTE — Telephone Encounter (Signed)
-----   Message from Dorena Dew, [place] sent at 07/14/2019 12:43 PM EDT ----- Regarding: lab results Please inform patient that labs are mostly within normal limits. No medication changes warranted at this time. Current dosage of levothyroxine is sufficient. Follow up in office as scheduled.    [name]  APRN, MSN, FNP-C [facility] [address] [place], [place] [address] [phone]

## 2019-07-18 DIAGNOSIS — J301 Allergic rhinitis due to pollen: Secondary | ICD-10-CM | POA: Diagnosis not present

## 2019-07-18 DIAGNOSIS — J3089 Other allergic rhinitis: Secondary | ICD-10-CM | POA: Diagnosis not present

## 2019-07-18 DIAGNOSIS — J3081 Allergic rhinitis due to animal (cat) (dog) hair and dander: Secondary | ICD-10-CM | POA: Diagnosis not present

## 2019-07-26 DIAGNOSIS — J301 Allergic rhinitis due to pollen: Secondary | ICD-10-CM | POA: Diagnosis not present

## 2019-07-26 DIAGNOSIS — J3089 Other allergic rhinitis: Secondary | ICD-10-CM | POA: Diagnosis not present

## 2019-07-26 DIAGNOSIS — J3081 Allergic rhinitis due to animal (cat) (dog) hair and dander: Secondary | ICD-10-CM | POA: Diagnosis not present

## 2019-08-01 DIAGNOSIS — J3081 Allergic rhinitis due to animal (cat) (dog) hair and dander: Secondary | ICD-10-CM | POA: Diagnosis not present

## 2019-08-01 DIAGNOSIS — J3089 Other allergic rhinitis: Secondary | ICD-10-CM | POA: Diagnosis not present

## 2019-08-01 DIAGNOSIS — J301 Allergic rhinitis due to pollen: Secondary | ICD-10-CM | POA: Diagnosis not present

## 2019-08-02 ENCOUNTER — Ambulatory Visit: Payer: Federal, State, Local not specified - PPO | Admitting: Family Medicine

## 2019-08-08 DIAGNOSIS — J3089 Other allergic rhinitis: Secondary | ICD-10-CM | POA: Diagnosis not present

## 2019-08-08 DIAGNOSIS — J301 Allergic rhinitis due to pollen: Secondary | ICD-10-CM | POA: Diagnosis not present

## 2019-08-08 DIAGNOSIS — J3081 Allergic rhinitis due to animal (cat) (dog) hair and dander: Secondary | ICD-10-CM | POA: Diagnosis not present

## 2019-08-15 DIAGNOSIS — J301 Allergic rhinitis due to pollen: Secondary | ICD-10-CM | POA: Diagnosis not present

## 2019-08-15 DIAGNOSIS — J3089 Other allergic rhinitis: Secondary | ICD-10-CM | POA: Diagnosis not present

## 2019-08-15 DIAGNOSIS — J3081 Allergic rhinitis due to animal (cat) (dog) hair and dander: Secondary | ICD-10-CM | POA: Diagnosis not present

## 2019-08-16 DIAGNOSIS — J301 Allergic rhinitis due to pollen: Secondary | ICD-10-CM | POA: Diagnosis not present

## 2019-08-16 DIAGNOSIS — J3081 Allergic rhinitis due to animal (cat) (dog) hair and dander: Secondary | ICD-10-CM | POA: Diagnosis not present

## 2019-08-16 DIAGNOSIS — J3089 Other allergic rhinitis: Secondary | ICD-10-CM | POA: Diagnosis not present

## 2019-08-22 DIAGNOSIS — J3089 Other allergic rhinitis: Secondary | ICD-10-CM | POA: Diagnosis not present

## 2019-08-22 DIAGNOSIS — J301 Allergic rhinitis due to pollen: Secondary | ICD-10-CM | POA: Diagnosis not present

## 2019-08-22 DIAGNOSIS — J3081 Allergic rhinitis due to animal (cat) (dog) hair and dander: Secondary | ICD-10-CM | POA: Diagnosis not present

## 2019-08-31 DIAGNOSIS — J301 Allergic rhinitis due to pollen: Secondary | ICD-10-CM | POA: Diagnosis not present

## 2019-08-31 DIAGNOSIS — J3081 Allergic rhinitis due to animal (cat) (dog) hair and dander: Secondary | ICD-10-CM | POA: Diagnosis not present

## 2019-08-31 DIAGNOSIS — J3089 Other allergic rhinitis: Secondary | ICD-10-CM | POA: Diagnosis not present

## 2019-09-07 DIAGNOSIS — J3081 Allergic rhinitis due to animal (cat) (dog) hair and dander: Secondary | ICD-10-CM | POA: Diagnosis not present

## 2019-09-07 DIAGNOSIS — J3089 Other allergic rhinitis: Secondary | ICD-10-CM | POA: Diagnosis not present

## 2019-09-07 DIAGNOSIS — J301 Allergic rhinitis due to pollen: Secondary | ICD-10-CM | POA: Diagnosis not present

## 2019-09-13 DIAGNOSIS — J3081 Allergic rhinitis due to animal (cat) (dog) hair and dander: Secondary | ICD-10-CM | POA: Diagnosis not present

## 2019-09-13 DIAGNOSIS — J3089 Other allergic rhinitis: Secondary | ICD-10-CM | POA: Diagnosis not present

## 2019-09-13 DIAGNOSIS — J301 Allergic rhinitis due to pollen: Secondary | ICD-10-CM | POA: Diagnosis not present

## 2019-09-19 ENCOUNTER — Other Ambulatory Visit: Payer: Self-pay

## 2019-09-19 DIAGNOSIS — E039 Hypothyroidism, unspecified: Secondary | ICD-10-CM

## 2019-09-19 MED ORDER — LEVOTHYROXINE SODIUM 50 MCG PO TABS: 50.0000 ug | ORAL_TABLET | Freq: Every morning | ORAL | 0 refills | Status: DC

## 2019-09-20 DIAGNOSIS — J3081 Allergic rhinitis due to animal (cat) (dog) hair and dander: Secondary | ICD-10-CM | POA: Diagnosis not present

## 2019-09-20 DIAGNOSIS — J301 Allergic rhinitis due to pollen: Secondary | ICD-10-CM | POA: Diagnosis not present

## 2019-09-20 DIAGNOSIS — J3089 Other allergic rhinitis: Secondary | ICD-10-CM | POA: Diagnosis not present

## 2019-10-03 DIAGNOSIS — J3089 Other allergic rhinitis: Secondary | ICD-10-CM | POA: Diagnosis not present

## 2019-10-03 DIAGNOSIS — J301 Allergic rhinitis due to pollen: Secondary | ICD-10-CM | POA: Diagnosis not present

## 2019-10-03 DIAGNOSIS — J3081 Allergic rhinitis due to animal (cat) (dog) hair and dander: Secondary | ICD-10-CM | POA: Diagnosis not present

## 2019-10-05 DIAGNOSIS — J3081 Allergic rhinitis due to animal (cat) (dog) hair and dander: Secondary | ICD-10-CM | POA: Diagnosis not present

## 2019-10-05 DIAGNOSIS — J3089 Other allergic rhinitis: Secondary | ICD-10-CM | POA: Diagnosis not present

## 2019-10-05 DIAGNOSIS — J301 Allergic rhinitis due to pollen: Secondary | ICD-10-CM | POA: Diagnosis not present

## 2019-10-12 ENCOUNTER — Other Ambulatory Visit: Payer: Self-pay | Admitting: Family Medicine

## 2019-10-12 DIAGNOSIS — J3081 Allergic rhinitis due to animal (cat) (dog) hair and dander: Secondary | ICD-10-CM | POA: Diagnosis not present

## 2019-10-12 DIAGNOSIS — J45909 Unspecified asthma, uncomplicated: Secondary | ICD-10-CM

## 2019-10-12 DIAGNOSIS — J301 Allergic rhinitis due to pollen: Secondary | ICD-10-CM | POA: Diagnosis not present

## 2019-10-12 DIAGNOSIS — J3089 Other allergic rhinitis: Secondary | ICD-10-CM | POA: Diagnosis not present

## 2019-10-18 ENCOUNTER — Encounter: Payer: Self-pay | Admitting: Family Medicine

## 2019-10-18 ENCOUNTER — Telehealth: Payer: Self-pay | Admitting: Family Medicine

## 2019-10-18 ENCOUNTER — Other Ambulatory Visit: Payer: Self-pay

## 2019-10-18 ENCOUNTER — Ambulatory Visit (INDEPENDENT_AMBULATORY_CARE_PROVIDER_SITE_OTHER): Payer: Federal, State, Local not specified - PPO | Admitting: Family Medicine

## 2019-10-18 VITALS — BP 133/85 | HR 80 | Temp 97.5°F | Ht 70.0 in | Wt 170.6 lb

## 2019-10-18 DIAGNOSIS — J301 Allergic rhinitis due to pollen: Secondary | ICD-10-CM | POA: Diagnosis not present

## 2019-10-18 DIAGNOSIS — E039 Hypothyroidism, unspecified: Secondary | ICD-10-CM

## 2019-10-18 DIAGNOSIS — J3089 Other allergic rhinitis: Secondary | ICD-10-CM | POA: Diagnosis not present

## 2019-10-18 DIAGNOSIS — J45909 Unspecified asthma, uncomplicated: Secondary | ICD-10-CM

## 2019-10-18 DIAGNOSIS — J3081 Allergic rhinitis due to animal (cat) (dog) hair and dander: Secondary | ICD-10-CM | POA: Diagnosis not present

## 2019-10-18 MED ORDER — AZELASTINE HCL 0.1 % NA SOLN: 1.0000 | Freq: Two times a day (BID) | NASAL | 5 refills | Status: DC

## 2019-10-18 MED ORDER — BUDESONIDE-FORMOTEROL FUMARATE 80-4.5 MCG/ACT IN AERO: 2.0000 | INHALATION_SPRAY | Freq: Two times a day (BID) | RESPIRATORY_TRACT | 2 refills | Status: DC

## 2019-10-18 NOTE — Progress Notes (Signed)
Patient [facility]   Subjective:    Patient ID: Don Hurst, male    DOB: [birth year], 59 y.o.   MRN: [id number]  Mr. Don Hurst is a very pleasant 59 year old male with a medical history significant for acquired hypothyroidism.  Patient says that he has been doing well and does not have complaints on today.  Patient's most recent thyroid panel was largely within normal limits.    Thyroid Problem Presents for follow-up visit. Patient reports no cold intolerance, constipation, depressed mood, diarrhea, dry skin, fatigue, hair loss, heat intolerance, hoarse voice, leg swelling, nail problem, palpitations, tremors, visual change, weight gain or weight loss. The symptoms have been stable.   . Past Medical History:  Diagnosis Date  . Allergy   . Arthritis   . Asthma   . GERD (gastroesophageal reflux disease)   . History of chronic bronchitis   . Multiple allergies   . Wears glasses    Social History   Socioeconomic History  . Marital status: Married    Spouse name: Not on file  . Number of children: Not on file  . Years of education: Not on file  . Highest education level: Not on file  Occupational History  . Not on file  Tobacco Use  . Smoking status: Former Smoker    Packs/day: 0.25    Years: 3.00    Pack years: 0.75    Types: Cigarettes    Quit date: 03/26/1991    Years since quitting: 28.5  . Smokeless tobacco: Never Used  . Tobacco comment: smoked 1 pk every 2 wks  Vaping Use  . Vaping Use: Never used  Substance and Sexual Activity  . Alcohol use: No  . Drug use: No  . Sexual activity: Yes    Comment: number of sex partners in the last 12 months  1  Other Topics Concern  . Not on file  Social History Narrative   Exercise doing weights 4 times per week for 30 minutes   Social Determinants of Health   Financial Resource Strain:   . Difficulty of Paying Living Expenses: Not on file  Food Insecurity:   . Worried About  Charity fundraiser in the Last Year: Not on file  . Ran Out of Food in the Last Year: Not on file  Transportation Needs:   . Lack of Transportation (Medical): Not on file  . Lack of Transportation (Non-Medical): Not on file  Physical Activity:   . Days of Exercise per Week: Not on file  . Minutes of Exercise per Session: Not on file  Stress:   . Feeling of Stress : Not on file  Social Connections:   . Frequency of Communication with Friends and Family: Not on file  . Frequency of Social Gatherings with Friends and Family: Not on file  . Attends Religious Services: Not on file  . Active Member of Clubs or Organizations: Not on file  . Attends Archivist Meetings: Not on file  . Marital Status: Not on file  Intimate Partner Violence:   . Fear of Current or Ex-Partner: Not on file  . Emotionally Abused: Not on file  . Physically Abused: Not on file  . Sexually Abused: Not on file   Immunization History  Administered Date(s) Administered  . PFIZER SARS-COV-2 Vaccination 05/15/2019, 06/05/2019  . Td 08/16/2004  . Tdap 06/26/2011, 04/21/2016    Review of Systems  Constitutional: Negative for fatigue, fever, unexpected weight change,  weight gain and weight loss.  HENT: Negative.  Negative for hoarse voice.   Respiratory: Negative.   Cardiovascular: Negative.  Negative for chest pain, palpitations and leg swelling.  Gastrointestinal: Negative.  Negative for constipation and diarrhea.  Endocrine: Negative.  Negative for cold intolerance and heat intolerance.  Genitourinary: Negative.   Musculoskeletal: Negative.   Skin: Negative.   Allergic/Immunologic: Negative.  Negative for immunocompromised state.  Neurological: Negative.  Negative for tremors.  Hematological: Negative.   Psychiatric/Behavioral: Negative.        Objective:   Physical Exam Constitutional:      Appearance: Normal appearance.  HENT:     Head: Normocephalic.     Mouth/Throat:     Mouth: Mucous  membranes are moist.     Pharynx: Oropharynx is clear.  Eyes:     Pupils: Pupils are equal, round, and reactive to light.  Cardiovascular:     Rate and Rhythm: Normal rate and regular rhythm.     Pulses: Normal pulses.     Heart sounds: Normal heart sounds.  Pulmonary:     Effort: Pulmonary effort is normal.  Abdominal:     General: Abdomen is flat. Bowel sounds are normal.  Skin:    General: Skin is warm.  Neurological:     General: No focal deficit present.     Mental Status: He is alert. Mental status is at baseline.  Psychiatric:        Mood and Affect: Mood normal.        Behavior: Behavior normal.        Thought Content: Thought content normal.        Judgment: Judgment normal.      BP 138/84   Pulse 80   Temp (!) 97.5 F (36.4 C) (Temporal)   Ht 5\' 10"  (1.778 m)   Wt 170 lb 9.6 oz (77.4 kg)   SpO2 100%   BMI 24.48 kg/m   Assessment & Plan:  Acquired hypothyroidism - Thyroid Panel With TSH  Moderate asthma without complication, unspecified whether persistent - budesonide-formoterol (SYMBICORT) 80-4.5 MCG/ACT inhaler; Inhale 2 puffs into the lungs 2 (two) times daily.  Dispense: 10.2 g; Refill: 2    [name]  APRN, MSN, FNP-C Patient [facility] [address] [place], [place] [address] [phone]

## 2019-10-19 ENCOUNTER — Other Ambulatory Visit: Payer: Self-pay | Admitting: Family Medicine

## 2019-10-19 DIAGNOSIS — E039 Hypothyroidism, unspecified: Secondary | ICD-10-CM

## 2019-10-19 LAB — THYROID PANEL WITH TSH
Free Thyroxine Index: 1.2 (ref 1.2–4.9)
T3 Uptake Ratio: 29 % (ref 24–39)
T4, Total: 4.3 ug/dL — ABNORMAL LOW (ref 4.5–12.0)
TSH: 4.93 u[IU]/mL — ABNORMAL HIGH (ref 0.450–4.500)

## 2019-10-19 MED ORDER — LEVOTHYROXINE SODIUM 75 MCG PO TABS: 75.0000 ug | ORAL_TABLET | Freq: Every morning | ORAL | 2 refills | Status: DC

## 2019-10-19 NOTE — Telephone Encounter (Signed)
DOne

## 2019-10-19 NOTE — Progress Notes (Signed)
Don Hurst is a 59 year old male with a medical history significant for acquired hypothyroidism that presented for follow-up on 10/18/2019.  On review of patient's labs, TSH was found to be mildly elevated.  Levothyroxine was increased to 75 mcg daily.  Patient will follow-up in 4 weeks to repeat TSH.  Discussed with Don Hurst at length, he expressed understanding.  Meds ordered this encounter  Medications  . levothyroxine (SYNTHROID) 75 MCG tablet    Sig: Take 1 tablet (75 mcg total) by mouth every morning.    Dispense:  30 tablet    Refill:  2    Order Specific Question:   Supervising Provider    Answer:   Tresa Garter [[id number]]     [name]  APRN, MSN, FNP-C Patient [facility] [address] [place], [place] [address] [phone]

## 2019-11-02 DIAGNOSIS — J3089 Other allergic rhinitis: Secondary | ICD-10-CM | POA: Diagnosis not present

## 2019-11-02 DIAGNOSIS — J3081 Allergic rhinitis due to animal (cat) (dog) hair and dander: Secondary | ICD-10-CM | POA: Diagnosis not present

## 2019-11-02 DIAGNOSIS — J301 Allergic rhinitis due to pollen: Secondary | ICD-10-CM | POA: Diagnosis not present

## 2019-11-08 ENCOUNTER — Other Ambulatory Visit: Payer: Self-pay

## 2019-11-08 ENCOUNTER — Ambulatory Visit (INDEPENDENT_AMBULATORY_CARE_PROVIDER_SITE_OTHER): Payer: Federal, State, Local not specified - PPO | Admitting: Family Medicine

## 2019-11-08 DIAGNOSIS — Z23 Encounter for immunization: Secondary | ICD-10-CM | POA: Diagnosis not present

## 2019-11-08 NOTE — Progress Notes (Signed)
Patient received Influenza vaccine. Tolerated well.  Will monitor for 30 mins.

## 2019-11-09 DIAGNOSIS — J3089 Other allergic rhinitis: Secondary | ICD-10-CM | POA: Diagnosis not present

## 2019-11-09 DIAGNOSIS — J301 Allergic rhinitis due to pollen: Secondary | ICD-10-CM | POA: Diagnosis not present

## 2019-11-09 DIAGNOSIS — J3081 Allergic rhinitis due to animal (cat) (dog) hair and dander: Secondary | ICD-10-CM | POA: Diagnosis not present

## 2019-11-15 DIAGNOSIS — J3081 Allergic rhinitis due to animal (cat) (dog) hair and dander: Secondary | ICD-10-CM | POA: Diagnosis not present

## 2019-11-15 DIAGNOSIS — J3089 Other allergic rhinitis: Secondary | ICD-10-CM | POA: Diagnosis not present

## 2019-11-15 DIAGNOSIS — J301 Allergic rhinitis due to pollen: Secondary | ICD-10-CM | POA: Diagnosis not present

## 2019-11-21 DIAGNOSIS — H1045 Other chronic allergic conjunctivitis: Secondary | ICD-10-CM | POA: Diagnosis not present

## 2019-11-21 DIAGNOSIS — J3089 Other allergic rhinitis: Secondary | ICD-10-CM | POA: Diagnosis not present

## 2019-11-21 DIAGNOSIS — J301 Allergic rhinitis due to pollen: Secondary | ICD-10-CM | POA: Diagnosis not present

## 2019-11-21 DIAGNOSIS — J3081 Allergic rhinitis due to animal (cat) (dog) hair and dander: Secondary | ICD-10-CM | POA: Diagnosis not present

## 2019-11-21 DIAGNOSIS — J4520 Mild intermittent asthma, uncomplicated: Secondary | ICD-10-CM | POA: Diagnosis not present

## 2019-11-28 DIAGNOSIS — J301 Allergic rhinitis due to pollen: Secondary | ICD-10-CM | POA: Diagnosis not present

## 2019-11-28 DIAGNOSIS — J3089 Other allergic rhinitis: Secondary | ICD-10-CM | POA: Diagnosis not present

## 2019-11-28 DIAGNOSIS — J3081 Allergic rhinitis due to animal (cat) (dog) hair and dander: Secondary | ICD-10-CM | POA: Diagnosis not present

## 2019-12-05 DIAGNOSIS — J3081 Allergic rhinitis due to animal (cat) (dog) hair and dander: Secondary | ICD-10-CM | POA: Diagnosis not present

## 2019-12-05 DIAGNOSIS — J3089 Other allergic rhinitis: Secondary | ICD-10-CM | POA: Diagnosis not present

## 2019-12-05 DIAGNOSIS — J301 Allergic rhinitis due to pollen: Secondary | ICD-10-CM | POA: Diagnosis not present

## 2019-12-13 DIAGNOSIS — J3081 Allergic rhinitis due to animal (cat) (dog) hair and dander: Secondary | ICD-10-CM | POA: Diagnosis not present

## 2019-12-13 DIAGNOSIS — J301 Allergic rhinitis due to pollen: Secondary | ICD-10-CM | POA: Diagnosis not present

## 2019-12-13 DIAGNOSIS — J3089 Other allergic rhinitis: Secondary | ICD-10-CM | POA: Diagnosis not present

## 2019-12-20 DIAGNOSIS — J301 Allergic rhinitis due to pollen: Secondary | ICD-10-CM | POA: Diagnosis not present

## 2019-12-20 DIAGNOSIS — J3081 Allergic rhinitis due to animal (cat) (dog) hair and dander: Secondary | ICD-10-CM | POA: Diagnosis not present

## 2019-12-20 DIAGNOSIS — J3089 Other allergic rhinitis: Secondary | ICD-10-CM | POA: Diagnosis not present

## 2019-12-30 DIAGNOSIS — J3081 Allergic rhinitis due to animal (cat) (dog) hair and dander: Secondary | ICD-10-CM | POA: Diagnosis not present

## 2019-12-30 DIAGNOSIS — J3089 Other allergic rhinitis: Secondary | ICD-10-CM | POA: Diagnosis not present

## 2019-12-30 DIAGNOSIS — J301 Allergic rhinitis due to pollen: Secondary | ICD-10-CM | POA: Diagnosis not present

## 2020-01-02 DIAGNOSIS — J3081 Allergic rhinitis due to animal (cat) (dog) hair and dander: Secondary | ICD-10-CM | POA: Diagnosis not present

## 2020-01-02 DIAGNOSIS — J3089 Other allergic rhinitis: Secondary | ICD-10-CM | POA: Diagnosis not present

## 2020-01-02 DIAGNOSIS — J301 Allergic rhinitis due to pollen: Secondary | ICD-10-CM | POA: Diagnosis not present

## 2020-01-09 DIAGNOSIS — J3089 Other allergic rhinitis: Secondary | ICD-10-CM | POA: Diagnosis not present

## 2020-01-09 DIAGNOSIS — J301 Allergic rhinitis due to pollen: Secondary | ICD-10-CM | POA: Diagnosis not present

## 2020-01-09 DIAGNOSIS — J3081 Allergic rhinitis due to animal (cat) (dog) hair and dander: Secondary | ICD-10-CM | POA: Diagnosis not present

## 2020-01-16 DIAGNOSIS — J301 Allergic rhinitis due to pollen: Secondary | ICD-10-CM | POA: Diagnosis not present

## 2020-01-16 DIAGNOSIS — J3089 Other allergic rhinitis: Secondary | ICD-10-CM | POA: Diagnosis not present

## 2020-01-16 DIAGNOSIS — J3081 Allergic rhinitis due to animal (cat) (dog) hair and dander: Secondary | ICD-10-CM | POA: Diagnosis not present

## 2020-01-18 ENCOUNTER — Other Ambulatory Visit: Payer: Self-pay

## 2020-01-18 ENCOUNTER — Other Ambulatory Visit: Payer: Federal, State, Local not specified - PPO

## 2020-01-18 DIAGNOSIS — R7989 Other specified abnormal findings of blood chemistry: Secondary | ICD-10-CM

## 2020-01-19 LAB — TSH: TSH: 4.73 u[IU]/mL — ABNORMAL HIGH (ref 0.450–4.500)

## 2020-01-23 DIAGNOSIS — J3081 Allergic rhinitis due to animal (cat) (dog) hair and dander: Secondary | ICD-10-CM | POA: Diagnosis not present

## 2020-01-23 DIAGNOSIS — J301 Allergic rhinitis due to pollen: Secondary | ICD-10-CM | POA: Diagnosis not present

## 2020-01-23 DIAGNOSIS — J3089 Other allergic rhinitis: Secondary | ICD-10-CM | POA: Diagnosis not present

## 2020-01-24 ENCOUNTER — Other Ambulatory Visit: Payer: Self-pay | Admitting: Family Medicine

## 2020-01-24 DIAGNOSIS — E039 Hypothyroidism, unspecified: Secondary | ICD-10-CM

## 2020-01-24 MED ORDER — LEVOTHYROXINE SODIUM 112 MCG PO TABS: 112.0000 ug | ORAL_TABLET | Freq: Every morning | ORAL | 2 refills | Status: DC

## 2020-01-24 NOTE — Progress Notes (Signed)
Meds ordered this encounter  Medications  . levothyroxine (SYNTHROID) 112 MCG tablet    Sig: Take 1 tablet (112 mcg total) by mouth every morning.    Dispense:  30 tablet    Refill:  2    Order Specific Question:   Supervising Provider    Answer:   Tresa Garter [[id number]]    [name]  APRN, MSN, FNP-C Patient [facility] [address] [place], [place] [address] [phone]

## 2020-01-26 ENCOUNTER — Telehealth: Payer: Self-pay

## 2020-01-26 NOTE — Telephone Encounter (Signed)
Pt aware of results and voiced understanding, all questions answered/concerns addressed.  

## 2020-01-26 NOTE — Telephone Encounter (Signed)
-----   Message from Dorena Dew, [place] sent at 01/24/2020  4:55 PM EST ----- Regarding: lab results Please inform patient that TSH continues to be mildly elevated.  Levothyroxine increased to 112 mcg daily 30 minutes prior to eating.  Patient will need to schedule lab appointment in 4 weeks to repeat TSH.  [name]  APRN, MSN, FNP-C Patient [facility] [address] [place], [place] [address] [phone]

## 2020-01-31 DIAGNOSIS — J3089 Other allergic rhinitis: Secondary | ICD-10-CM | POA: Diagnosis not present

## 2020-01-31 DIAGNOSIS — J3081 Allergic rhinitis due to animal (cat) (dog) hair and dander: Secondary | ICD-10-CM | POA: Diagnosis not present

## 2020-01-31 DIAGNOSIS — J301 Allergic rhinitis due to pollen: Secondary | ICD-10-CM | POA: Diagnosis not present

## 2020-02-10 DIAGNOSIS — J301 Allergic rhinitis due to pollen: Secondary | ICD-10-CM | POA: Diagnosis not present

## 2020-02-10 DIAGNOSIS — J3081 Allergic rhinitis due to animal (cat) (dog) hair and dander: Secondary | ICD-10-CM | POA: Diagnosis not present

## 2020-02-10 DIAGNOSIS — J3089 Other allergic rhinitis: Secondary | ICD-10-CM | POA: Diagnosis not present

## 2020-02-14 ENCOUNTER — Ambulatory Visit: Payer: Federal, State, Local not specified - PPO | Admitting: Family Medicine

## 2020-02-14 DIAGNOSIS — J3089 Other allergic rhinitis: Secondary | ICD-10-CM | POA: Diagnosis not present

## 2020-02-14 DIAGNOSIS — J301 Allergic rhinitis due to pollen: Secondary | ICD-10-CM | POA: Diagnosis not present

## 2020-02-14 DIAGNOSIS — J3081 Allergic rhinitis due to animal (cat) (dog) hair and dander: Secondary | ICD-10-CM | POA: Diagnosis not present

## 2020-02-22 DIAGNOSIS — J301 Allergic rhinitis due to pollen: Secondary | ICD-10-CM | POA: Diagnosis not present

## 2020-02-22 DIAGNOSIS — J3089 Other allergic rhinitis: Secondary | ICD-10-CM | POA: Diagnosis not present

## 2020-02-22 DIAGNOSIS — J3081 Allergic rhinitis due to animal (cat) (dog) hair and dander: Secondary | ICD-10-CM | POA: Diagnosis not present

## 2020-02-23 DIAGNOSIS — J3081 Allergic rhinitis due to animal (cat) (dog) hair and dander: Secondary | ICD-10-CM | POA: Diagnosis not present

## 2020-02-23 DIAGNOSIS — J3089 Other allergic rhinitis: Secondary | ICD-10-CM | POA: Diagnosis not present

## 2020-02-23 DIAGNOSIS — J301 Allergic rhinitis due to pollen: Secondary | ICD-10-CM | POA: Diagnosis not present

## 2020-02-27 ENCOUNTER — Other Ambulatory Visit: Payer: Federal, State, Local not specified - PPO

## 2020-02-28 ENCOUNTER — Other Ambulatory Visit: Payer: Self-pay

## 2020-02-28 ENCOUNTER — Other Ambulatory Visit: Payer: Self-pay | Admitting: Family Medicine

## 2020-02-28 ENCOUNTER — Other Ambulatory Visit: Payer: Federal, State, Local not specified - PPO

## 2020-02-28 DIAGNOSIS — J3089 Other allergic rhinitis: Secondary | ICD-10-CM | POA: Diagnosis not present

## 2020-02-28 DIAGNOSIS — E039 Hypothyroidism, unspecified: Secondary | ICD-10-CM

## 2020-02-28 DIAGNOSIS — J301 Allergic rhinitis due to pollen: Secondary | ICD-10-CM | POA: Diagnosis not present

## 2020-02-28 DIAGNOSIS — J3081 Allergic rhinitis due to animal (cat) (dog) hair and dander: Secondary | ICD-10-CM | POA: Diagnosis not present

## 2020-02-28 NOTE — Progress Notes (Signed)
Orders Placed This Encounter  Procedures  . Thyroid Panel With TSH    Standing Status:   Future    Standing Expiration Date:   02/27/2021    [name]  APRN, MSN, FNP-C Patient [facility] [address] [place], [place] [address] [phone]

## 2020-02-29 LAB — THYROID PANEL WITH TSH
Free Thyroxine Index: 1.9 (ref 1.2–4.9)
T3 Uptake Ratio: 37 % (ref 24–39)
T4, Total: 5.2 ug/dL (ref 4.5–12.0)
TSH: 1.22 u[IU]/mL (ref 0.450–4.500)

## 2020-03-02 ENCOUNTER — Telehealth: Payer: Self-pay

## 2020-03-02 NOTE — Telephone Encounter (Signed)
-----   Message from Dorena Dew, [place] sent at 02/29/2020  3:46 PM EST ----- Regarding: lab results Please inform patient that thyroid studies are within a normal range. Continue medication at current dosage. Please make sure that patient has a 3 month follow up scheduled.    [name]  APRN, MSN, FNP-C Patient [facility] [address] [place], [place] [address] [phone]

## 2020-03-02 NOTE — Telephone Encounter (Signed)
Called and spoke w/ pt gave him the results of his lab results and followup in 3 month with any appt with [place]

## 2020-03-07 DIAGNOSIS — J3081 Allergic rhinitis due to animal (cat) (dog) hair and dander: Secondary | ICD-10-CM | POA: Diagnosis not present

## 2020-03-07 DIAGNOSIS — J3089 Other allergic rhinitis: Secondary | ICD-10-CM | POA: Diagnosis not present

## 2020-03-07 DIAGNOSIS — J301 Allergic rhinitis due to pollen: Secondary | ICD-10-CM | POA: Diagnosis not present

## 2020-03-21 DIAGNOSIS — J3081 Allergic rhinitis due to animal (cat) (dog) hair and dander: Secondary | ICD-10-CM | POA: Diagnosis not present

## 2020-03-21 DIAGNOSIS — J3089 Other allergic rhinitis: Secondary | ICD-10-CM | POA: Diagnosis not present

## 2020-03-21 DIAGNOSIS — J301 Allergic rhinitis due to pollen: Secondary | ICD-10-CM | POA: Diagnosis not present

## 2020-03-26 DIAGNOSIS — J301 Allergic rhinitis due to pollen: Secondary | ICD-10-CM | POA: Diagnosis not present

## 2020-03-26 DIAGNOSIS — J3089 Other allergic rhinitis: Secondary | ICD-10-CM | POA: Diagnosis not present

## 2020-03-26 DIAGNOSIS — J3081 Allergic rhinitis due to animal (cat) (dog) hair and dander: Secondary | ICD-10-CM | POA: Diagnosis not present

## 2020-04-10 ENCOUNTER — Other Ambulatory Visit: Payer: Self-pay | Admitting: Family Medicine

## 2020-04-10 DIAGNOSIS — E039 Hypothyroidism, unspecified: Secondary | ICD-10-CM

## 2020-04-11 DIAGNOSIS — J3081 Allergic rhinitis due to animal (cat) (dog) hair and dander: Secondary | ICD-10-CM | POA: Diagnosis not present

## 2020-04-11 DIAGNOSIS — J301 Allergic rhinitis due to pollen: Secondary | ICD-10-CM | POA: Diagnosis not present

## 2020-04-11 DIAGNOSIS — J3089 Other allergic rhinitis: Secondary | ICD-10-CM | POA: Diagnosis not present

## 2020-04-18 DIAGNOSIS — J301 Allergic rhinitis due to pollen: Secondary | ICD-10-CM | POA: Diagnosis not present

## 2020-04-18 DIAGNOSIS — J3089 Other allergic rhinitis: Secondary | ICD-10-CM | POA: Diagnosis not present

## 2020-04-18 DIAGNOSIS — J3081 Allergic rhinitis due to animal (cat) (dog) hair and dander: Secondary | ICD-10-CM | POA: Diagnosis not present

## 2020-04-27 DIAGNOSIS — J3081 Allergic rhinitis due to animal (cat) (dog) hair and dander: Secondary | ICD-10-CM | POA: Diagnosis not present

## 2020-04-27 DIAGNOSIS — J3089 Other allergic rhinitis: Secondary | ICD-10-CM | POA: Diagnosis not present

## 2020-04-27 DIAGNOSIS — J301 Allergic rhinitis due to pollen: Secondary | ICD-10-CM | POA: Diagnosis not present

## 2020-05-09 DIAGNOSIS — J3089 Other allergic rhinitis: Secondary | ICD-10-CM | POA: Diagnosis not present

## 2020-05-09 DIAGNOSIS — J301 Allergic rhinitis due to pollen: Secondary | ICD-10-CM | POA: Diagnosis not present

## 2020-05-09 DIAGNOSIS — J3081 Allergic rhinitis due to animal (cat) (dog) hair and dander: Secondary | ICD-10-CM | POA: Diagnosis not present

## 2020-05-16 DIAGNOSIS — J301 Allergic rhinitis due to pollen: Secondary | ICD-10-CM | POA: Diagnosis not present

## 2020-05-16 DIAGNOSIS — J3081 Allergic rhinitis due to animal (cat) (dog) hair and dander: Secondary | ICD-10-CM | POA: Diagnosis not present

## 2020-05-16 DIAGNOSIS — J3089 Other allergic rhinitis: Secondary | ICD-10-CM | POA: Diagnosis not present

## 2020-05-22 DIAGNOSIS — J3081 Allergic rhinitis due to animal (cat) (dog) hair and dander: Secondary | ICD-10-CM | POA: Diagnosis not present

## 2020-05-22 DIAGNOSIS — J3089 Other allergic rhinitis: Secondary | ICD-10-CM | POA: Diagnosis not present

## 2020-05-22 DIAGNOSIS — J301 Allergic rhinitis due to pollen: Secondary | ICD-10-CM | POA: Diagnosis not present

## 2020-05-29 ENCOUNTER — Other Ambulatory Visit: Payer: Self-pay

## 2020-05-29 ENCOUNTER — Encounter: Payer: Self-pay | Admitting: Family Medicine

## 2020-05-29 ENCOUNTER — Ambulatory Visit: Payer: Federal, State, Local not specified - PPO | Admitting: Family Medicine

## 2020-05-29 VITALS — BP 136/84 | HR 78 | Ht 70.0 in | Wt 181.0 lb

## 2020-05-29 DIAGNOSIS — H1045 Other chronic allergic conjunctivitis: Secondary | ICD-10-CM | POA: Insufficient documentation

## 2020-05-29 DIAGNOSIS — J301 Allergic rhinitis due to pollen: Secondary | ICD-10-CM | POA: Insufficient documentation

## 2020-05-29 DIAGNOSIS — J4520 Mild intermittent asthma, uncomplicated: Secondary | ICD-10-CM | POA: Insufficient documentation

## 2020-05-29 DIAGNOSIS — E039 Hypothyroidism, unspecified: Secondary | ICD-10-CM | POA: Diagnosis not present

## 2020-05-29 DIAGNOSIS — J3081 Allergic rhinitis due to animal (cat) (dog) hair and dander: Secondary | ICD-10-CM | POA: Diagnosis not present

## 2020-05-29 DIAGNOSIS — Z13220 Encounter for screening for lipoid disorders: Secondary | ICD-10-CM

## 2020-05-29 DIAGNOSIS — J3089 Other allergic rhinitis: Secondary | ICD-10-CM | POA: Diagnosis not present

## 2020-05-29 NOTE — Progress Notes (Signed)
Patient [name]      Subjective:  Patient ID: Don Hurst, male    DOB: 28-May-1960  Age: 60 y.o. MRN: [id number]  CC: Follow up of chronic conditions  HPI Don Hurst is a very pleasant 60 year old male with a medical history significant for hypothyroidism and mild intermittent asthma presents for follow-up of chronic conditions.  Patient says that he has been doing well and is without complaint on today.  He has not resumed exercising since having COVID-19 infection.  He says that fatigue has resolved, yet he has not started exercise routine.  He denies any headache, chest pain, dizziness, increased sweating, intolerance to heat or cold, diarrhea, constipation, weight loss, or weight gain.  Thyroid Problem Presents for follow-up visit. Patient reports no anxiety, cold intolerance, diaphoresis, diarrhea, dry skin, fatigue, hair loss, hoarse voice, nail problem, visual change, weight gain or weight loss. The symptoms have been stable.     Past Medical History:  Diagnosis Date  . Allergy   . Arthritis   . Asthma   . GERD (gastroesophageal reflux disease)   . History of chronic bronchitis   . Multiple allergies   . Wears glasses     Past Surgical History:  Procedure Laterality Date  . COLONOSCOPY    . INGUINAL HERNIA REPAIR  02/2004   rt  . INGUINAL HERNIA REPAIR Left 02/06/2016   Procedure: LAPAROSCOPIC LEFT INGUINAL HERNIA REPAIR;  Surgeon: Clovis Riley, MD;  Location: [facility];  Service: General;  Laterality: Left;  . INSERTION OF MESH Left 02/06/2016   Procedure: INSERTION OF MESH;  Surgeon: Clovis Riley, MD;  Location: [facility];  Service: General;  Laterality: Left;  . PAROTIDECTOMY Left 02/08/2014   Procedure: LEFT SUPERFICIAL PAROTIDECTOMY;  Surgeon: Jodi Marble, MD;  Location: [facility];  Service: ENT;  Laterality: Left;    Family History  Problem Relation Age of Onset  . Hypertension Mother         per pt dx around 79 y o  . Cancer Father 21       colon  . Eczema Daughter   . Hypertension Sister     Social History   Socioeconomic History  . Marital status: Married    Spouse name: Not on file  . Number of children: Not on file  . Years of education: Not on file  . Highest education level: Not on file  Occupational History  . Not on file  Tobacco Use  . Smoking status: Former Smoker    Packs/day: 0.25    Years: 3.00    Pack years: 0.75    Types: Cigarettes    Quit date: 03/26/1991    Years since quitting: 29.1  . Smokeless tobacco: Never Used  . Tobacco comment: smoked 1 pk every 2 wks  Vaping Use  . Vaping Use: Never used  Substance and Sexual Activity  . Alcohol use: No  . Drug use: No  . Sexual activity: Yes    Comment: number of sex partners in the last 12 months  1  Other Topics Concern  . Not on file  Social History Narrative   Exercise doing weights 4 times per week for 30 minutes   Social Determinants of Health   Financial Resource Strain: Not on file  Food Insecurity: Not on file  Transportation Needs: Not on file  Physical Activity: Not on file  Stress: Not on file  Social Connections: Not on file  Intimate Partner Violence: Not on file    Outpatient Medications Prior to Visit  Medication Sig Dispense Refill  . azelastine (ASTELIN) 0.1 % nasal spray Place 1 spray into both nostrils 2 (two) times daily. Use in each nostril as directed 30 mL 5  . budesonide-formoterol (SYMBICORT) 80-4.5 MCG/ACT inhaler Inhale 2 puffs into the lungs 2 (two) times daily. 10.2 g 2  . Cholecalciferol (VITAMIN D PO) Take 1 tablet by mouth daily.     . Cyanocobalamin (VITAMIN B 12 PO) Take by mouth.    . levothyroxine (SYNTHROID) 112 MCG tablet TAKE 1 TABLET(112 MCG) BY MOUTH EVERY MORNING 90 tablet 2  . montelukast (SINGULAIR) 10 MG tablet Take 1 tablet (10 mg total) by mouth at bedtime. 90 tablet 3  . Omega-3 Fatty Acids (FISH OIL) 1000 MG CPDR Take by mouth.    Marland Kitchen  omeprazole (PRILOSEC) 20 MG capsule Take 20 mg by mouth daily as needed (heartburn).     Marland Kitchen PRESCRIPTION MEDICATION Allergy shots weekly    . PROAIR HFA 108 (90 Base) MCG/ACT inhaler 1-2 puffs every 4-6 hours as needed for shortness of breath or wheezing 8.5 g 0  . triamcinolone ointment (KENALOG) 0.5 % Apply 1 application topically 2 (two) times daily. 15 g 0  . vitamin C (ASCORBIC ACID) 500 MG tablet Take 500 mg by mouth daily.    Marland Kitchen VITAMIN E PO Take 1 tablet by mouth daily.      No facility-administered medications prior to visit.    Allergies  Allergen Reactions  . Lactose Intolerance (Gi)     ROS Review of Systems  Constitutional: Negative for diaphoresis, fatigue, weight gain and weight loss.  HENT: Negative.  Negative for hoarse voice.   Respiratory: Negative.   Cardiovascular: Negative.   Gastrointestinal: Negative.  Negative for diarrhea.  Endocrine: Negative.  Negative for cold intolerance.  Genitourinary: Negative.   Hematological: Negative.   Psychiatric/Behavioral: Negative.  The patient is not nervous/anxious.       Objective:    Physical Exam Constitutional:      Appearance: Normal appearance.  HENT:     Head: Normocephalic.  Eyes:     Pupils: Pupils are equal, round, and reactive to light.  Cardiovascular:     Rate and Rhythm: Normal rate and regular rhythm.     Pulses: Normal pulses.  Abdominal:     General: Bowel sounds are normal.     Palpations: Abdomen is soft.  Neurological:     General: No focal deficit present.     Mental Status: He is alert. Mental status is at baseline.  Psychiatric:        Mood and Affect: Mood normal.        Behavior: Behavior normal.        Thought Content: Thought content normal.        Judgment: Judgment normal.     BP 136/84   Pulse 78   Ht 5\' 10"  (1.778 m)   Wt 181 lb (82.1 kg)   SpO2 100%   BMI 25.97 kg/m  Wt Readings from Last 3 Encounters:  05/29/20 181 lb (82.1 kg)  10/18/19 170 lb 9.6 oz (77.4 kg)   07/12/19 173 lb (78.5 kg)     There are no preventive care reminders to display for this patient.  There are no preventive care reminders to display for this patient.  Lab Results  Component Value Date   TSH 1.220 02/28/2020   Lab Results  Component Value Date  WBC 4.7 07/12/2019   HGB 14.3 07/12/2019   HCT 43.9 07/12/2019   MCV 94 07/12/2019   PLT 226 07/12/2019   Lab Results  Component Value Date   NA 139 07/12/2019   K 4.1 07/12/2019   CO2 21 07/12/2019   GLUCOSE 90 07/12/2019   BUN 11 07/12/2019   CREATININE 1.04 07/12/2019   BILITOT 0.3 11/23/2017   ALKPHOS 91 11/23/2017   AST 24 11/23/2017   ALT 26 11/23/2017   PROT 7.4 11/23/2017   ALBUMIN 4.3 11/23/2017   CALCIUM 9.4 07/12/2019   Lab Results  Component Value Date   CHOL 218 (H) 06/26/2011   Lab Results  Component Value Date   HDL 44 06/26/2011   Lab Results  Component Value Date   LDLCALC 158 (H) 06/26/2011   Lab Results  Component Value Date   TRIG 80 06/26/2011   Lab Results  Component Value Date   CHOLHDL 5.0 06/26/2011   Lab Results  Component Value Date   HGBA1C 5.6 04/21/2016      Assessment & Plan:   Problem List Items Addressed This Visit   None   Visit Diagnoses    Screening cholesterol level    -  Primary   Relevant Orders   Lipid Panel (Completed)   Acquired hypothyroidism       Relevant Orders   Thyroid Panel With TSH (Completed)     Screening cholesterol level The 10-year ASCVD risk score Mikey Bussing DC Jr., et al., 2013) is: 8.2%   Values used to calculate the score:     Age: 59 years     Sex: Male     Is Non-Hispanic African American: Yes     Diabetic: No     Tobacco smoker: No     Systolic Blood Pressure: 161 mmHg     Is BP treated: No     HDL Cholesterol: 53 mg/dL     Total Cholesterol: 223 mg/dL  - Lipid Panel  Acquired hypothyroidism  - Thyroid Panel With TSH  Follow-up: Return in about 6 months (around 11/28/2020) for hypothyroid.    [name]  APRN, MSN, FNP-C Patient [facility] [address] [place], [place] [address] [phone]

## 2020-05-29 NOTE — Patient Instructions (Signed)
Hypothyroidism  Hypothyroidism is when the thyroid gland does not make enough of certain hormones (it is underactive). The thyroid gland is a small gland located in the lower front part of the neck, just in front of the windpipe (trachea). This gland makes hormones that help control how the body uses food for energy (metabolism) as well as how the heart and brain function. These hormones also play a role in keeping your bones strong. When the thyroid is underactive, it produces too little of the hormones thyroxine (T4) and triiodothyronine (T3). What are the causes? This condition may be caused by:  Hashimoto's disease. This is a disease in which the body's disease-fighting system (immune system) attacks the thyroid gland. This is the most common cause.  Viral infections.  Pregnancy.  Certain medicines.  Birth defects.  Past radiation treatments to the head or neck for cancer.  Past treatment with radioactive iodine.  Past exposure to radiation in the environment.  Past surgical removal of part or all of the thyroid.  Problems with a gland in the center of the brain (pituitary gland).  Lack of enough iodine in the diet. What increases the risk? You are more likely to develop this condition if:  You are male.  You have a family history of thyroid conditions.  You use a medicine called lithium.  You take medicines that affect the immune system (immunosuppressants). What are the signs or symptoms? Symptoms of this condition include:  Feeling as though you have no energy (lethargy).  Not being able to tolerate cold.  Weight gain that is not explained by a change in diet or exercise habits.  Lack of appetite.  Dry skin.  Coarse hair.  Menstrual irregularity.  Slowing of thought processes.  Constipation.  Sadness or depression. How is this diagnosed? This condition may be diagnosed based on:  Your symptoms, your medical history, and a physical exam.  Blood  tests. You may also have imaging tests, such as an ultrasound or MRI. How is this treated? This condition is treated with medicine that replaces the thyroid hormones that your body does not make. After you begin treatment, it may take several weeks for symptoms to go away. Follow these instructions at home:  Take over-the-counter and prescription medicines only as told by your health care provider.  If you start taking any new medicines, tell your health care provider.  Keep all follow-up visits as told by your health care provider. This is important. ? As your condition improves, your dosage of thyroid hormone medicine may change. ? You will need to have blood tests regularly so that your health care provider can monitor your condition. Contact a health care provider if:  Your symptoms do not get better with treatment.  You are taking thyroid hormone replacement medicine and you: ? Sweat a lot. ? Have tremors. ? Feel anxious. ? Lose weight rapidly. ? Cannot tolerate heat. ? Have emotional swings. ? Have diarrhea. ? Feel weak. Get help right away if you have:  Chest pain.  An irregular heartbeat.  A rapid heartbeat.  Difficulty breathing. Summary  Hypothyroidism is when the thyroid gland does not make enough of certain hormones (it is underactive).  When the thyroid is underactive, it produces too little of the hormones thyroxine (T4) and triiodothyronine (T3).  The most common cause is Hashimoto's disease, a disease in which the body's disease-fighting system (immune system) attacks the thyroid gland. The condition can also be caused by viral infections, medicine, pregnancy, or   past radiation treatment to the head or neck.  Symptoms may include weight gain, dry skin, constipation, feeling as though you do not have energy, and not being able to tolerate cold.  This condition is treated with medicine to replace the thyroid hormones that your body does not make. This  information is not intended to replace advice given to you by your health care provider. Make sure you discuss any questions you have with your health care provider. Document Revised: 11/11/2019 Document Reviewed: 10/27/2019 Elsevier Patient Education  2021 [place]. Preventing High Cholesterol Cholesterol is a white, waxy substance similar to fat that the human body needs to help build cells. The liver makes all the cholesterol that a person's body needs. Having high cholesterol (hypercholesterolemia) increases your risk for heart disease and stroke. Extra or excess cholesterol comes from the food that you eat. High cholesterol can often be prevented with diet and lifestyle changes. If you already have high cholesterol, you can control it with diet, lifestyle changes, and medicines. How can high cholesterol affect me? If you have high cholesterol, fatty deposits (plaques) may build up on the walls of your blood vessels. The blood vessels that carry blood away from your heart are called arteries. Plaques make the arteries narrower and stiffer. This in turn can:  Restrict or block blood flow and cause blood clots to form.  Increase your risk for heart attack and stroke. What can increase my risk for high cholesterol? This condition is more likely to develop in people who:  Eat foods that are high in saturated fat or cholesterol. Saturated fat is mostly found in foods that come from animal sources.  Are overweight.  Are not getting enough exercise.  Have a family history of high cholesterol (familial hypercholesterolemia). What actions can I take to prevent this? Nutrition  Eat less saturated fat.  Avoid trans fats (partially hydrogenated oils). These are often found in margarine and in some baked goods, fried foods, and snacks bought in packages.  Avoid precooked or cured meat, such as bacon, sausages, or meat loaves.  Avoid foods and drinks that have added sugars.  Eat more  fruits, vegetables, and whole grains.  Choose healthy sources of protein, such as fish, poultry, lean cuts of red meat, beans, peas, lentils, and nuts.  Choose healthy sources of fat, such as: ? Nuts. ? Vegetable oils, especially olive oil. ? Fish that have healthy fats, such as omega-3 fatty acids. These fish include mackerel or salmon.   Lifestyle  Lose weight if you are overweight. Maintaining a healthy body mass index (BMI) can help prevent or control high cholesterol. It can also lower your risk for diabetes and high blood pressure. Ask your health care provider to help you with a diet and exercise plan to lose weight safely.  Do not use any products that contain nicotine or tobacco, such as cigarettes, e-cigarettes, and chewing tobacco. If you need help quitting, ask your health care provider. Alcohol use  Do not drink alcohol if: ? Your health care provider tells you not to drink. ? You are pregnant, may be pregnant, or are planning to become pregnant.  If you drink alcohol: ? Limit how much you use to:  0-1 drink a day for women.  0-2 drinks a day for men. ? Be aware of how much alcohol is in your drink. In the [place] one drink equals one 12 oz bottle of beer (355 mL), one 5 oz glass of wine (148 mL), or  one 1 oz glass of hard liquor (44 mL). Activity  Get enough exercise. Do exercises as told by your health care provider.  Each week, do at least 150 minutes of exercise that takes a medium level of effort (moderate-intensity exercise). This kind of exercise: ? Makes your heart beat faster while allowing you to still be able to talk. ? Can be done in short sessions several times a day or longer sessions a few times a week. For example, on 5 days each week, you could walk fast or ride your bike 3 times a day for 10 minutes each time.   Medicines  Your health care provider may recommend medicines to help lower cholesterol. This may be a medicine to lower the amount of  cholesterol that your liver makes. You may need medicine if: ? Diet and lifestyle changes have not lowered your cholesterol enough. ? You have high cholesterol and other risk factors for heart disease or stroke.  Take over-the-counter and prescription medicines only as told by your health care provider. General information  Manage your risk factors for high cholesterol. Talk with your health care provider about all your risk factors and how to lower your risk.  Manage other conditions that you have, such as diabetes or high blood pressure (hypertension).  Have blood tests to check your cholesterol levels at regular points in time as told by your health care provider.  Keep all follow-up visits as told by your health care provider. This is important. Where to find more information  American Heart Association: www.heart.org  National Heart, Lung, and Blood Institute: https://wilson-eaton.com/ Summary  High cholesterol increases your risk for heart disease and stroke. By keeping your cholesterol level low, you can reduce your risk for these conditions.  High cholesterol can often be prevented with diet and lifestyle changes.  Work with your health care provider to manage your risk factors, and have your blood tested regularly. This information is not intended to replace advice given to you by your health care provider. Make sure you discuss any questions you have with your health care provider. Document Revised: 11/23/2018 Document Reviewed: 11/23/2018 Elsevier Patient Education  [place].

## 2020-05-30 LAB — LIPID PANEL
Chol/HDL Ratio: 4.2 ratio (ref 0.0–5.0)
Cholesterol, Total: 223 mg/dL — ABNORMAL HIGH (ref 100–199)
HDL: 53 mg/dL (ref >39)
LDL Chol Calc (NIH): 153 mg/dL — ABNORMAL HIGH (ref 0–99)
Triglycerides: 97 mg/dL (ref 0–149)
VLDL Cholesterol Cal: 17 mg/dL (ref 5–40)

## 2020-05-30 LAB — THYROID PANEL WITH TSH
Free Thyroxine Index: 1.7 (ref 1.2–4.9)
T3 Uptake Ratio: 32 % (ref 24–39)
T4, Total: 5.2 ug/dL (ref 4.5–12.0)
TSH: 1.33 u[IU]/mL (ref 0.450–4.500)

## 2020-06-04 ENCOUNTER — Telehealth: Payer: Self-pay | Admitting: Family Medicine

## 2020-06-04 NOTE — Telephone Encounter (Signed)
Mr. Don Hurst is a 60 year old male that was evaluated in clinic on 05/29/2020 for chronic conditions. Reviewed thyroid panel, within normal limits.  No changes to medication regimen warranted at this time. Also, patient had cholesterol panel.  Total cholesterol was elevated at 223, goal is less than 200.  LDL ("bad cholesterol") was elevated at 157, goal is less than 100.  Recommend a low-fat, low carbohydrate diet divided over small meals throughout the day.  Recommend over-the-counter fish oil tablets daily prior to dinner.  Recommend low impact cardiovascular exercise 3 times a week, the goal is 5 times a week, start slowly.  Also, recommend increasing water intake to about 32 ounces per day.  We will repeat cholesterol panel in 6 months.  [name]  APRN, MSN, FNP-C Patient [facility] [address] [place], [place] [address] [phone]

## 2020-06-05 NOTE — Telephone Encounter (Signed)
Mychart message sent to patient with results and recommendations.

## 2020-06-14 DIAGNOSIS — J301 Allergic rhinitis due to pollen: Secondary | ICD-10-CM | POA: Diagnosis not present

## 2020-06-14 DIAGNOSIS — J3089 Other allergic rhinitis: Secondary | ICD-10-CM | POA: Diagnosis not present

## 2020-06-14 DIAGNOSIS — J3081 Allergic rhinitis due to animal (cat) (dog) hair and dander: Secondary | ICD-10-CM | POA: Diagnosis not present

## 2020-06-18 DIAGNOSIS — J301 Allergic rhinitis due to pollen: Secondary | ICD-10-CM | POA: Diagnosis not present

## 2020-06-18 DIAGNOSIS — J3089 Other allergic rhinitis: Secondary | ICD-10-CM | POA: Diagnosis not present

## 2020-06-18 DIAGNOSIS — J3081 Allergic rhinitis due to animal (cat) (dog) hair and dander: Secondary | ICD-10-CM | POA: Diagnosis not present

## 2020-06-27 DIAGNOSIS — J3089 Other allergic rhinitis: Secondary | ICD-10-CM | POA: Diagnosis not present

## 2020-06-27 DIAGNOSIS — J301 Allergic rhinitis due to pollen: Secondary | ICD-10-CM | POA: Diagnosis not present

## 2020-06-27 DIAGNOSIS — J3081 Allergic rhinitis due to animal (cat) (dog) hair and dander: Secondary | ICD-10-CM | POA: Diagnosis not present

## 2020-07-04 DIAGNOSIS — J3081 Allergic rhinitis due to animal (cat) (dog) hair and dander: Secondary | ICD-10-CM | POA: Diagnosis not present

## 2020-07-04 DIAGNOSIS — J3089 Other allergic rhinitis: Secondary | ICD-10-CM | POA: Diagnosis not present

## 2020-07-04 DIAGNOSIS — J301 Allergic rhinitis due to pollen: Secondary | ICD-10-CM | POA: Diagnosis not present

## 2020-07-09 DIAGNOSIS — J3081 Allergic rhinitis due to animal (cat) (dog) hair and dander: Secondary | ICD-10-CM | POA: Diagnosis not present

## 2020-07-09 DIAGNOSIS — J3089 Other allergic rhinitis: Secondary | ICD-10-CM | POA: Diagnosis not present

## 2020-07-09 DIAGNOSIS — J301 Allergic rhinitis due to pollen: Secondary | ICD-10-CM | POA: Diagnosis not present

## 2020-07-17 DIAGNOSIS — J3089 Other allergic rhinitis: Secondary | ICD-10-CM | POA: Diagnosis not present

## 2020-07-17 DIAGNOSIS — J3081 Allergic rhinitis due to animal (cat) (dog) hair and dander: Secondary | ICD-10-CM | POA: Diagnosis not present

## 2020-07-17 DIAGNOSIS — J301 Allergic rhinitis due to pollen: Secondary | ICD-10-CM | POA: Diagnosis not present

## 2020-08-01 DIAGNOSIS — J3089 Other allergic rhinitis: Secondary | ICD-10-CM | POA: Diagnosis not present

## 2020-08-01 DIAGNOSIS — J3081 Allergic rhinitis due to animal (cat) (dog) hair and dander: Secondary | ICD-10-CM | POA: Diagnosis not present

## 2020-08-01 DIAGNOSIS — J301 Allergic rhinitis due to pollen: Secondary | ICD-10-CM | POA: Diagnosis not present

## 2020-08-08 DIAGNOSIS — J3081 Allergic rhinitis due to animal (cat) (dog) hair and dander: Secondary | ICD-10-CM | POA: Diagnosis not present

## 2020-08-08 DIAGNOSIS — J3089 Other allergic rhinitis: Secondary | ICD-10-CM | POA: Diagnosis not present

## 2020-08-08 DIAGNOSIS — J301 Allergic rhinitis due to pollen: Secondary | ICD-10-CM | POA: Diagnosis not present

## 2020-08-16 DIAGNOSIS — J3089 Other allergic rhinitis: Secondary | ICD-10-CM | POA: Diagnosis not present

## 2020-08-16 DIAGNOSIS — J3081 Allergic rhinitis due to animal (cat) (dog) hair and dander: Secondary | ICD-10-CM | POA: Diagnosis not present

## 2020-08-16 DIAGNOSIS — J301 Allergic rhinitis due to pollen: Secondary | ICD-10-CM | POA: Diagnosis not present

## 2020-08-20 DIAGNOSIS — J301 Allergic rhinitis due to pollen: Secondary | ICD-10-CM | POA: Diagnosis not present

## 2020-08-20 DIAGNOSIS — J3089 Other allergic rhinitis: Secondary | ICD-10-CM | POA: Diagnosis not present

## 2020-08-20 DIAGNOSIS — J3081 Allergic rhinitis due to animal (cat) (dog) hair and dander: Secondary | ICD-10-CM | POA: Diagnosis not present

## 2020-08-28 DIAGNOSIS — J3089 Other allergic rhinitis: Secondary | ICD-10-CM | POA: Diagnosis not present

## 2020-08-28 DIAGNOSIS — J301 Allergic rhinitis due to pollen: Secondary | ICD-10-CM | POA: Diagnosis not present

## 2020-08-28 DIAGNOSIS — J3081 Allergic rhinitis due to animal (cat) (dog) hair and dander: Secondary | ICD-10-CM | POA: Diagnosis not present

## 2020-09-04 DIAGNOSIS — J301 Allergic rhinitis due to pollen: Secondary | ICD-10-CM | POA: Diagnosis not present

## 2020-09-04 DIAGNOSIS — J3089 Other allergic rhinitis: Secondary | ICD-10-CM | POA: Diagnosis not present

## 2020-09-04 DIAGNOSIS — J3081 Allergic rhinitis due to animal (cat) (dog) hair and dander: Secondary | ICD-10-CM | POA: Diagnosis not present

## 2020-09-10 DIAGNOSIS — J3081 Allergic rhinitis due to animal (cat) (dog) hair and dander: Secondary | ICD-10-CM | POA: Diagnosis not present

## 2020-09-10 DIAGNOSIS — J3089 Other allergic rhinitis: Secondary | ICD-10-CM | POA: Diagnosis not present

## 2020-09-10 DIAGNOSIS — J301 Allergic rhinitis due to pollen: Secondary | ICD-10-CM | POA: Diagnosis not present

## 2020-09-14 DIAGNOSIS — J301 Allergic rhinitis due to pollen: Secondary | ICD-10-CM | POA: Diagnosis not present

## 2020-09-20 DIAGNOSIS — J3089 Other allergic rhinitis: Secondary | ICD-10-CM | POA: Diagnosis not present

## 2020-09-20 DIAGNOSIS — J301 Allergic rhinitis due to pollen: Secondary | ICD-10-CM | POA: Diagnosis not present

## 2020-09-20 DIAGNOSIS — J3081 Allergic rhinitis due to animal (cat) (dog) hair and dander: Secondary | ICD-10-CM | POA: Diagnosis not present

## 2020-09-24 DIAGNOSIS — J3089 Other allergic rhinitis: Secondary | ICD-10-CM | POA: Diagnosis not present

## 2020-09-24 DIAGNOSIS — J301 Allergic rhinitis due to pollen: Secondary | ICD-10-CM | POA: Diagnosis not present

## 2020-09-24 DIAGNOSIS — J3081 Allergic rhinitis due to animal (cat) (dog) hair and dander: Secondary | ICD-10-CM | POA: Diagnosis not present

## 2020-10-08 DIAGNOSIS — J301 Allergic rhinitis due to pollen: Secondary | ICD-10-CM | POA: Diagnosis not present

## 2020-10-08 DIAGNOSIS — J3081 Allergic rhinitis due to animal (cat) (dog) hair and dander: Secondary | ICD-10-CM | POA: Diagnosis not present

## 2020-10-08 DIAGNOSIS — J3089 Other allergic rhinitis: Secondary | ICD-10-CM | POA: Diagnosis not present

## 2020-10-16 DIAGNOSIS — J301 Allergic rhinitis due to pollen: Secondary | ICD-10-CM | POA: Diagnosis not present

## 2020-10-16 DIAGNOSIS — J3081 Allergic rhinitis due to animal (cat) (dog) hair and dander: Secondary | ICD-10-CM | POA: Diagnosis not present

## 2020-10-16 DIAGNOSIS — J3089 Other allergic rhinitis: Secondary | ICD-10-CM | POA: Diagnosis not present

## 2020-11-01 DIAGNOSIS — J301 Allergic rhinitis due to pollen: Secondary | ICD-10-CM | POA: Diagnosis not present

## 2020-11-01 DIAGNOSIS — J3081 Allergic rhinitis due to animal (cat) (dog) hair and dander: Secondary | ICD-10-CM | POA: Diagnosis not present

## 2020-11-01 DIAGNOSIS — J3089 Other allergic rhinitis: Secondary | ICD-10-CM | POA: Diagnosis not present

## 2020-11-08 DIAGNOSIS — J3081 Allergic rhinitis due to animal (cat) (dog) hair and dander: Secondary | ICD-10-CM | POA: Diagnosis not present

## 2020-11-08 DIAGNOSIS — J3089 Other allergic rhinitis: Secondary | ICD-10-CM | POA: Diagnosis not present

## 2020-11-08 DIAGNOSIS — J301 Allergic rhinitis due to pollen: Secondary | ICD-10-CM | POA: Diagnosis not present

## 2020-11-12 DIAGNOSIS — J3089 Other allergic rhinitis: Secondary | ICD-10-CM | POA: Diagnosis not present

## 2020-11-12 DIAGNOSIS — J301 Allergic rhinitis due to pollen: Secondary | ICD-10-CM | POA: Diagnosis not present

## 2020-11-12 DIAGNOSIS — J3081 Allergic rhinitis due to animal (cat) (dog) hair and dander: Secondary | ICD-10-CM | POA: Diagnosis not present

## 2020-11-19 DIAGNOSIS — J3089 Other allergic rhinitis: Secondary | ICD-10-CM | POA: Diagnosis not present

## 2020-11-19 DIAGNOSIS — J301 Allergic rhinitis due to pollen: Secondary | ICD-10-CM | POA: Diagnosis not present

## 2020-11-19 DIAGNOSIS — J3081 Allergic rhinitis due to animal (cat) (dog) hair and dander: Secondary | ICD-10-CM | POA: Diagnosis not present

## 2020-11-21 DIAGNOSIS — H1045 Other chronic allergic conjunctivitis: Secondary | ICD-10-CM | POA: Diagnosis not present

## 2020-11-21 DIAGNOSIS — J3089 Other allergic rhinitis: Secondary | ICD-10-CM | POA: Diagnosis not present

## 2020-11-21 DIAGNOSIS — J301 Allergic rhinitis due to pollen: Secondary | ICD-10-CM | POA: Diagnosis not present

## 2020-11-21 DIAGNOSIS — J4520 Mild intermittent asthma, uncomplicated: Secondary | ICD-10-CM | POA: Diagnosis not present

## 2020-11-26 DIAGNOSIS — J3081 Allergic rhinitis due to animal (cat) (dog) hair and dander: Secondary | ICD-10-CM | POA: Diagnosis not present

## 2020-11-26 DIAGNOSIS — J3089 Other allergic rhinitis: Secondary | ICD-10-CM | POA: Diagnosis not present

## 2020-11-26 DIAGNOSIS — J301 Allergic rhinitis due to pollen: Secondary | ICD-10-CM | POA: Diagnosis not present

## 2020-12-04 ENCOUNTER — Ambulatory Visit: Payer: Federal, State, Local not specified - PPO | Admitting: Family Medicine

## 2020-12-04 ENCOUNTER — Other Ambulatory Visit: Payer: Self-pay

## 2020-12-04 VITALS — BP 131/79 | HR 77 | Temp 97.4°F | Ht 70.0 in | Wt 174.0 lb

## 2020-12-04 DIAGNOSIS — E039 Hypothyroidism, unspecified: Secondary | ICD-10-CM

## 2020-12-04 DIAGNOSIS — Z1211 Encounter for screening for malignant neoplasm of colon: Secondary | ICD-10-CM | POA: Diagnosis not present

## 2020-12-04 DIAGNOSIS — Z23 Encounter for immunization: Secondary | ICD-10-CM | POA: Diagnosis not present

## 2020-12-04 DIAGNOSIS — E785 Hyperlipidemia, unspecified: Secondary | ICD-10-CM | POA: Diagnosis not present

## 2020-12-04 DIAGNOSIS — J3089 Other allergic rhinitis: Secondary | ICD-10-CM | POA: Diagnosis not present

## 2020-12-04 DIAGNOSIS — J301 Allergic rhinitis due to pollen: Secondary | ICD-10-CM | POA: Diagnosis not present

## 2020-12-04 DIAGNOSIS — J3081 Allergic rhinitis due to animal (cat) (dog) hair and dander: Secondary | ICD-10-CM | POA: Diagnosis not present

## 2020-12-04 NOTE — Progress Notes (Signed)
[name]   Established Patient Office Visit  Subjective:  Patient ID: Don Hurst, male    DOB: 07/01/1960  Age: 60 y.o. MRN: [id number]  CC:  Chief Complaint  Patient presents with   Follow-up    6 month follow up    HPI Don Hurst is a very pleasant 60 year old male with a medical history of acquired hypothyroidism that presents for 80-month follow-up.  Patient says that he has been doing well and is without complaint.  He says that he has not been following a low-fat, low carbohydrate diet.  He exercises periodically.  He typically remains active.  Patient had a history of chronic fatigue following COVID-19 infection, which is improved.  He has been taking levothyroxine consistently.  He denies any constipation, intolerance to heat or cold, diarrhea, excessive sweating, weight loss or weight gain.  Past Medical History:  Diagnosis Date   Allergy    Arthritis    Asthma    GERD (gastroesophageal reflux disease)    History of chronic bronchitis    Multiple allergies    Wears glasses     Past Surgical History:  Procedure Laterality Date   COLONOSCOPY     INGUINAL HERNIA REPAIR  02/2004   rt   INGUINAL HERNIA REPAIR Left 02/06/2016   Procedure: LAPAROSCOPIC LEFT INGUINAL HERNIA REPAIR;  Surgeon: Clovis Riley, MD;  Location: [facility];  Service: General;  Laterality: Left;   INSERTION OF MESH Left 02/06/2016   Procedure: INSERTION OF MESH;  Surgeon: Clovis Riley, MD;  Location: [facility];  Service: General;  Laterality: Left;   PAROTIDECTOMY Left 02/08/2014   Procedure: LEFT SUPERFICIAL PAROTIDECTOMY;  Surgeon: Jodi Marble, MD;  Location: [facility];  Service: ENT;  Laterality: Left;    Family History  Problem Relation Age of Onset   Hypertension Mother        per pt dx around 61 y o   Cancer Father 74       colon   Eczema Daughter    Hypertension Sister     Social History   Socioeconomic History    Marital status: Married    Spouse name: Not on file   Number of children: Not on file   Years of education: Not on file   Highest education level: Not on file  Occupational History   Not on file  Tobacco Use   Smoking status: Former    Packs/day: 0.25    Years: 3.00    Pack years: 0.75    Types: Cigarettes    Quit date: 03/26/1991    Years since quitting: 29.7   Smokeless tobacco: Never   Tobacco comments:    smoked 1 pk every 2 wks  Vaping Use   Vaping Use: Never used  Substance and Sexual Activity   Alcohol use: No   Drug use: No   Sexual activity: Yes    Comment: number of sex partners in the last 12 months  1  Other Topics Concern   Not on file  Social History Narrative   Exercise doing weights 4 times per week for 30 minutes   Social Determinants of Health   Financial Resource Strain: Not on file  Food Insecurity: Not on file  Transportation Needs: Not on file  Physical Activity: Not on file  Stress: Not on file  Social Connections: Not on file  Intimate Partner Violence: Not on file    Outpatient Medications Prior to  Visit  Medication Sig Dispense Refill   azelastine (ASTELIN) 0.1 % nasal spray Place 1 spray into both nostrils 2 (two) times daily. Use in each nostril as directed 30 mL 5   budesonide-formoterol (SYMBICORT) 80-4.5 MCG/ACT inhaler Inhale 2 puffs into the lungs 2 (two) times daily. 10.2 g 2   Cholecalciferol (VITAMIN D PO) Take 1 tablet by mouth daily.      Cyanocobalamin (VITAMIN B 12 PO) Take by mouth.     levothyroxine (SYNTHROID) 112 MCG tablet TAKE 1 TABLET(112 MCG) BY MOUTH EVERY MORNING 90 tablet 2   montelukast (SINGULAIR) 10 MG tablet Take 1 tablet (10 mg total) by mouth at bedtime. 90 tablet 3   Omega-3 Fatty Acids (FISH OIL) 1000 MG CPDR Take by mouth.     omeprazole (PRILOSEC) 20 MG capsule Take 20 mg by mouth daily as needed (heartburn).      PRESCRIPTION MEDICATION Allergy shots weekly     PROAIR HFA 108 (90 Base) MCG/ACT inhaler  1-2 puffs every 4-6 hours as needed for shortness of breath or wheezing 8.5 g 0   triamcinolone ointment (KENALOG) 0.5 % Apply 1 application topically 2 (two) times daily. 15 g 0   vitamin C (ASCORBIC ACID) 500 MG tablet Take 500 mg by mouth daily.     VITAMIN E PO Take 1 tablet by mouth daily.      No facility-administered medications prior to visit.    Allergies  Allergen Reactions   Lactose Intolerance (Gi)     ROS Review of Systems  Constitutional:  Negative for activity change and appetite change.  HENT: Negative.    Respiratory: Negative.    Cardiovascular: Negative.   Gastrointestinal: Negative.   Endocrine: Negative.  Negative for cold intolerance and heat intolerance.  Genitourinary: Negative.   Musculoskeletal:  Positive for arthralgias and back pain.  Skin: Negative.   Allergic/Immunologic: Negative.   Neurological: Negative.   Psychiatric/Behavioral: Negative.       Objective:    Physical Exam Constitutional:      Appearance: Normal appearance.  Eyes:     Pupils: Pupils are equal, round, and reactive to light.  Cardiovascular:     Rate and Rhythm: Normal rate and regular rhythm.  Pulmonary:     Effort: Pulmonary effort is normal.  Abdominal:     General: Bowel sounds are normal.  Musculoskeletal:        General: Normal range of motion.  Skin:    General: Skin is warm.  Neurological:     General: No focal deficit present.     Mental Status: He is alert. Mental status is at baseline.    BP 131/79 (BP Location: Right Arm, Patient Position: Sitting)   Pulse 77   Temp (!) 97.4 F (36.3 C)   Ht 5\' 10"  (1.778 m)   Wt 174 lb 0.6 oz (78.9 kg)   SpO2 99%   BMI 24.97 kg/m  Wt Readings from Last 3 Encounters:  12/04/20 174 lb 0.6 oz (78.9 kg)  05/29/20 181 lb (82.1 kg)  10/18/19 170 lb 9.6 oz (77.4 kg)     Health Maintenance Due  Topic Date Due   COLONOSCOPY (Pts 45-20yrs Insurance coverage will need to be confirmed)  Never done   Zoster Vaccines-  Shingrix (1 of 2) Never done   COVID-19 Vaccine (4 - Booster for Pfizer series) 03/22/2020   INFLUENZA VACCINE  09/24/2020    There are no preventive care reminders to display for this patient.  Lab Results  Component Value Date   TSH 1.330 05/29/2020   Lab Results  Component Value Date   WBC 4.7 07/12/2019   HGB 14.3 07/12/2019   HCT 43.9 07/12/2019   MCV 94 07/12/2019   PLT 226 07/12/2019   Lab Results  Component Value Date   NA 139 07/12/2019   K 4.1 07/12/2019   CO2 21 07/12/2019   GLUCOSE 90 07/12/2019   BUN 11 07/12/2019   CREATININE 1.04 07/12/2019   BILITOT 0.3 11/23/2017   ALKPHOS 91 11/23/2017   AST 24 11/23/2017   ALT 26 11/23/2017   PROT 7.4 11/23/2017   ALBUMIN 4.3 11/23/2017   CALCIUM 9.4 07/12/2019   Lab Results  Component Value Date   CHOL 223 (H) 05/29/2020   Lab Results  Component Value Date   HDL 53 05/29/2020   Lab Results  Component Value Date   LDLCALC 153 (H) 05/29/2020   Lab Results  Component Value Date   TRIG 97 05/29/2020   Lab Results  Component Value Date   CHOLHDL 4.2 05/29/2020   Lab Results  Component Value Date   HGBA1C 5.6 04/21/2016      Assessment & Plan:   Problem List Items Addressed This Visit   None Visit Diagnoses     Acquired hypothyroidism    -  Primary   Relevant Orders   Thyroid Panel With TSH   Hyperlipidemia LDL goal <100       Relevant Orders   Lipid Panel       Follow-up: Return in about 6 months (around 06/04/2021) for hyperlipidemia.   [name]  APRN, MSN, FNP-C Patient [facility] [address] [place], [place] [address] [phone]

## 2020-12-05 LAB — LIPID PANEL
Chol/HDL Ratio: 4 ratio (ref 0.0–5.0)
Cholesterol, Total: 190 mg/dL (ref 100–199)
HDL: 48 mg/dL (ref >39)
LDL Chol Calc (NIH): 129 mg/dL — ABNORMAL HIGH (ref 0–99)
Triglycerides: 68 mg/dL (ref 0–149)
VLDL Cholesterol Cal: 13 mg/dL (ref 5–40)

## 2020-12-05 LAB — THYROID PANEL WITH TSH
Free Thyroxine Index: 2.3 (ref 1.2–4.9)
T3 Uptake Ratio: 36 % (ref 24–39)
T4, Total: 6.3 ug/dL (ref 4.5–12.0)
TSH: 0.217 u[IU]/mL — ABNORMAL LOW (ref 0.450–4.500)

## 2020-12-11 ENCOUNTER — Encounter: Payer: Self-pay | Admitting: Family Medicine

## 2020-12-11 ENCOUNTER — Other Ambulatory Visit: Payer: Self-pay | Admitting: Family Medicine

## 2020-12-11 ENCOUNTER — Telehealth: Payer: Self-pay | Admitting: Family Medicine

## 2020-12-11 DIAGNOSIS — J3089 Other allergic rhinitis: Secondary | ICD-10-CM | POA: Diagnosis not present

## 2020-12-11 DIAGNOSIS — J3081 Allergic rhinitis due to animal (cat) (dog) hair and dander: Secondary | ICD-10-CM | POA: Diagnosis not present

## 2020-12-11 DIAGNOSIS — J301 Allergic rhinitis due to pollen: Secondary | ICD-10-CM | POA: Diagnosis not present

## 2020-12-11 DIAGNOSIS — E039 Hypothyroidism, unspecified: Secondary | ICD-10-CM

## 2020-12-11 MED ORDER — LEVOTHYROXINE SODIUM 100 MCG PO TABS: 100.0000 ug | ORAL_TABLET | Freq: Every day | ORAL | 2 refills | Status: DC

## 2020-12-11 NOTE — Telephone Encounter (Signed)
Patient notified of results and new rx. Lab appointment scheduled.

## 2020-12-11 NOTE — Telephone Encounter (Signed)
Kole Hilyard is a 60 year old male with a medical history significant for acquired hypothyroidism.  Reviewed TSH, below normal range.  We will decrease levothyroxine to 100 mcg daily.  Will recheck thyroid panel in 4 weeks.  Please make lab appointment for this patient.   [name]  APRN, MSN, FNP-C Patient [facility] [address] [place], [place] [address] [phone]

## 2020-12-20 DIAGNOSIS — J301 Allergic rhinitis due to pollen: Secondary | ICD-10-CM | POA: Diagnosis not present

## 2020-12-20 DIAGNOSIS — J3081 Allergic rhinitis due to animal (cat) (dog) hair and dander: Secondary | ICD-10-CM | POA: Diagnosis not present

## 2020-12-20 DIAGNOSIS — J3089 Other allergic rhinitis: Secondary | ICD-10-CM | POA: Diagnosis not present

## 2020-12-24 DIAGNOSIS — J301 Allergic rhinitis due to pollen: Secondary | ICD-10-CM | POA: Diagnosis not present

## 2020-12-24 DIAGNOSIS — J3081 Allergic rhinitis due to animal (cat) (dog) hair and dander: Secondary | ICD-10-CM | POA: Diagnosis not present

## 2020-12-24 DIAGNOSIS — J3089 Other allergic rhinitis: Secondary | ICD-10-CM | POA: Diagnosis not present

## 2021-01-02 DIAGNOSIS — J3081 Allergic rhinitis due to animal (cat) (dog) hair and dander: Secondary | ICD-10-CM | POA: Diagnosis not present

## 2021-01-02 DIAGNOSIS — J301 Allergic rhinitis due to pollen: Secondary | ICD-10-CM | POA: Diagnosis not present

## 2021-01-02 DIAGNOSIS — J3089 Other allergic rhinitis: Secondary | ICD-10-CM | POA: Diagnosis not present

## 2021-01-07 DIAGNOSIS — J301 Allergic rhinitis due to pollen: Secondary | ICD-10-CM | POA: Diagnosis not present

## 2021-01-07 DIAGNOSIS — J3089 Other allergic rhinitis: Secondary | ICD-10-CM | POA: Diagnosis not present

## 2021-01-07 DIAGNOSIS — J3081 Allergic rhinitis due to animal (cat) (dog) hair and dander: Secondary | ICD-10-CM | POA: Diagnosis not present

## 2021-01-08 ENCOUNTER — Other Ambulatory Visit: Payer: Self-pay

## 2021-01-08 ENCOUNTER — Other Ambulatory Visit: Payer: Federal, State, Local not specified - PPO

## 2021-01-08 DIAGNOSIS — E039 Hypothyroidism, unspecified: Secondary | ICD-10-CM

## 2021-01-09 LAB — THYROID PANEL WITH TSH
Free Thyroxine Index: 1.7 (ref 1.2–4.9)
T3 Uptake Ratio: 33 % (ref 24–39)
T4, Total: 5.2 ug/dL (ref 4.5–12.0)
TSH: 0.725 u[IU]/mL (ref 0.450–4.500)

## 2021-01-14 DIAGNOSIS — J301 Allergic rhinitis due to pollen: Secondary | ICD-10-CM | POA: Diagnosis not present

## 2021-01-14 DIAGNOSIS — J3081 Allergic rhinitis due to animal (cat) (dog) hair and dander: Secondary | ICD-10-CM | POA: Diagnosis not present

## 2021-01-14 DIAGNOSIS — J3089 Other allergic rhinitis: Secondary | ICD-10-CM | POA: Diagnosis not present

## 2021-01-23 DIAGNOSIS — J301 Allergic rhinitis due to pollen: Secondary | ICD-10-CM | POA: Diagnosis not present

## 2021-01-23 DIAGNOSIS — J3089 Other allergic rhinitis: Secondary | ICD-10-CM | POA: Diagnosis not present

## 2021-01-23 DIAGNOSIS — J3081 Allergic rhinitis due to animal (cat) (dog) hair and dander: Secondary | ICD-10-CM | POA: Diagnosis not present

## 2021-01-28 DIAGNOSIS — J3081 Allergic rhinitis due to animal (cat) (dog) hair and dander: Secondary | ICD-10-CM | POA: Diagnosis not present

## 2021-01-28 DIAGNOSIS — J301 Allergic rhinitis due to pollen: Secondary | ICD-10-CM | POA: Diagnosis not present

## 2021-01-28 DIAGNOSIS — J3089 Other allergic rhinitis: Secondary | ICD-10-CM | POA: Diagnosis not present

## 2021-02-05 ENCOUNTER — Other Ambulatory Visit: Payer: Self-pay

## 2021-02-05 DIAGNOSIS — E039 Hypothyroidism, unspecified: Secondary | ICD-10-CM

## 2021-02-05 MED ORDER — LEVOTHYROXINE SODIUM 100 MCG PO TABS
100.0000 ug | ORAL_TABLET | Freq: Every day | ORAL | 2 refills | Status: DC
Start: 2021-02-05 — End: 2021-06-25

## 2021-02-11 DIAGNOSIS — J301 Allergic rhinitis due to pollen: Secondary | ICD-10-CM | POA: Diagnosis not present

## 2021-02-11 DIAGNOSIS — J3081 Allergic rhinitis due to animal (cat) (dog) hair and dander: Secondary | ICD-10-CM | POA: Diagnosis not present

## 2021-02-11 DIAGNOSIS — J3089 Other allergic rhinitis: Secondary | ICD-10-CM | POA: Diagnosis not present

## 2021-02-20 DIAGNOSIS — J301 Allergic rhinitis due to pollen: Secondary | ICD-10-CM | POA: Diagnosis not present

## 2021-02-20 DIAGNOSIS — J3089 Other allergic rhinitis: Secondary | ICD-10-CM | POA: Diagnosis not present

## 2021-02-20 DIAGNOSIS — J3081 Allergic rhinitis due to animal (cat) (dog) hair and dander: Secondary | ICD-10-CM | POA: Diagnosis not present

## 2021-02-27 DIAGNOSIS — J301 Allergic rhinitis due to pollen: Secondary | ICD-10-CM | POA: Diagnosis not present

## 2021-02-27 DIAGNOSIS — J3089 Other allergic rhinitis: Secondary | ICD-10-CM | POA: Diagnosis not present

## 2021-02-27 DIAGNOSIS — J3081 Allergic rhinitis due to animal (cat) (dog) hair and dander: Secondary | ICD-10-CM | POA: Diagnosis not present

## 2021-03-04 DIAGNOSIS — J3089 Other allergic rhinitis: Secondary | ICD-10-CM | POA: Diagnosis not present

## 2021-03-04 DIAGNOSIS — J3081 Allergic rhinitis due to animal (cat) (dog) hair and dander: Secondary | ICD-10-CM | POA: Diagnosis not present

## 2021-03-04 DIAGNOSIS — J301 Allergic rhinitis due to pollen: Secondary | ICD-10-CM | POA: Diagnosis not present

## 2021-03-12 DIAGNOSIS — J301 Allergic rhinitis due to pollen: Secondary | ICD-10-CM | POA: Diagnosis not present

## 2021-03-12 DIAGNOSIS — J3081 Allergic rhinitis due to animal (cat) (dog) hair and dander: Secondary | ICD-10-CM | POA: Diagnosis not present

## 2021-03-12 DIAGNOSIS — J3089 Other allergic rhinitis: Secondary | ICD-10-CM | POA: Diagnosis not present

## 2021-03-18 DIAGNOSIS — J3089 Other allergic rhinitis: Secondary | ICD-10-CM | POA: Diagnosis not present

## 2021-03-18 DIAGNOSIS — J3081 Allergic rhinitis due to animal (cat) (dog) hair and dander: Secondary | ICD-10-CM | POA: Diagnosis not present

## 2021-03-18 DIAGNOSIS — J301 Allergic rhinitis due to pollen: Secondary | ICD-10-CM | POA: Diagnosis not present

## 2021-03-22 DIAGNOSIS — J301 Allergic rhinitis due to pollen: Secondary | ICD-10-CM | POA: Diagnosis not present

## 2021-03-25 DIAGNOSIS — J3081 Allergic rhinitis due to animal (cat) (dog) hair and dander: Secondary | ICD-10-CM | POA: Diagnosis not present

## 2021-03-25 DIAGNOSIS — J3089 Other allergic rhinitis: Secondary | ICD-10-CM | POA: Diagnosis not present

## 2021-03-25 DIAGNOSIS — J301 Allergic rhinitis due to pollen: Secondary | ICD-10-CM | POA: Diagnosis not present

## 2021-04-09 DIAGNOSIS — J3089 Other allergic rhinitis: Secondary | ICD-10-CM | POA: Diagnosis not present

## 2021-04-09 DIAGNOSIS — J3081 Allergic rhinitis due to animal (cat) (dog) hair and dander: Secondary | ICD-10-CM | POA: Diagnosis not present

## 2021-04-09 DIAGNOSIS — J301 Allergic rhinitis due to pollen: Secondary | ICD-10-CM | POA: Diagnosis not present

## 2021-04-17 DIAGNOSIS — J3081 Allergic rhinitis due to animal (cat) (dog) hair and dander: Secondary | ICD-10-CM | POA: Diagnosis not present

## 2021-04-17 DIAGNOSIS — J301 Allergic rhinitis due to pollen: Secondary | ICD-10-CM | POA: Diagnosis not present

## 2021-04-17 DIAGNOSIS — J3089 Other allergic rhinitis: Secondary | ICD-10-CM | POA: Diagnosis not present

## 2021-04-23 DIAGNOSIS — J3089 Other allergic rhinitis: Secondary | ICD-10-CM | POA: Diagnosis not present

## 2021-04-23 DIAGNOSIS — J3081 Allergic rhinitis due to animal (cat) (dog) hair and dander: Secondary | ICD-10-CM | POA: Diagnosis not present

## 2021-04-23 DIAGNOSIS — J301 Allergic rhinitis due to pollen: Secondary | ICD-10-CM | POA: Diagnosis not present

## 2021-04-29 DIAGNOSIS — J301 Allergic rhinitis due to pollen: Secondary | ICD-10-CM | POA: Diagnosis not present

## 2021-04-29 DIAGNOSIS — J3089 Other allergic rhinitis: Secondary | ICD-10-CM | POA: Diagnosis not present

## 2021-04-29 DIAGNOSIS — J3081 Allergic rhinitis due to animal (cat) (dog) hair and dander: Secondary | ICD-10-CM | POA: Diagnosis not present

## 2021-05-15 DIAGNOSIS — J3081 Allergic rhinitis due to animal (cat) (dog) hair and dander: Secondary | ICD-10-CM | POA: Diagnosis not present

## 2021-05-15 DIAGNOSIS — J301 Allergic rhinitis due to pollen: Secondary | ICD-10-CM | POA: Diagnosis not present

## 2021-05-15 DIAGNOSIS — J3089 Other allergic rhinitis: Secondary | ICD-10-CM | POA: Diagnosis not present

## 2021-05-22 DIAGNOSIS — J301 Allergic rhinitis due to pollen: Secondary | ICD-10-CM | POA: Diagnosis not present

## 2021-05-22 DIAGNOSIS — J3081 Allergic rhinitis due to animal (cat) (dog) hair and dander: Secondary | ICD-10-CM | POA: Diagnosis not present

## 2021-05-22 DIAGNOSIS — J3089 Other allergic rhinitis: Secondary | ICD-10-CM | POA: Diagnosis not present

## 2021-05-27 DIAGNOSIS — J301 Allergic rhinitis due to pollen: Secondary | ICD-10-CM | POA: Diagnosis not present

## 2021-05-27 DIAGNOSIS — J3089 Other allergic rhinitis: Secondary | ICD-10-CM | POA: Diagnosis not present

## 2021-05-27 DIAGNOSIS — J3081 Allergic rhinitis due to animal (cat) (dog) hair and dander: Secondary | ICD-10-CM | POA: Diagnosis not present

## 2021-06-04 ENCOUNTER — Ambulatory Visit: Payer: Federal, State, Local not specified - PPO | Admitting: Family Medicine

## 2021-06-11 ENCOUNTER — Encounter: Payer: Self-pay | Admitting: Family Medicine

## 2021-06-11 ENCOUNTER — Ambulatory Visit: Payer: Federal, State, Local not specified - PPO | Admitting: Family Medicine

## 2021-06-11 VITALS — BP 125/89 | HR 83 | Temp 97.7°F | Ht 70.0 in | Wt 178.0 lb

## 2021-06-11 DIAGNOSIS — E039 Hypothyroidism, unspecified: Secondary | ICD-10-CM

## 2021-06-11 DIAGNOSIS — Z1211 Encounter for screening for malignant neoplasm of colon: Secondary | ICD-10-CM | POA: Diagnosis not present

## 2021-06-11 DIAGNOSIS — E785 Hyperlipidemia, unspecified: Secondary | ICD-10-CM | POA: Diagnosis not present

## 2021-06-11 DIAGNOSIS — J3089 Other allergic rhinitis: Secondary | ICD-10-CM | POA: Diagnosis not present

## 2021-06-11 DIAGNOSIS — J3081 Allergic rhinitis due to animal (cat) (dog) hair and dander: Secondary | ICD-10-CM | POA: Diagnosis not present

## 2021-06-11 DIAGNOSIS — J301 Allergic rhinitis due to pollen: Secondary | ICD-10-CM | POA: Diagnosis not present

## 2021-06-11 DIAGNOSIS — J45909 Unspecified asthma, uncomplicated: Secondary | ICD-10-CM | POA: Diagnosis not present

## 2021-06-11 NOTE — Progress Notes (Signed)
?Patient [facility]  ? ? ?Subjective   ?Patient ID: Don Hurst, male    DOB: 1960/02/27  Age: 61 y.o. MRN: [id number] ? ?Chief Complaint  ?Patient presents with  ? Follow-up  ?  Patient is here today for his 6 month follow up visit with no concerns or issues to discuss.  ? ? ?Don Hurst is a very pleasant 61 year old male with a medical history significant for hypothyroidism and hyperlipidemia presents for follow-up of chronic conditions.  Patient states that he has been doing well and is without complaint. ? ?Thyroid Problem ?Presents for follow-up visit. Patient reports no anxiety, cold intolerance, constipation, depressed mood, diarrhea, hair loss, heat intolerance, leg swelling, menstrual problem, nail problem, tremors, visual change, weight gain or weight loss. The symptoms have been stable. His past medical history is significant for hyperlipidemia.  ?Hyperlipidemia ?This is a chronic problem. The problem is uncontrolled. Exacerbating diseases include hypothyroidism. The current treatment provides no improvement of lipids. Risk factors for coronary artery disease include dyslipidemia.  ? ? ?Past Medical History:  ?Diagnosis Date  ? Allergy   ? Arthritis   ? Asthma   ? GERD (gastroesophageal reflux disease)   ? History of chronic bronchitis   ? Multiple allergies   ? Wears glasses   ?  ? ?Review of Systems  ?Constitutional:  Negative for weight gain and weight loss.  ?HENT: Negative.    ?Eyes: Negative.   ?Respiratory: Negative.    ?Cardiovascular: Negative.   ?Gastrointestinal:  Negative for constipation and diarrhea.  ?Genitourinary: Negative.  Negative for menstrual problem.  ?Musculoskeletal: Negative.   ?Skin: Negative.   ?Neurological: Negative.  Negative for tremors.  ?Endo/Heme/Allergies:  Negative for cold intolerance and heat intolerance.  ?Psychiatric/Behavioral: Negative.  The patient is not nervous/anxious.   ? ?  ?Objective:  ?  ? ?BP 125/89   Pulse  83   Temp 97.7 ?F (36.5 ?C)   Ht '5\' 10"'$  (1.778 m)   Wt 178 lb (80.7 kg)   SpO2 99%   BMI 25.54 kg/m?  ?BP Readings from Last 3 Encounters:  ?06/11/21 125/89  ?12/04/20 131/79  ?05/29/20 136/84  ? ?Wt Readings from Last 3 Encounters:  ?06/11/21 178 lb (80.7 kg)  ?12/04/20 174 lb 0.6 oz (78.9 kg)  ?05/29/20 181 lb (82.1 kg)  ? ?  ? ?Physical Exam ?Constitutional:   ?   Appearance: Normal appearance.  ?Eyes:  ?   Pupils: Pupils are equal, round, and reactive to light.  ?Cardiovascular:  ?   Rate and Rhythm: Normal rate and regular rhythm.  ?Pulmonary:  ?   Effort: Pulmonary effort is normal.  ?   Breath sounds: Normal breath sounds.  ?Abdominal:  ?   General: Bowel sounds are normal.  ?Musculoskeletal:     ?   General: Normal range of motion.  ?Skin: ?   General: Skin is warm.  ?Neurological:  ?   General: No focal deficit present.  ?   Mental Status: He is alert. Mental status is at baseline.  ?Psychiatric:     ?   Mood and Affect: Mood normal.     ?   Behavior: Behavior normal.     ?   Thought Content: Thought content normal.     ?   Judgment: Judgment normal.  ? ? ? ?No results found for any visits on 06/11/21. ? ?Last lipids ?Lab Results  ?Component Value Date  ? CHOL 190 12/04/2020  ?  HDL 48 12/04/2020  ? LDLCALC 129 (H) 12/04/2020  ? TRIG 68 12/04/2020  ? CHOLHDL 4.0 12/04/2020  ? ?Last hemoglobin A1c ?Lab Results  ?Component Value Date  ? HGBA1C 5.6 04/21/2016  ? ?Last thyroid functions ?Lab Results  ?Component Value Date  ? TSH 0.725 01/08/2021  ? T4TOTAL 5.2 01/08/2021  ? ?Last vitamin D ?Lab Results  ?Component Value Date  ? VD25OH 35 06/26/2011  ? ?Last vitamin B12 and Folate ?No results found for: VITAMINB12, FOLATE ?  ? ?The 10-year ASCVD risk score (Arnett DK, et al., 2019) is: 12.2% ? ?  ?Assessment & Plan:  ? ?Problem List Items Addressed This Visit   ?None ?Visit Diagnoses   ? ? Acquired hypothyroidism    -  Primary  ? Relevant Orders  ? Thyroid Panel With TSH  ? Hyperlipidemia LDL goal <100       ? Relevant Medications  ? EPINEPHrine 0.3 mg/0.3 mL IJ SOAJ injection  ? Other Relevant Orders  ? Lipid Panel  ? Moderate asthma without complication, unspecified whether persistent      ? Colon cancer screening      ? Relevant Orders  ? Ambulatory referral to Gastroenterology  ? ?  ?1. Acquired hypothyroidism ? ?- Thyroid Panel With TSH ? ?2. Hyperlipidemia LDL goal <100 ?The 10-year ASCVD risk score (Arnett DK, et al., 2019) is: 12.2% ?  Values used to calculate the score: ?    Age: 74 years ?    Sex: Male ?    Is Non-Hispanic African American: Yes ?    Diabetic: No ?    Tobacco smoker: Yes ?    Systolic Blood Pressure: 536 mmHg ?    Is BP treated: No ?    HDL Cholesterol: 48 mg/dL ?    Total Cholesterol: 190 mg/dL ? ?- Lipid Panel ? ?3. Moderate asthma without complication, unspecified whether persistent ? ? ?4. Colon cancer screening ? ?- Ambulatory referral to Gastroenterology  ? ?Return in about 6 months (around 12/11/2021) for hyperlipidemia.  ? ? ?[name]  APRN, MSN, FNP-C ?Patient [facility] ?[facility] ?[address]  ?[place], [place] [address] ?[phone] ? ? ?

## 2021-06-12 LAB — LIPID PANEL
Chol/HDL Ratio: 4.8 ratio (ref 0.0–5.0)
Cholesterol, Total: 230 mg/dL — ABNORMAL HIGH (ref 100–199)
HDL: 48 mg/dL (ref >39)
LDL Chol Calc (NIH): 169 mg/dL — ABNORMAL HIGH (ref 0–99)
Triglycerides: 77 mg/dL (ref 0–149)
VLDL Cholesterol Cal: 13 mg/dL (ref 5–40)

## 2021-06-12 LAB — THYROID PANEL WITH TSH
Free Thyroxine Index: 1.9 (ref 1.2–4.9)
T3 Uptake Ratio: 34 % (ref 24–39)
T4, Total: 5.6 ug/dL (ref 4.5–12.0)
TSH: 1.57 u[IU]/mL (ref 0.450–4.500)

## 2021-06-14 ENCOUNTER — Other Ambulatory Visit: Payer: Self-pay | Admitting: Family Medicine

## 2021-06-14 DIAGNOSIS — E785 Hyperlipidemia, unspecified: Secondary | ICD-10-CM | POA: Insufficient documentation

## 2021-06-14 MED ORDER — ATORVASTATIN CALCIUM 20 MG PO TABS: 20.0000 mg | ORAL_TABLET | Freq: Every day | ORAL | 3 refills | Status: DC

## 2021-06-14 NOTE — Progress Notes (Signed)
Reviewed all laboratory values from primary care visit on 06/11/2021.  Thyroid panel negative, no changes in medications at this time.  Patient's total cholesterol is elevated at 230, goal is less than 200 and LDL is elevated at 169, goal is less than 100.  At this point, patient will benefit from atorvastatin 20 mg with dinner.  Also, recommend a low-fat, low-cholesterol diet divided over small meals throughout the day.  Also, recommend increasing daily physical activity.  We will recheck cholesterol levels in 6 months.  Follow-up as scheduled.

## 2021-06-14 NOTE — Progress Notes (Signed)
Meds ordered this encounter  ?Medications  ? atorvastatin (LIPITOR) 20 MG tablet  ?  Sig: Take 1 tablet (20 mg total) by mouth daily.  ?  Dispense:  90 tablet  ?  Refill:  3  ?  Order Specific Question:   Supervising Provider  ?  AnswerTresa Garter [[id number]]  ?  ? ?[name]  APRN, MSN, FNP-C ?Patient [place] ?[facility] ?[address]  ?[place], [place] [address] ?[phone] ? ?

## 2021-06-17 DIAGNOSIS — J301 Allergic rhinitis due to pollen: Secondary | ICD-10-CM | POA: Diagnosis not present

## 2021-06-17 DIAGNOSIS — J3081 Allergic rhinitis due to animal (cat) (dog) hair and dander: Secondary | ICD-10-CM | POA: Diagnosis not present

## 2021-06-17 DIAGNOSIS — J3089 Other allergic rhinitis: Secondary | ICD-10-CM | POA: Diagnosis not present

## 2021-06-24 ENCOUNTER — Telehealth: Payer: Self-pay | Admitting: Family Medicine

## 2021-06-24 NOTE — Telephone Encounter (Signed)
[facility] requesting refill on Levothyroxine 0.'100mg'$  ? ?

## 2021-06-25 ENCOUNTER — Telehealth: Payer: Self-pay

## 2021-06-25 ENCOUNTER — Other Ambulatory Visit: Payer: Self-pay | Admitting: Family Medicine

## 2021-06-25 DIAGNOSIS — J3089 Other allergic rhinitis: Secondary | ICD-10-CM | POA: Diagnosis not present

## 2021-06-25 DIAGNOSIS — E039 Hypothyroidism, unspecified: Secondary | ICD-10-CM

## 2021-06-25 DIAGNOSIS — J301 Allergic rhinitis due to pollen: Secondary | ICD-10-CM | POA: Diagnosis not present

## 2021-06-25 DIAGNOSIS — J3081 Allergic rhinitis due to animal (cat) (dog) hair and dander: Secondary | ICD-10-CM | POA: Diagnosis not present

## 2021-06-25 MED ORDER — LEVOTHYROXINE SODIUM 100 MCG PO TABS: 100.0000 ug | ORAL_TABLET | Freq: Every day | ORAL | 1 refills | Status: DC

## 2021-06-25 NOTE — Progress Notes (Signed)
Meds ordered this encounter  ?Medications  ? levothyroxine (SYNTHROID) 100 MCG tablet  ?  Sig: Take 1 tablet (100 mcg total) by mouth daily before breakfast.  ?  Dispense:  90 tablet  ?  Refill:  1  ?  Order Specific Question:   Supervising Provider  ?  AnswerTresa Garter [[id number]]  ? [name]  APRN, MSN, FNP-C ?Patient [place] ?[facility] ?[address]  ?[place], [place] [address] ?[phone] ? ?

## 2021-06-25 NOTE — Telephone Encounter (Signed)
Refills were sent

## 2021-06-25 NOTE — Telephone Encounter (Signed)
Levothyroxine 0.100 mcg tab ?

## 2021-07-04 DIAGNOSIS — J301 Allergic rhinitis due to pollen: Secondary | ICD-10-CM | POA: Diagnosis not present

## 2021-07-04 DIAGNOSIS — J3089 Other allergic rhinitis: Secondary | ICD-10-CM | POA: Diagnosis not present

## 2021-07-04 DIAGNOSIS — J3081 Allergic rhinitis due to animal (cat) (dog) hair and dander: Secondary | ICD-10-CM | POA: Diagnosis not present

## 2021-07-10 DIAGNOSIS — J301 Allergic rhinitis due to pollen: Secondary | ICD-10-CM | POA: Diagnosis not present

## 2021-07-10 DIAGNOSIS — J3089 Other allergic rhinitis: Secondary | ICD-10-CM | POA: Diagnosis not present

## 2021-07-10 DIAGNOSIS — J3081 Allergic rhinitis due to animal (cat) (dog) hair and dander: Secondary | ICD-10-CM | POA: Diagnosis not present

## 2021-07-17 DIAGNOSIS — J3081 Allergic rhinitis due to animal (cat) (dog) hair and dander: Secondary | ICD-10-CM | POA: Diagnosis not present

## 2021-07-17 DIAGNOSIS — J301 Allergic rhinitis due to pollen: Secondary | ICD-10-CM | POA: Diagnosis not present

## 2021-07-17 DIAGNOSIS — J3089 Other allergic rhinitis: Secondary | ICD-10-CM | POA: Diagnosis not present

## 2021-07-23 DIAGNOSIS — J3081 Allergic rhinitis due to animal (cat) (dog) hair and dander: Secondary | ICD-10-CM | POA: Diagnosis not present

## 2021-07-23 DIAGNOSIS — J301 Allergic rhinitis due to pollen: Secondary | ICD-10-CM | POA: Diagnosis not present

## 2021-07-23 DIAGNOSIS — J3089 Other allergic rhinitis: Secondary | ICD-10-CM | POA: Diagnosis not present

## 2021-08-05 DIAGNOSIS — J3089 Other allergic rhinitis: Secondary | ICD-10-CM | POA: Diagnosis not present

## 2021-08-05 DIAGNOSIS — J3081 Allergic rhinitis due to animal (cat) (dog) hair and dander: Secondary | ICD-10-CM | POA: Diagnosis not present

## 2021-08-05 DIAGNOSIS — J301 Allergic rhinitis due to pollen: Secondary | ICD-10-CM | POA: Diagnosis not present

## 2021-08-21 DIAGNOSIS — J3089 Other allergic rhinitis: Secondary | ICD-10-CM | POA: Diagnosis not present

## 2021-08-21 DIAGNOSIS — J301 Allergic rhinitis due to pollen: Secondary | ICD-10-CM | POA: Diagnosis not present

## 2021-08-21 DIAGNOSIS — J3081 Allergic rhinitis due to animal (cat) (dog) hair and dander: Secondary | ICD-10-CM | POA: Diagnosis not present

## 2021-09-05 DIAGNOSIS — J301 Allergic rhinitis due to pollen: Secondary | ICD-10-CM | POA: Diagnosis not present

## 2021-09-05 DIAGNOSIS — J3089 Other allergic rhinitis: Secondary | ICD-10-CM | POA: Diagnosis not present

## 2021-09-05 DIAGNOSIS — J3081 Allergic rhinitis due to animal (cat) (dog) hair and dander: Secondary | ICD-10-CM | POA: Diagnosis not present

## 2021-09-09 DIAGNOSIS — J301 Allergic rhinitis due to pollen: Secondary | ICD-10-CM | POA: Diagnosis not present

## 2021-09-09 DIAGNOSIS — J3081 Allergic rhinitis due to animal (cat) (dog) hair and dander: Secondary | ICD-10-CM | POA: Diagnosis not present

## 2021-09-09 DIAGNOSIS — J3089 Other allergic rhinitis: Secondary | ICD-10-CM | POA: Diagnosis not present

## 2021-09-25 DIAGNOSIS — J3089 Other allergic rhinitis: Secondary | ICD-10-CM | POA: Diagnosis not present

## 2021-09-25 DIAGNOSIS — J3081 Allergic rhinitis due to animal (cat) (dog) hair and dander: Secondary | ICD-10-CM | POA: Diagnosis not present

## 2021-09-25 DIAGNOSIS — J301 Allergic rhinitis due to pollen: Secondary | ICD-10-CM | POA: Diagnosis not present

## 2021-10-07 DIAGNOSIS — J3081 Allergic rhinitis due to animal (cat) (dog) hair and dander: Secondary | ICD-10-CM | POA: Diagnosis not present

## 2021-10-07 DIAGNOSIS — J301 Allergic rhinitis due to pollen: Secondary | ICD-10-CM | POA: Diagnosis not present

## 2021-10-07 DIAGNOSIS — J3089 Other allergic rhinitis: Secondary | ICD-10-CM | POA: Diagnosis not present

## 2021-10-14 DIAGNOSIS — J3081 Allergic rhinitis due to animal (cat) (dog) hair and dander: Secondary | ICD-10-CM | POA: Diagnosis not present

## 2021-10-14 DIAGNOSIS — J3089 Other allergic rhinitis: Secondary | ICD-10-CM | POA: Diagnosis not present

## 2021-10-14 DIAGNOSIS — J301 Allergic rhinitis due to pollen: Secondary | ICD-10-CM | POA: Diagnosis not present

## 2021-11-07 DIAGNOSIS — J3089 Other allergic rhinitis: Secondary | ICD-10-CM | POA: Diagnosis not present

## 2021-11-07 DIAGNOSIS — J3081 Allergic rhinitis due to animal (cat) (dog) hair and dander: Secondary | ICD-10-CM | POA: Diagnosis not present

## 2021-11-07 DIAGNOSIS — J301 Allergic rhinitis due to pollen: Secondary | ICD-10-CM | POA: Diagnosis not present

## 2021-11-13 DIAGNOSIS — J3089 Other allergic rhinitis: Secondary | ICD-10-CM | POA: Diagnosis not present

## 2021-11-13 DIAGNOSIS — J3081 Allergic rhinitis due to animal (cat) (dog) hair and dander: Secondary | ICD-10-CM | POA: Diagnosis not present

## 2021-11-13 DIAGNOSIS — J301 Allergic rhinitis due to pollen: Secondary | ICD-10-CM | POA: Diagnosis not present

## 2021-11-18 DIAGNOSIS — J3081 Allergic rhinitis due to animal (cat) (dog) hair and dander: Secondary | ICD-10-CM | POA: Diagnosis not present

## 2021-11-18 DIAGNOSIS — J301 Allergic rhinitis due to pollen: Secondary | ICD-10-CM | POA: Diagnosis not present

## 2021-11-18 DIAGNOSIS — J3089 Other allergic rhinitis: Secondary | ICD-10-CM | POA: Diagnosis not present

## 2021-11-25 DIAGNOSIS — J3081 Allergic rhinitis due to animal (cat) (dog) hair and dander: Secondary | ICD-10-CM | POA: Diagnosis not present

## 2021-11-25 DIAGNOSIS — J3089 Other allergic rhinitis: Secondary | ICD-10-CM | POA: Diagnosis not present

## 2021-11-25 DIAGNOSIS — J301 Allergic rhinitis due to pollen: Secondary | ICD-10-CM | POA: Diagnosis not present

## 2021-11-27 DIAGNOSIS — J301 Allergic rhinitis due to pollen: Secondary | ICD-10-CM | POA: Diagnosis not present

## 2021-11-28 DIAGNOSIS — J3081 Allergic rhinitis due to animal (cat) (dog) hair and dander: Secondary | ICD-10-CM | POA: Diagnosis not present

## 2021-11-28 DIAGNOSIS — J301 Allergic rhinitis due to pollen: Secondary | ICD-10-CM | POA: Diagnosis not present

## 2021-11-28 DIAGNOSIS — J3089 Other allergic rhinitis: Secondary | ICD-10-CM | POA: Diagnosis not present

## 2021-12-02 DIAGNOSIS — J3089 Other allergic rhinitis: Secondary | ICD-10-CM | POA: Diagnosis not present

## 2021-12-02 DIAGNOSIS — J3081 Allergic rhinitis due to animal (cat) (dog) hair and dander: Secondary | ICD-10-CM | POA: Diagnosis not present

## 2021-12-02 DIAGNOSIS — J301 Allergic rhinitis due to pollen: Secondary | ICD-10-CM | POA: Diagnosis not present

## 2021-12-17 ENCOUNTER — Encounter: Payer: Self-pay | Admitting: Family Medicine

## 2021-12-17 ENCOUNTER — Ambulatory Visit: Payer: Federal, State, Local not specified - PPO | Admitting: Family Medicine

## 2021-12-17 VITALS — BP 132/86 | HR 67 | Temp 98.8°F | Ht 70.0 in | Wt 183.2 lb

## 2021-12-17 DIAGNOSIS — E785 Hyperlipidemia, unspecified: Secondary | ICD-10-CM | POA: Diagnosis not present

## 2021-12-17 DIAGNOSIS — J3081 Allergic rhinitis due to animal (cat) (dog) hair and dander: Secondary | ICD-10-CM | POA: Diagnosis not present

## 2021-12-17 DIAGNOSIS — E039 Hypothyroidism, unspecified: Secondary | ICD-10-CM

## 2021-12-17 DIAGNOSIS — R030 Elevated blood-pressure reading, without diagnosis of hypertension: Secondary | ICD-10-CM | POA: Diagnosis not present

## 2021-12-17 DIAGNOSIS — J301 Allergic rhinitis due to pollen: Secondary | ICD-10-CM | POA: Diagnosis not present

## 2021-12-17 DIAGNOSIS — J3089 Other allergic rhinitis: Secondary | ICD-10-CM | POA: Diagnosis not present

## 2021-12-17 NOTE — Progress Notes (Signed)
Established Patient Office Visit  Subjective   Patient ID: Don Hurst, male    DOB: 1960-10-17  Age: 61 y.o. MRN: [id number]  Chief Complaint  Patient presents with   Follow-up    Cholesterol     Don Hurst is a very pleasant 61 year old male with a medical history significant for hypothyroidism, mild intermittent asthma, and hyperlipidemia that presents for follow-up of chronic conditions.  Patient has been doing well.  He states that he has been following a low-fat diet and remaining active.  Also, patient has been taking all prescribed medications consistently. Patient denies any weight loss, weight gain, constipation, diarrhea, sweating, intolerance to heat or cold.    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   IBS (irritable bowel syndrome) 06/26/2011   Degenerative joint disease involving multiple joints 06/26/2011   Family history of colon cancer 06/26/2011   Allergic rhinitis 06/26/2011   Hx of gastroesophageal reflux (GERD) 06/26/2011   Past Medical History:  Diagnosis Date   Allergy    Arthritis    Asthma    GERD (gastroesophageal reflux disease)    History of chronic bronchitis    Multiple allergies    Wears glasses    Past Surgical History:  Procedure Laterality Date   COLONOSCOPY     INGUINAL HERNIA REPAIR  02/2004   rt   INGUINAL HERNIA REPAIR Left 02/06/2016   Procedure: LAPAROSCOPIC LEFT INGUINAL HERNIA REPAIR;  Surgeon: Clovis Riley, MD;  Location: [facility];  Service: General;  Laterality: Left;   INSERTION OF MESH Left 02/06/2016   Procedure: INSERTION OF MESH;  Surgeon: Clovis Riley, MD;  Location: [facility];  Service:  General;  Laterality: Left;   PAROTIDECTOMY Left 02/08/2014   Procedure: LEFT SUPERFICIAL PAROTIDECTOMY;  Surgeon: Jodi Marble, MD;  Location: [facility];  Service: ENT;  Laterality: Left;   Social History   Tobacco Use   Smoking status: Former    Packs/day: 0.25    Years: 3.00    Total pack years: 0.75    Types: Cigarettes    Quit date: 03/26/1991    Years since quitting: 30.8   Smokeless tobacco: Never   Tobacco comments:    smoked 1 pk every 2 wks  Vaping Use   Vaping Use: Never used  Substance Use Topics   Alcohol use: No   Drug use: No   Social History   Socioeconomic History   Marital status: Married    Spouse name: Not on file   Number of children: Not on file   Years of education: Not on file   Highest education level: Not on file  Occupational History   Not on file  Tobacco Use   Smoking status: Former    Packs/day: 0.25    Years: 3.00    Total pack years: 0.75    Types: Cigarettes    Quit date: 03/26/1991    Years since quitting: 30.8   Smokeless tobacco: Never   Tobacco comments:    smoked 1 pk every 2 wks  Vaping Use   Vaping Use: Never used  Substance and Sexual Activity   Alcohol use: No   Drug use: No  Sexual activity: Yes    Comment: number of sex partners in the last 12 months  1  Other Topics Concern   Not on file  Social History Narrative   Exercise doing weights 4 times per week for 30 minutes   Social Determinants of Health   Financial Resource Strain: Not on file  Food Insecurity: Not on file  Transportation Needs: Not on file  Physical Activity: Not on file  Stress: Not on file  Social Connections: Not on file  Intimate Partner Violence: Not on file   Family Status  Relation Name Status   Mother  Deceased   Father  Deceased   Daughter  Alive   MGM  Deceased   MGF  Deceased   PGM  Deceased   Sister  Alive   Family History  Problem Relation Age of Onset   Hypertension Mother        per pt dx around 60  y o   Cancer Father 56       colon   Eczema Daughter    Hypertension Sister    Allergies  Allergen Reactions   Lactose Intolerance (Gi)       Review of Systems  Constitutional: Negative.   HENT: Negative.    Eyes: Negative.   Respiratory: Negative.    Cardiovascular: Negative.   Gastrointestinal: Negative.   Genitourinary: Negative.   Skin: Negative.   Neurological: Negative.   Endo/Heme/Allergies: Negative.   Psychiatric/Behavioral: Negative.        Objective:     BP 132/86   Pulse 67   Temp 98.8 F (37.1 C)   Ht '5\' 10"'$  (1.778 m)   Wt 183 lb 3.2 oz (83.1 kg)   SpO2 100%   BMI 26.29 kg/m  BP Readings from Last 3 Encounters:  12/17/21 132/86  06/11/21 125/89  12/04/20 131/79   Wt Readings from Last 3 Encounters:  12/17/21 183 lb 3.2 oz (83.1 kg)  06/11/21 178 lb (80.7 kg)  12/04/20 174 lb 0.6 oz (78.9 kg)   Physical Exam Constitutional:      Appearance: Normal appearance.  Eyes:     Pupils: Pupils are equal, round, and reactive to light.  Cardiovascular:     Rate and Rhythm: Normal rate and regular rhythm.     Pulses: Normal pulses.  Pulmonary:     Effort: Pulmonary effort is normal.  Abdominal:     General: Bowel sounds are normal.  Musculoskeletal:        General: Normal range of motion.  Skin:    General: Skin is warm.  Neurological:     General: No focal deficit present.     Mental Status: He is alert. Mental status is at baseline.  Psychiatric:        Mood and Affect: Mood normal.        Behavior: Behavior normal.        Thought Content: Thought content normal.        Judgment: Judgment normal.      Results for orders placed or performed in visit on 12/17/21  Thyroid Panel With TSH  Result Value Ref Range   TSH 2.070 0.450 - 4.500 uIU/mL   T4, Total 5.1 4.5 - 12.0 ug/dL   T3 Uptake Ratio 31 24 - 39 %   Free Thyroxine Index 1.6 1.2 - 4.9  Lipid Panel  Result Value Ref Range   Cholesterol, Total 144 100 - 199 mg/dL    Triglycerides 54 0 - 149 mg/dL  HDL 52 >39 mg/dL   VLDL Cholesterol Cal 12 5 - 40 mg/dL   LDL Chol Calc (NIH) 80 0 - 99 mg/dL   Chol/HDL Ratio 2.8 0.0 - 5.0 ratio    Last CBC Lab Results  Component Value Date   WBC 4.7 07/12/2019   HGB 14.3 07/12/2019   HCT 43.9 07/12/2019   MCV 94 07/12/2019   MCH 30.6 07/12/2019   RDW 13.0 07/12/2019   PLT 226 56/21/3086   Last metabolic panel Lab Results  Component Value Date   GLUCOSE 90 07/12/2019   NA 139 07/12/2019   K 4.1 07/12/2019   CL 103 07/12/2019   CO2 21 07/12/2019   BUN 11 07/12/2019   CREATININE 1.04 07/12/2019   GFRNONAA 80 07/12/2019   CALCIUM 9.4 07/12/2019   PROT 7.4 11/23/2017   ALBUMIN 4.3 11/23/2017   LABGLOB 3.1 11/23/2017   AGRATIO 1.4 11/23/2017   BILITOT 0.3 11/23/2017   ALKPHOS 91 11/23/2017   AST 24 11/23/2017   ALT 26 11/23/2017   Last lipids Lab Results  Component Value Date   CHOL 144 12/17/2021   HDL 52 12/17/2021   LDLCALC 80 12/17/2021   TRIG 54 12/17/2021   CHOLHDL 2.8 12/17/2021   Last hemoglobin A1c Lab Results  Component Value Date   HGBA1C 5.6 04/21/2016   Last thyroid functions Lab Results  Component Value Date   TSH 2.070 12/17/2021   T4TOTAL 5.1 12/17/2021   Last vitamin D Lab Results  Component Value Date   VD25OH 35 06/26/2011   Last vitamin B12 and Folate No results found for: "VITAMINB12", "FOLATE"    The 10-year ASCVD risk score (Arnett DK, et al., 2019) is: 7.2%    Assessment & Plan:   Problem List Items Addressed This Visit       Other   Hyperlipidemia LDL goal <100   Relevant Orders   Lipid Panel (Completed)   Other Visit Diagnoses     Acquired hypothyroidism    -  Primary   Relevant Orders   Thyroid Panel With TSH (Completed)   Blood pressure elevated without history of HTN          Blood pressure elevated.  Patient will return for blood pressure check in 2 weeks.  No antihypertensives at this time. We will continue to follow lipid panel.   Recommend statin therapy be continued.  Also, low-fat, low carbohydrate diet divided over small meals.  Will review thyroid panel as results become available.  No medication changes prior to reviewing lab results.     Return in about 6 months (around 06/18/2022) for hyperlipidemia.   [name]  APRN, MSN, FNP-C Patient [place] [address] [place], [place] [address] [phone]

## 2021-12-18 LAB — THYROID PANEL WITH TSH
Free Thyroxine Index: 1.6 (ref 1.2–4.9)
T3 Uptake Ratio: 31 % (ref 24–39)
T4, Total: 5.1 ug/dL (ref 4.5–12.0)
TSH: 2.07 u[IU]/mL (ref 0.450–4.500)

## 2021-12-18 LAB — LIPID PANEL
Chol/HDL Ratio: 2.8 ratio (ref 0.0–5.0)
Cholesterol, Total: 144 mg/dL (ref 100–199)
HDL: 52 mg/dL (ref >39)
LDL Chol Calc (NIH): 80 mg/dL (ref 0–99)
Triglycerides: 54 mg/dL (ref 0–149)
VLDL Cholesterol Cal: 12 mg/dL (ref 5–40)

## 2021-12-19 NOTE — Progress Notes (Signed)
Don Hurst is a 61 year old male with a medical history significant for hypothyroidism and hyperlipidemia that presented for his 91-monthfollow-up.  Please inform patient that his daily exercise is definitely beneficial.  His cholesterol has lowered significantly.  6 months prior, total cholesterol was 230 and was 144 at his appointment.  LDL cholesterol was previously 169 and is now 80.  Please commend him for a great job.  Also, thyroid within normal range and there will be no changes to his medications at this time.  We will follow-up as scheduled.  LDonia Pounds APRN, MSN, FNP-C Patient [facility] [place] [phone]

## 2021-12-26 DIAGNOSIS — J3089 Other allergic rhinitis: Secondary | ICD-10-CM | POA: Diagnosis not present

## 2021-12-26 DIAGNOSIS — J3081 Allergic rhinitis due to animal (cat) (dog) hair and dander: Secondary | ICD-10-CM | POA: Diagnosis not present

## 2021-12-26 DIAGNOSIS — J301 Allergic rhinitis due to pollen: Secondary | ICD-10-CM | POA: Diagnosis not present

## 2021-12-31 ENCOUNTER — Other Ambulatory Visit: Payer: Self-pay | Admitting: Family Medicine

## 2021-12-31 DIAGNOSIS — E039 Hypothyroidism, unspecified: Secondary | ICD-10-CM

## 2022-01-01 DIAGNOSIS — J301 Allergic rhinitis due to pollen: Secondary | ICD-10-CM | POA: Diagnosis not present

## 2022-01-01 DIAGNOSIS — J3089 Other allergic rhinitis: Secondary | ICD-10-CM | POA: Diagnosis not present

## 2022-01-01 DIAGNOSIS — J3081 Allergic rhinitis due to animal (cat) (dog) hair and dander: Secondary | ICD-10-CM | POA: Diagnosis not present

## 2022-01-06 DIAGNOSIS — J3089 Other allergic rhinitis: Secondary | ICD-10-CM | POA: Diagnosis not present

## 2022-01-06 DIAGNOSIS — J3081 Allergic rhinitis due to animal (cat) (dog) hair and dander: Secondary | ICD-10-CM | POA: Diagnosis not present

## 2022-01-06 DIAGNOSIS — J301 Allergic rhinitis due to pollen: Secondary | ICD-10-CM | POA: Diagnosis not present

## 2022-01-13 DIAGNOSIS — J301 Allergic rhinitis due to pollen: Secondary | ICD-10-CM | POA: Diagnosis not present

## 2022-01-13 DIAGNOSIS — J3081 Allergic rhinitis due to animal (cat) (dog) hair and dander: Secondary | ICD-10-CM | POA: Diagnosis not present

## 2022-01-13 DIAGNOSIS — J3089 Other allergic rhinitis: Secondary | ICD-10-CM | POA: Diagnosis not present

## 2022-02-05 DIAGNOSIS — J301 Allergic rhinitis due to pollen: Secondary | ICD-10-CM | POA: Diagnosis not present

## 2022-02-05 DIAGNOSIS — J3081 Allergic rhinitis due to animal (cat) (dog) hair and dander: Secondary | ICD-10-CM | POA: Diagnosis not present

## 2022-02-05 DIAGNOSIS — J3089 Other allergic rhinitis: Secondary | ICD-10-CM | POA: Diagnosis not present

## 2022-02-10 DIAGNOSIS — J301 Allergic rhinitis due to pollen: Secondary | ICD-10-CM | POA: Diagnosis not present

## 2022-02-10 DIAGNOSIS — J3081 Allergic rhinitis due to animal (cat) (dog) hair and dander: Secondary | ICD-10-CM | POA: Diagnosis not present

## 2022-02-10 DIAGNOSIS — J3089 Other allergic rhinitis: Secondary | ICD-10-CM | POA: Diagnosis not present

## 2022-02-19 DIAGNOSIS — J3081 Allergic rhinitis due to animal (cat) (dog) hair and dander: Secondary | ICD-10-CM | POA: Diagnosis not present

## 2022-02-19 DIAGNOSIS — J3089 Other allergic rhinitis: Secondary | ICD-10-CM | POA: Diagnosis not present

## 2022-02-19 DIAGNOSIS — J301 Allergic rhinitis due to pollen: Secondary | ICD-10-CM | POA: Diagnosis not present

## 2022-02-26 DIAGNOSIS — J301 Allergic rhinitis due to pollen: Secondary | ICD-10-CM | POA: Diagnosis not present

## 2022-02-26 DIAGNOSIS — J3081 Allergic rhinitis due to animal (cat) (dog) hair and dander: Secondary | ICD-10-CM | POA: Diagnosis not present

## 2022-02-26 DIAGNOSIS — J3089 Other allergic rhinitis: Secondary | ICD-10-CM | POA: Diagnosis not present

## 2022-03-03 DIAGNOSIS — J3089 Other allergic rhinitis: Secondary | ICD-10-CM | POA: Diagnosis not present

## 2022-03-03 DIAGNOSIS — J3081 Allergic rhinitis due to animal (cat) (dog) hair and dander: Secondary | ICD-10-CM | POA: Diagnosis not present

## 2022-03-03 DIAGNOSIS — J301 Allergic rhinitis due to pollen: Secondary | ICD-10-CM | POA: Diagnosis not present

## 2022-03-19 DIAGNOSIS — J3081 Allergic rhinitis due to animal (cat) (dog) hair and dander: Secondary | ICD-10-CM | POA: Diagnosis not present

## 2022-03-19 DIAGNOSIS — J3089 Other allergic rhinitis: Secondary | ICD-10-CM | POA: Diagnosis not present

## 2022-03-19 DIAGNOSIS — J301 Allergic rhinitis due to pollen: Secondary | ICD-10-CM | POA: Diagnosis not present

## 2022-03-24 DIAGNOSIS — J3089 Other allergic rhinitis: Secondary | ICD-10-CM | POA: Diagnosis not present

## 2022-03-24 DIAGNOSIS — J3081 Allergic rhinitis due to animal (cat) (dog) hair and dander: Secondary | ICD-10-CM | POA: Diagnosis not present

## 2022-03-24 DIAGNOSIS — J301 Allergic rhinitis due to pollen: Secondary | ICD-10-CM | POA: Diagnosis not present

## 2022-03-31 DIAGNOSIS — J301 Allergic rhinitis due to pollen: Secondary | ICD-10-CM | POA: Diagnosis not present

## 2022-03-31 DIAGNOSIS — J3089 Other allergic rhinitis: Secondary | ICD-10-CM | POA: Diagnosis not present

## 2022-03-31 DIAGNOSIS — J3081 Allergic rhinitis due to animal (cat) (dog) hair and dander: Secondary | ICD-10-CM | POA: Diagnosis not present

## 2022-04-09 DIAGNOSIS — J3081 Allergic rhinitis due to animal (cat) (dog) hair and dander: Secondary | ICD-10-CM | POA: Diagnosis not present

## 2022-04-09 DIAGNOSIS — J301 Allergic rhinitis due to pollen: Secondary | ICD-10-CM | POA: Diagnosis not present

## 2022-04-09 DIAGNOSIS — J3089 Other allergic rhinitis: Secondary | ICD-10-CM | POA: Diagnosis not present

## 2022-04-23 DIAGNOSIS — J3089 Other allergic rhinitis: Secondary | ICD-10-CM | POA: Diagnosis not present

## 2022-04-23 DIAGNOSIS — J3081 Allergic rhinitis due to animal (cat) (dog) hair and dander: Secondary | ICD-10-CM | POA: Diagnosis not present

## 2022-04-23 DIAGNOSIS — J301 Allergic rhinitis due to pollen: Secondary | ICD-10-CM | POA: Diagnosis not present

## 2022-04-29 DIAGNOSIS — J3089 Other allergic rhinitis: Secondary | ICD-10-CM | POA: Diagnosis not present

## 2022-04-29 DIAGNOSIS — J3081 Allergic rhinitis due to animal (cat) (dog) hair and dander: Secondary | ICD-10-CM | POA: Diagnosis not present

## 2022-04-29 DIAGNOSIS — J301 Allergic rhinitis due to pollen: Secondary | ICD-10-CM | POA: Diagnosis not present

## 2022-05-05 DIAGNOSIS — J3089 Other allergic rhinitis: Secondary | ICD-10-CM | POA: Diagnosis not present

## 2022-05-05 DIAGNOSIS — J3081 Allergic rhinitis due to animal (cat) (dog) hair and dander: Secondary | ICD-10-CM | POA: Diagnosis not present

## 2022-05-05 DIAGNOSIS — J301 Allergic rhinitis due to pollen: Secondary | ICD-10-CM | POA: Diagnosis not present

## 2022-05-12 DIAGNOSIS — J3089 Other allergic rhinitis: Secondary | ICD-10-CM | POA: Diagnosis not present

## 2022-05-12 DIAGNOSIS — J301 Allergic rhinitis due to pollen: Secondary | ICD-10-CM | POA: Diagnosis not present

## 2022-05-12 DIAGNOSIS — J3081 Allergic rhinitis due to animal (cat) (dog) hair and dander: Secondary | ICD-10-CM | POA: Diagnosis not present

## 2022-05-26 DIAGNOSIS — J3081 Allergic rhinitis due to animal (cat) (dog) hair and dander: Secondary | ICD-10-CM | POA: Diagnosis not present

## 2022-05-26 DIAGNOSIS — J3089 Other allergic rhinitis: Secondary | ICD-10-CM | POA: Diagnosis not present

## 2022-05-26 DIAGNOSIS — J301 Allergic rhinitis due to pollen: Secondary | ICD-10-CM | POA: Diagnosis not present

## 2022-06-02 DIAGNOSIS — J301 Allergic rhinitis due to pollen: Secondary | ICD-10-CM | POA: Diagnosis not present

## 2022-06-02 DIAGNOSIS — J3081 Allergic rhinitis due to animal (cat) (dog) hair and dander: Secondary | ICD-10-CM | POA: Diagnosis not present

## 2022-06-02 DIAGNOSIS — J3089 Other allergic rhinitis: Secondary | ICD-10-CM | POA: Diagnosis not present

## 2022-06-09 DIAGNOSIS — J301 Allergic rhinitis due to pollen: Secondary | ICD-10-CM | POA: Diagnosis not present

## 2022-06-09 DIAGNOSIS — J3081 Allergic rhinitis due to animal (cat) (dog) hair and dander: Secondary | ICD-10-CM | POA: Diagnosis not present

## 2022-06-09 DIAGNOSIS — J3089 Other allergic rhinitis: Secondary | ICD-10-CM | POA: Diagnosis not present

## 2022-06-12 ENCOUNTER — Other Ambulatory Visit: Payer: Self-pay | Admitting: Family Medicine

## 2022-06-12 DIAGNOSIS — E785 Hyperlipidemia, unspecified: Secondary | ICD-10-CM

## 2022-06-17 ENCOUNTER — Ambulatory Visit (INDEPENDENT_AMBULATORY_CARE_PROVIDER_SITE_OTHER): Payer: Federal, State, Local not specified - PPO | Admitting: Family Medicine

## 2022-06-17 ENCOUNTER — Ambulatory Visit: Payer: Self-pay | Admitting: Family Medicine

## 2022-06-17 VITALS — BP 127/79 | HR 90 | Temp 97.7°F | Ht 70.0 in | Wt 177.2 lb

## 2022-06-17 DIAGNOSIS — J3089 Other allergic rhinitis: Secondary | ICD-10-CM | POA: Diagnosis not present

## 2022-06-17 DIAGNOSIS — G4700 Insomnia, unspecified: Secondary | ICD-10-CM | POA: Diagnosis not present

## 2022-06-17 DIAGNOSIS — Z1211 Encounter for screening for malignant neoplasm of colon: Secondary | ICD-10-CM | POA: Diagnosis not present

## 2022-06-17 DIAGNOSIS — E785 Hyperlipidemia, unspecified: Secondary | ICD-10-CM | POA: Diagnosis not present

## 2022-06-17 DIAGNOSIS — J3081 Allergic rhinitis due to animal (cat) (dog) hair and dander: Secondary | ICD-10-CM | POA: Diagnosis not present

## 2022-06-17 DIAGNOSIS — E039 Hypothyroidism, unspecified: Secondary | ICD-10-CM

## 2022-06-17 DIAGNOSIS — J301 Allergic rhinitis due to pollen: Secondary | ICD-10-CM | POA: Diagnosis not present

## 2022-06-17 DIAGNOSIS — J309 Allergic rhinitis, unspecified: Secondary | ICD-10-CM

## 2022-06-17 MED ORDER — OLOPATADINE HCL 0.1 % OP SOLN: 1.0000 [drp] | Freq: Two times a day (BID) | OPHTHALMIC | 12 refills | Status: DC

## 2022-06-17 NOTE — Progress Notes (Signed)
Established Patient Office Visit  Subjective   Patient ID: Don Hurst, male    DOB: 1960/07/28  Age: 62 y.o. MRN: [id number]  Chief Complaint  Patient presents with   Follow-up    Don Hurst is a very pleasant 62 year old male with a medical history significant for hypothyroidism, mild intermittent asthma, environmental allergies, and hyperlipidemia presents for 58-month follow-up of chronic conditions. Today, Don Hurst says that he feels well and is without complaint.  Patient says that he has been exercising intermittently.  He has tried to remain active outside in his yard.  He does not consistently follow a low-fat diet.  Patient has been taking atorvastatin 20 mg daily. Patient's asthma has been very well-controlled and without exacerbation over the past several years.  Patient has a history of hypothyroidism and is levothyroxine for therapy.  He has been taking this medication consistently.  No constipation, weight loss, weight gain, heart palpitations, depression, or dizziness.      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   IBS (irritable bowel syndrome) 06/26/2011   Degenerative joint disease involving multiple joints 06/26/2011   Family history of colon cancer 06/26/2011   Allergic rhinitis 06/26/2011   Hx of gastroesophageal reflux (GERD) 06/26/2011   Past Medical History:  Diagnosis Date   Allergy    Arthritis    Asthma    GERD (gastroesophageal reflux disease)    History of chronic bronchitis    Multiple allergies    Wears glasses    Past Surgical History:  Procedure Laterality Date   COLONOSCOPY     INGUINAL HERNIA REPAIR  02/2004   rt    INGUINAL HERNIA REPAIR Left 02/06/2016   Procedure: LAPAROSCOPIC LEFT INGUINAL HERNIA REPAIR;  Surgeon: Berna Bue, MD;  Location: [facility];  Service: General;  Laterality: Left;   INSERTION OF MESH Left 02/06/2016   Procedure: INSERTION OF MESH;  Surgeon: Berna Bue, MD;  Location: [facility];  Service: General;  Laterality: Left;   PAROTIDECTOMY Left 02/08/2014   Procedure: LEFT SUPERFICIAL PAROTIDECTOMY;  Surgeon: Flo Shanks, MD;  Location: [facility];  Service: ENT;  Laterality: Left;   Social History   Tobacco Use   Smoking status: Former    Packs/day: 0.25    Years: 3.00    Additional pack years: 0.00    Total pack years: 0.75    Types: Cigarettes    Quit date: 03/26/1991    Years since quitting: 31.2   Smokeless tobacco: Never   Tobacco comments:    smoked 1 pk every 2 wks  Vaping Use   Vaping Use: Never used  Substance Use Topics   Alcohol use: No   Drug use: No   Social History   Socioeconomic History   Marital status: Married    Spouse name: Not on file   Number of children: Not on file   Years of education: Not on file   Highest education level: Not on file  Occupational History   Not on file  Tobacco Use   Smoking status: Former    Packs/day: 0.25    Years: 3.00    Additional pack years: 0.00    Total  pack years: 0.75    Types: Cigarettes    Quit date: 03/26/1991    Years since quitting: 31.2   Smokeless tobacco: Never   Tobacco comments:    smoked 1 pk every 2 wks  Vaping Use   Vaping Use: Never used  Substance and Sexual Activity   Alcohol use: No   Drug use: No   Sexual activity: Yes    Comment: number of sex partners in the last 12 months  1  Other Topics Concern   Not on file  Social History Narrative   Exercise doing weights 4 times per week for 30 minutes   Social Determinants of Health   Financial Resource Strain: Not on file  Food Insecurity: Not on file  Transportation Needs: Not on file  Physical  Activity: Not on file  Stress: Not on file  Social Connections: Not on file  Intimate Partner Violence: Not on file   Family Status  Relation Name Status   Mother  Deceased   Father  Deceased   Daughter  Alive   MGM  Deceased   MGF  Deceased   PGM  Deceased   Sister  Alive   Family History  Problem Relation Age of Onset   Hypertension Mother        per pt dx around 25 y o   Cancer Father 24       colon   Eczema Daughter    Hypertension Sister    Allergies  Allergen Reactions   Bee Pollen    Lactose Intolerance (Gi)       Review of Systems  Constitutional: Negative.   HENT: Negative.    Eyes: Negative.   Respiratory: Negative.    Cardiovascular: Negative.   Gastrointestinal:  Negative for nausea and vomiting.  Genitourinary: Negative.   Musculoskeletal: Negative.   Skin: Negative.   Neurological: Negative.   Endo/Heme/Allergies: Negative.   Psychiatric/Behavioral: Negative.        Objective:     BP 127/79   Pulse 90   Temp 97.7 F (36.5 C)   Ht 5\' 10"  (1.778 m)   Wt 177 lb 3.2 oz (80.4 kg)   SpO2 98%   BMI 25.43 kg/m  BP Readings from Last 3 Encounters:  06/17/22 127/79  12/17/21 132/86  06/11/21 125/89   Wt Readings from Last 3 Encounters:  06/17/22 177 lb 3.2 oz (80.4 kg)  12/17/21 183 lb 3.2 oz (83.1 kg)  06/11/21 178 lb (80.7 kg)      Physical Exam Constitutional:      Appearance: Normal appearance.  Eyes:     Pupils: Pupils are equal, round, and reactive to light.  Cardiovascular:     Rate and Rhythm: Normal rate and regular rhythm.     Pulses: Normal pulses.  Pulmonary:     Effort: Pulmonary effort is normal.  Abdominal:     General: Bowel sounds are normal.  Skin:    General: Skin is warm.  Neurological:     General: No focal deficit present.     Mental Status: He is alert. Mental status is at baseline.  Psychiatric:        Mood and Affect: Mood normal.        Behavior: Behavior normal.        Thought Content: Thought  content normal.        Judgment: Judgment normal.      Results for orders placed or performed in visit on 06/17/22  Thyroid Panel With  TSH  Result Value Ref Range   TSH 2.480 0.450 - 4.500 uIU/mL   T4, Total 4.9 4.5 - 12.0 ug/dL   T3 Uptake Ratio 33 24 - 39 %   Free Thyroxine Index 1.6 1.2 - 4.9  Lipid Panel  Result Value Ref Range   Cholesterol, Total 149 100 - 199 mg/dL   Triglycerides 55 0 - 149 mg/dL   HDL 52 >96 mg/dL   VLDL Cholesterol Cal 12 5 - 40 mg/dL   LDL Chol Calc (NIH) 85 0 - 99 mg/dL   Chol/HDL Ratio 2.9 0.0 - 5.0 ratio    Last CBC Lab Results  Component Value Date   WBC 4.7 07/12/2019   HGB 14.3 07/12/2019   HCT 43.9 07/12/2019   MCV 94 07/12/2019   MCH 30.6 07/12/2019   RDW 13.0 07/12/2019   PLT 226 07/12/2019   Last metabolic panel Lab Results  Component Value Date   GLUCOSE 90 07/12/2019   NA 139 07/12/2019   K 4.1 07/12/2019   CL 103 07/12/2019   CO2 21 07/12/2019   BUN 11 07/12/2019   CREATININE 1.04 07/12/2019   GFRNONAA 80 07/12/2019   CALCIUM 9.4 07/12/2019   PROT 7.4 11/23/2017   ALBUMIN 4.3 11/23/2017   LABGLOB 3.1 11/23/2017   AGRATIO 1.4 11/23/2017   BILITOT 0.3 11/23/2017   ALKPHOS 91 11/23/2017   AST 24 11/23/2017   ALT 26 11/23/2017   Last lipids Lab Results  Component Value Date   CHOL 149 06/17/2022   HDL 52 06/17/2022   LDLCALC 85 06/17/2022   TRIG 55 06/17/2022   CHOLHDL 2.9 06/17/2022   Last hemoglobin A1c Lab Results  Component Value Date   HGBA1C 5.6 04/21/2016   Last thyroid functions Lab Results  Component Value Date   TSH 2.480 06/17/2022   T4TOTAL 4.9 06/17/2022   Last vitamin D Lab Results  Component Value Date   VD25OH 35 06/26/2011   Last vitamin B12 and Folate No results found for: "VITAMINB12", "FOLATE"    The 10-year ASCVD risk score (Arnett DK, et al., 2019) is: 7.1%    Assessment & Plan:   Problem List Items Addressed This Visit       Respiratory   Allergic rhinitis    Relevant Medications   olopatadine (PATADAY) 0.1 % ophthalmic solution     Other   Hyperlipidemia LDL goal <100   Relevant Orders   Lipid Panel (Completed)   Other Visit Diagnoses     Acquired hypothyroidism    -  Primary   Relevant Orders   Thyroid Panel With TSH (Completed)   Colon cancer screening       Relevant Orders   Cologuard   Insomnia, unspecified type         1. Acquired hypothyroidism  - Thyroid Panel With TSH  2. Hyperlipidemia LDL goal <100 The 10-year ASCVD risk score (Arnett DK, et al., 2019) is: 7.1%   Values used to calculate the score:     Age: 16 years     Sex: Male     Is Non-Hispanic African American: Yes     Diabetic: No     Tobacco smoker: No     Systolic Blood Pressure: 127 mmHg     Is BP treated: No     HDL Cholesterol: 52 mg/dL     Total Cholesterol: 149 mg/dL  - Lipid Panel  3. Colon cancer screening  - Cologuard  4. Insomnia, unspecified type Discussed at length, provided  written information  5. Allergic rhinitis, unspecified seasonality, unspecified trigger  - olopatadine (PATADAY) 0.1 % ophthalmic solution; Place 1 drop into both eyes 2 (two) times daily.  Dispense: 5 mL; Refill: 12   Return in about 6 months (around 12/17/2022).   [name]  APRN, MSN, FNP-C Patient Care [facility] [address] [place], [place] [address] [phone]

## 2022-06-17 NOTE — Patient Instructions (Signed)
Practice good sleep hygiene.  Stick to a sleep schedule, even on weekends. Exercise is great, but not too late in the day Avoid alcoholic drinks before bed Avoid large meals and beverages late before bed Don't take naps after 3 pm. Keep power naps less than 1 hour.  Relax before bed.  Take a hot bath before bed.  Have a good sleeping environment. Get rid of anything in your bedroom that might distract you from sleep.  Adopt good sleeping posture.   

## 2022-06-18 LAB — LIPID PANEL
Chol/HDL Ratio: 2.9 ratio (ref 0.0–5.0)
Cholesterol, Total: 149 mg/dL (ref 100–199)
HDL: 52 mg/dL (ref >39)
LDL Chol Calc (NIH): 85 mg/dL (ref 0–99)
Triglycerides: 55 mg/dL (ref 0–149)
VLDL Cholesterol Cal: 12 mg/dL (ref 5–40)

## 2022-06-18 LAB — THYROID PANEL WITH TSH
Free Thyroxine Index: 1.6 (ref 1.2–4.9)
T3 Uptake Ratio: 33 % (ref 24–39)
T4, Total: 4.9 ug/dL (ref 4.5–12.0)
TSH: 2.48 u[IU]/mL (ref 0.450–4.500)

## 2022-06-25 NOTE — Progress Notes (Signed)
Don Hurst, is a very pleasant 62 year old male with a medical history significant for hyperlipidemia and hypothyroidism that was seen in clinic for his 59-month follow-up.  Reviewed all laboratory values, no medication changes are warranted at this time.  Please continue low-fat diet divided over small meals throughout the day.  Recommend that patient increase his low impact cardiovascular exercise to remain active.  Will continue to follow-up every 3 months.  Please ensure that patient has a 63-month follow-up scheduled. [name]  APRN, MSN, FNP-C Patient Care [facility] [address] [place], [place] [address] [phone]

## 2022-06-26 ENCOUNTER — Encounter: Payer: Self-pay | Admitting: Family Medicine

## 2022-07-05 ENCOUNTER — Other Ambulatory Visit: Payer: Self-pay | Admitting: Family Medicine

## 2022-07-05 DIAGNOSIS — E039 Hypothyroidism, unspecified: Secondary | ICD-10-CM

## 2022-07-08 ENCOUNTER — Other Ambulatory Visit: Payer: Self-pay

## 2022-07-08 DIAGNOSIS — E039 Hypothyroidism, unspecified: Secondary | ICD-10-CM

## 2022-07-08 LAB — COLOGUARD: COLOGUARD: NEGATIVE

## 2022-07-08 MED ORDER — LEVOTHYROXINE SODIUM 100 MCG PO TABS
ORAL_TABLET | ORAL | 1 refills | Status: DC
Start: 2022-07-08 — End: 2022-09-18

## 2022-08-18 NOTE — Telephone Encounter (Signed)
Appt was made. Don Hurst 

## 2022-08-26 ENCOUNTER — Telehealth (INDEPENDENT_AMBULATORY_CARE_PROVIDER_SITE_OTHER): Payer: Federal, State, Local not specified - PPO | Admitting: Family Medicine

## 2022-08-26 VITALS — BP 126/81 | HR 89 | Ht 70.0 in | Wt 166.0 lb

## 2022-08-26 DIAGNOSIS — F32A Depression, unspecified: Secondary | ICD-10-CM

## 2022-08-26 DIAGNOSIS — Z131 Encounter for screening for diabetes mellitus: Secondary | ICD-10-CM

## 2022-08-26 DIAGNOSIS — F329 Major depressive disorder, single episode, unspecified: Secondary | ICD-10-CM

## 2022-08-26 DIAGNOSIS — R410 Disorientation, unspecified: Secondary | ICD-10-CM | POA: Diagnosis not present

## 2022-08-26 DIAGNOSIS — E039 Hypothyroidism, unspecified: Secondary | ICD-10-CM

## 2022-08-26 DIAGNOSIS — F419 Anxiety disorder, unspecified: Secondary | ICD-10-CM

## 2022-08-27 ENCOUNTER — Other Ambulatory Visit: Payer: Federal, State, Local not specified - PPO

## 2022-08-27 DIAGNOSIS — R410 Disorientation, unspecified: Secondary | ICD-10-CM | POA: Diagnosis not present

## 2022-08-27 DIAGNOSIS — Z131 Encounter for screening for diabetes mellitus: Secondary | ICD-10-CM | POA: Diagnosis not present

## 2022-08-27 DIAGNOSIS — E039 Hypothyroidism, unspecified: Secondary | ICD-10-CM

## 2022-08-28 LAB — CBC WITH DIFFERENTIAL/PLATELET
Basophils Absolute: 0 10*3/uL (ref 0.0–0.2)
Basos: 0 %
EOS (ABSOLUTE): 0 10*3/uL (ref 0.0–0.4)
Eos: 1 %
Hematocrit: 44.4 % (ref 37.5–51.0)
Hemoglobin: 14.2 g/dL (ref 13.0–17.7)
Immature Grans (Abs): 0 10*3/uL (ref 0.0–0.1)
Immature Granulocytes: 0 %
Lymphocytes Absolute: 1.5 10*3/uL (ref 0.7–3.1)
Lymphs: 31 %
MCH: 30.4 pg (ref 26.6–33.0)
MCHC: 32 g/dL (ref 31.5–35.7)
MCV: 95 fL (ref 79–97)
Monocytes Absolute: 0.6 10*3/uL (ref 0.1–0.9)
Monocytes: 13 %
Neutrophils Absolute: 2.6 10*3/uL (ref 1.4–7.0)
Neutrophils: 55 %
Platelets: 250 10*3/uL (ref 150–450)
RBC: 4.67 x10E6/uL (ref 4.14–5.80)
RDW: 12.8 % (ref 11.6–15.4)
WBC: 4.8 10*3/uL (ref 3.4–10.8)

## 2022-08-28 LAB — URINALYSIS, ROUTINE W REFLEX MICROSCOPIC
Bilirubin, UA: NEGATIVE
Glucose, UA: NEGATIVE
Ketones, UA: NEGATIVE
Leukocytes,UA: NEGATIVE
Nitrite, UA: NEGATIVE
RBC, UA: NEGATIVE
Specific Gravity, UA: 1.022 (ref 1.005–1.030)
Urobilinogen, Ur: 0.2 mg/dL (ref 0.2–1.0)
pH, UA: 6 (ref 5.0–7.5)

## 2022-08-28 LAB — CMP AND LIVER
ALT: 29 IU/L (ref 0–44)
AST: 20 IU/L (ref 0–40)
Albumin: 4.4 g/dL (ref 3.8–4.9)
Alkaline Phosphatase: 108 IU/L (ref 44–121)
BUN: 7 mg/dL (ref 6–24)
Bilirubin Total: 0.4 mg/dL (ref 0.0–1.2)
Bilirubin, Direct: 0.13 mg/dL (ref 0.00–0.40)
CO2: 26 mmol/L (ref 20–29)
Calcium: 9.2 mg/dL (ref 8.7–10.2)
Chloride: 103 mmol/L (ref 96–106)
Creatinine, Ser: 0.87 mg/dL (ref 0.76–1.27)
Glucose: 109 mg/dL — ABNORMAL HIGH (ref 70–99)
Potassium: 3.8 mmol/L (ref 3.5–5.2)
Sodium: 140 mmol/L (ref 134–144)
Total Protein: 6.8 g/dL (ref 6.0–8.5)
eGFR: 99 mL/min/{1.73_m2} (ref >59)

## 2022-08-28 LAB — THYROID PANEL WITH TSH
Free Thyroxine Index: 1.7 (ref 1.2–4.9)
T3 Uptake Ratio: 36 % (ref 24–39)
T4, Total: 4.8 ug/dL (ref 4.5–12.0)
TSH: 1.53 u[IU]/mL (ref 0.450–4.500)

## 2022-08-28 LAB — HEMOGLOBIN A1C
Est. average glucose Bld gHb Est-mCnc: 131 mg/dL
Hgb A1c MFr Bld: 6.2 % — ABNORMAL HIGH (ref 4.8–5.6)

## 2022-09-01 ENCOUNTER — Encounter: Payer: Self-pay | Admitting: Family Medicine
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
## 2022-09-05 ENCOUNTER — Ambulatory Visit (HOSPITAL_COMMUNITY)
Admission: EM | Admit: 2022-09-05 | Discharge: 2022-09-05 | Disposition: A | Discharge status: Other Acute Inpatient Hospital | Payer: Federal, State, Local not specified - PPO | Attending: Behavioral Health | Admitting: Behavioral Health

## 2022-09-05 ENCOUNTER — Inpatient Hospital Stay (HOSPITAL_COMMUNITY)
Admission: AD | Admit: 2022-09-05 | Discharge: 2022-09-18 | DRG: 883 | Disposition: A | Discharge status: Home / Self Care | Payer: Federal, State, Local not specified - PPO | Source: Intra-hospital | Attending: Psychiatry | Admitting: Psychiatry

## 2022-09-05 DIAGNOSIS — Z8616 Personal history of COVID-19: Secondary | ICD-10-CM | POA: Insufficient documentation

## 2022-09-05 DIAGNOSIS — J4520 Mild intermittent asthma, uncomplicated: Secondary | ICD-10-CM | POA: Diagnosis not present

## 2022-09-05 DIAGNOSIS — Z79890 Hormone replacement therapy (postmenopausal): Secondary | ICD-10-CM

## 2022-09-05 DIAGNOSIS — Z7951 Long term (current) use of inhaled steroids: Secondary | ICD-10-CM | POA: Diagnosis not present

## 2022-09-05 DIAGNOSIS — J449 Chronic obstructive pulmonary disease, unspecified: Secondary | ICD-10-CM | POA: Diagnosis not present

## 2022-09-05 DIAGNOSIS — F329 Major depressive disorder, single episode, unspecified: Secondary | ICD-10-CM | POA: Diagnosis not present

## 2022-09-05 DIAGNOSIS — F29 Unspecified psychosis not due to a substance or known physiological condition: Secondary | ICD-10-CM | POA: Diagnosis not present

## 2022-09-05 DIAGNOSIS — F23 Brief psychotic disorder: Secondary | ICD-10-CM | POA: Diagnosis not present

## 2022-09-05 DIAGNOSIS — Z79899 Other long term (current) drug therapy: Secondary | ICD-10-CM | POA: Insufficient documentation

## 2022-09-05 DIAGNOSIS — I10 Essential (primary) hypertension: Secondary | ICD-10-CM | POA: Diagnosis present

## 2022-09-05 DIAGNOSIS — E039 Hypothyroidism, unspecified: Secondary | ICD-10-CM | POA: Diagnosis present

## 2022-09-05 DIAGNOSIS — N400 Enlarged prostate without lower urinary tract symptoms: Secondary | ICD-10-CM | POA: Diagnosis present

## 2022-09-05 DIAGNOSIS — G4700 Insomnia, unspecified: Secondary | ICD-10-CM | POA: Diagnosis not present

## 2022-09-05 DIAGNOSIS — E785 Hyperlipidemia, unspecified: Secondary | ICD-10-CM | POA: Diagnosis not present

## 2022-09-05 DIAGNOSIS — R45851 Suicidal ideations: Secondary | ICD-10-CM | POA: Diagnosis not present

## 2022-09-05 DIAGNOSIS — Z87891 Personal history of nicotine dependence: Secondary | ICD-10-CM | POA: Diagnosis not present

## 2022-09-05 DIAGNOSIS — F22 Delusional disorders: Secondary | ICD-10-CM | POA: Insufficient documentation

## 2022-09-05 DIAGNOSIS — F32A Depression, unspecified: Secondary | ICD-10-CM | POA: Diagnosis not present

## 2022-09-05 DIAGNOSIS — F429 Obsessive-compulsive disorder, unspecified: Secondary | ICD-10-CM | POA: Diagnosis present

## 2022-09-05 DIAGNOSIS — K5900 Constipation, unspecified: Secondary | ICD-10-CM | POA: Diagnosis present

## 2022-09-05 DIAGNOSIS — F600 Paranoid personality disorder: Secondary | ICD-10-CM | POA: Diagnosis not present

## 2022-09-05 LAB — LIPID PANEL
Cholesterol: 126 mg/dL (ref 0–200)
HDL: 52 mg/dL (ref >40)
LDL Cholesterol: 69 mg/dL (ref 0–99)
Total CHOL/HDL Ratio: 2.4 RATIO
Triglycerides: 25 mg/dL (ref <150)
VLDL: 5 mg/dL (ref 0–40)

## 2022-09-05 LAB — COMPREHENSIVE METABOLIC PANEL
ALT: 23 U/L (ref 0–44)
AST: 22 U/L (ref 15–41)
Albumin: 3.9 g/dL (ref 3.5–5.0)
Alkaline Phosphatase: 86 U/L (ref 38–126)
Anion gap: 9 (ref 5–15)
BUN: 7 mg/dL (ref 6–20)
CO2: 26 mmol/L (ref 22–32)
Calcium: 9.3 mg/dL (ref 8.9–10.3)
Chloride: 101 mmol/L (ref 98–111)
Creatinine, Ser: 0.96 mg/dL (ref 0.61–1.24)
GFR, Estimated: >60 mL/min (ref >60)
Glucose, Bld: 117 mg/dL — ABNORMAL HIGH (ref 70–99)
Potassium: 3.5 mmol/L (ref 3.5–5.1)
Sodium: 136 mmol/L (ref 135–145)
Total Bilirubin: 0.7 mg/dL (ref 0.3–1.2)
Total Protein: 7.6 g/dL (ref 6.5–8.1)

## 2022-09-05 LAB — CBC WITH DIFFERENTIAL/PLATELET
Abs Immature Granulocytes: 0.02 10*3/uL (ref 0.00–0.07)
Basophils Absolute: 0 10*3/uL (ref 0.0–0.1)
Basophils Relative: 0 %
Eosinophils Absolute: 0 10*3/uL (ref 0.0–0.5)
Eosinophils Relative: 0 %
HCT: 42.5 % (ref 39.0–52.0)
Hemoglobin: 14 g/dL (ref 13.0–17.0)
Immature Granulocytes: 0 %
Lymphocytes Relative: 24 %
Lymphs Abs: 1.6 10*3/uL (ref 0.7–4.0)
MCH: 30.3 pg (ref 26.0–34.0)
MCHC: 32.9 g/dL (ref 30.0–36.0)
MCV: 92 fL (ref 80.0–100.0)
Monocytes Absolute: 0.8 10*3/uL (ref 0.1–1.0)
Monocytes Relative: 12 %
Neutro Abs: 4.1 10*3/uL (ref 1.7–7.7)
Neutrophils Relative %: 64 %
Platelets: 236 10*3/uL (ref 150–400)
RBC: 4.62 MIL/uL (ref 4.22–5.81)
RDW: 12.9 % (ref 11.5–15.5)
WBC: 6.5 10*3/uL (ref 4.0–10.5)
nRBC: 0 % (ref 0.0–0.2)

## 2022-09-05 LAB — POCT URINE DRUG SCREEN - MANUAL ENTRY (I-SCREEN)
POC Amphetamine UR: NOT DETECTED
POC Buprenorphine (BUP): NOT DETECTED
POC Cocaine UR: NOT DETECTED
POC Marijuana UR: NOT DETECTED
POC Methadone UR: NOT DETECTED
POC Methamphetamine UR: NOT DETECTED
POC Morphine: NOT DETECTED
POC Oxazepam (BZO): NOT DETECTED
POC Oxycodone UR: NOT DETECTED
POC Secobarbital (BAR): NOT DETECTED

## 2022-09-05 LAB — ETHANOL: Alcohol, Ethyl (B): <10 mg/dL (ref <10)

## 2022-09-05 LAB — SARS CORONAVIRUS 2 BY RT PCR: SARS Coronavirus 2 by RT PCR: NEGATIVE

## 2022-09-05 LAB — TSH: TSH: 1.212 u[IU]/mL (ref 0.350–4.500)

## 2022-09-05 MED ORDER — ALBUTEROL SULFATE HFA 108 (90 BASE) MCG/ACT IN AERS: 1.0000 | INHALATION_SPRAY | RESPIRATORY_TRACT | Status: DC | PRN

## 2022-09-05 MED ORDER — OLANZAPINE 5 MG PO TBDP
5.0000 mg | ORAL_TABLET | Freq: Every day | ORAL | Status: DC
  Administered 2022-09-05: 5 mg via ORAL
  Filled 2022-09-05: qty 1

## 2022-09-05 MED ORDER — HYDROXYZINE HCL 25 MG PO TABS
25.0000 mg | ORAL_TABLET | Freq: Three times a day (TID) | ORAL | Status: DC | PRN
  Administered 2022-09-05: 25 mg via ORAL
  Filled 2022-09-05: qty 1

## 2022-09-05 MED ORDER — MAGNESIUM HYDROXIDE 400 MG/5ML PO SUSP: 30.0000 mL | Freq: Every day | ORAL | Status: DC | PRN

## 2022-09-05 MED ORDER — ACETAMINOPHEN 325 MG PO TABS: 650.0000 mg | ORAL_TABLET | Freq: Four times a day (QID) | ORAL | Status: DC | PRN

## 2022-09-05 MED ORDER — ALUM & MAG HYDROXIDE-SIMETH 200-200-20 MG/5ML PO SUSP: 30.0000 mL | ORAL | Status: DC | PRN

## 2022-09-05 MED ORDER — MONTELUKAST SODIUM 10 MG PO TABS
10.0000 mg | ORAL_TABLET | Freq: Every day | ORAL | Status: DC
  Administered 2022-09-05: 10 mg via ORAL
  Filled 2022-09-05: qty 1

## 2022-09-05 MED ORDER — ATORVASTATIN CALCIUM 10 MG PO TABS
20.0000 mg | ORAL_TABLET | Freq: Every day | ORAL | Status: DC
  Administered 2022-09-05: 20 mg via ORAL
  Filled 2022-09-05: qty 2

## 2022-09-05 MED ORDER — LORATADINE 10 MG PO TABS: 10.0000 mg | ORAL_TABLET | Freq: Every day | ORAL | Status: DC

## 2022-09-05 MED ORDER — TRAZODONE HCL 50 MG PO TABS: 50.0000 mg | ORAL_TABLET | Freq: Every evening | ORAL | Status: DC | PRN

## 2022-09-05 MED ORDER — MOMETASONE FURO-FORMOTEROL FUM 100-5 MCG/ACT IN AERO
2.0000 | INHALATION_SPRAY | Freq: Two times a day (BID) | RESPIRATORY_TRACT | Status: DC
  Administered 2022-09-05: 2 via RESPIRATORY_TRACT
  Filled 2022-09-05: qty 8.8

## 2022-09-05 MED ORDER — LEVOTHYROXINE SODIUM 100 MCG PO TABS: 100.0000 ug | ORAL_TABLET | Freq: Every day | ORAL | Status: DC

## 2022-09-05 NOTE — ED Notes (Signed)
Report given to [facility] RN@[facility]  adult unit

## 2022-09-05 NOTE — Progress Notes (Signed)
Pt was accepted to [facility] TODAY7/01/2023; Bed Assignment 502-1  -Signed vol consent has been faxed.  DX: psychosis; MDD   Pt meets inpatient criteria per Loreen Freud  Attending Physician will be Dr. Phineas Inches, MD   Report can be called to: - Adult unit: [phone]  Pt can arrive after: [facility] will coordinate with care team.  Care Team notified: [facility] Zachary George, [place], RN, [place],NP, [place] Coker,LPN, Latricia London,RN, [place]ne [place],NP, Patrice White,NP, [facility] [place], [facility] [place], [place] 09/05/2022 @ 9:59 PM

## 2022-09-05 NOTE — ED Notes (Signed)
Patient reported he has not slept in 24 hours. Patient reported he was extremely tired. Writer administered Vistaril 25 mg to patient.
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
## 2022-09-05 NOTE — ED Notes (Signed)
Pt awake calm and cooperative watching television no pain or distress pt denies SI, HI pt able to contract for safety will continue to monitor for safety
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
## 2022-09-05 NOTE — ED Notes (Signed)
Patient admitted to [facility] for further evaluation and will be recommended for inpatient admission.  Patient appeared anxious and was given a vistaril earlier with good results.  He was cooperative with admission process and accepted reassurance.  Patient also given lunch earlier and dinner a short while ago.  Patient has been resting in bed however he does make needs known to staff.  Will continue to monitor and provide supportive environment.

## 2022-09-05 NOTE — ED Notes (Signed)
Safe transport called to transport to [facility]

## 2022-09-05 NOTE — ED Notes (Signed)
Voluntary Form FAxed.

## 2022-09-06 ENCOUNTER — Other Ambulatory Visit: Payer: Self-pay

## 2022-09-06 ENCOUNTER — Encounter (HOSPITAL_COMMUNITY): Payer: Self-pay | Admitting: Behavioral Health

## 2022-09-06 DIAGNOSIS — F23 Brief psychotic disorder: Secondary | ICD-10-CM

## 2022-09-06 DIAGNOSIS — F29 Unspecified psychosis not due to a substance or known physiological condition: Secondary | ICD-10-CM | POA: Diagnosis present

## 2022-09-06 LAB — HEMOGLOBIN A1C
Hgb A1c MFr Bld: 6.1 % — ABNORMAL HIGH (ref 4.8–5.6)
Mean Plasma Glucose: 128 mg/dL

## 2022-09-06 MED ORDER — FLUOXETINE HCL 10 MG PO CAPS
10.0000 mg | ORAL_CAPSULE | Freq: Every day | ORAL | Status: DC
  Administered 2022-09-06 – 2022-09-08 (×3): 10 mg via ORAL
  Filled 2022-09-06 (×7): qty 1

## 2022-09-06 MED ORDER — HALOPERIDOL 5 MG PO TABS: 5.0000 mg | ORAL_TABLET | Freq: Three times a day (TID) | ORAL | Status: DC | PRN

## 2022-09-06 MED ORDER — ACETAMINOPHEN 325 MG PO TABS: 650.0000 mg | ORAL_TABLET | Freq: Four times a day (QID) | ORAL | Status: DC | PRN

## 2022-09-06 MED ORDER — ENSURE ENLIVE PO LIQD
237.0000 mL | Freq: Two times a day (BID) | ORAL | Status: DC
  Administered 2022-09-06: 237 mL via ORAL
  Filled 2022-09-06 (×26): qty 237

## 2022-09-06 MED ORDER — HALOPERIDOL LACTATE 5 MG/ML IJ SOLN: 5.0000 mg | Freq: Three times a day (TID) | INTRAMUSCULAR | Status: DC | PRN

## 2022-09-06 MED ORDER — ALUM & MAG HYDROXIDE-SIMETH 200-200-20 MG/5ML PO SUSP
30.0000 mL | ORAL | Status: DC | PRN
  Administered 2022-09-09: 30 mL via ORAL
  Filled 2022-09-06: qty 30

## 2022-09-06 MED ORDER — LORAZEPAM 2 MG/ML IJ SOLN: 2.0000 mg | Freq: Three times a day (TID) | INTRAMUSCULAR | Status: DC | PRN

## 2022-09-06 MED ORDER — MAGNESIUM HYDROXIDE 400 MG/5ML PO SUSP: 30.0000 mL | Freq: Every day | ORAL | Status: DC | PRN

## 2022-09-06 MED ORDER — VITAMIN D3 25 MCG PO TABS
1000.0000 [IU] | ORAL_TABLET | Freq: Every day | ORAL | Status: DC
  Administered 2022-09-06 – 2022-09-18 (×13): 1000 [IU] via ORAL
  Filled 2022-09-06 (×16): qty 1

## 2022-09-06 MED ORDER — ARIPIPRAZOLE 5 MG PO TABS
5.0000 mg | ORAL_TABLET | Freq: Every day | ORAL | Status: DC
  Administered 2022-09-06 – 2022-09-08 (×3): 5 mg via ORAL
  Filled 2022-09-06 (×7): qty 1

## 2022-09-06 MED ORDER — OMEGA-3-ACID ETHYL ESTERS 1 G PO CAPS
1.0000 g | ORAL_CAPSULE | Freq: Every day | ORAL | Status: DC
  Administered 2022-09-06 – 2022-09-18 (×13): 1 g via ORAL
  Filled 2022-09-06 (×16): qty 1

## 2022-09-06 MED ORDER — TRAZODONE HCL 50 MG PO TABS
50.0000 mg | ORAL_TABLET | Freq: Every day | ORAL | Status: DC
  Administered 2022-09-06: 50 mg via ORAL
  Filled 2022-09-06 (×4): qty 1

## 2022-09-06 MED ORDER — LEVOTHYROXINE SODIUM 100 MCG PO TABS
100.0000 ug | ORAL_TABLET | Freq: Every day | ORAL | Status: DC
  Administered 2022-09-06 – 2022-09-18 (×13): 100 ug via ORAL
  Filled 2022-09-06 (×16): qty 1

## 2022-09-06 MED ORDER — HYDROXYZINE HCL 25 MG PO TABS
25.0000 mg | ORAL_TABLET | Freq: Three times a day (TID) | ORAL | Status: DC | PRN
  Administered 2022-09-06 – 2022-09-09 (×3): 25 mg via ORAL
  Filled 2022-09-06 (×3): qty 1

## 2022-09-06 MED ORDER — DIPHENHYDRAMINE HCL 25 MG PO CAPS: 50.0000 mg | ORAL_CAPSULE | Freq: Three times a day (TID) | ORAL | Status: DC | PRN

## 2022-09-06 MED ORDER — ALBUTEROL SULFATE HFA 108 (90 BASE) MCG/ACT IN AERS: 1.0000 | INHALATION_SPRAY | RESPIRATORY_TRACT | Status: DC | PRN

## 2022-09-06 MED ORDER — LORAZEPAM 1 MG PO TABS: 2.0000 mg | ORAL_TABLET | Freq: Three times a day (TID) | ORAL | Status: DC | PRN

## 2022-09-06 MED ORDER — MOMETASONE FURO-FORMOTEROL FUM 100-5 MCG/ACT IN AERO
2.0000 | INHALATION_SPRAY | Freq: Two times a day (BID) | RESPIRATORY_TRACT | Status: DC
  Administered 2022-09-06 – 2022-09-18 (×25): 2 via RESPIRATORY_TRACT
  Filled 2022-09-06 (×2): qty 8.8

## 2022-09-06 MED ORDER — DIPHENHYDRAMINE HCL 50 MG/ML IJ SOLN: 50.0000 mg | Freq: Three times a day (TID) | INTRAMUSCULAR | Status: DC | PRN

## 2022-09-06 MED ORDER — TRAZODONE HCL 50 MG PO TABS
50.0000 mg | ORAL_TABLET | Freq: Every evening | ORAL | Status: DC | PRN
  Administered 2022-09-06: 50 mg via ORAL
  Filled 2022-09-06: qty 1

## 2022-09-06 MED ORDER — LORATADINE 10 MG PO TABS
10.0000 mg | ORAL_TABLET | Freq: Every day | ORAL | Status: DC
  Administered 2022-09-06 – 2022-09-15 (×10): 10 mg via ORAL
  Filled 2022-09-06 (×13): qty 1

## 2022-09-06 MED ORDER — ATORVASTATIN CALCIUM 20 MG PO TABS
20.0000 mg | ORAL_TABLET | Freq: Every day | ORAL | Status: DC
  Administered 2022-09-06 – 2022-09-18 (×13): 20 mg via ORAL
  Filled 2022-09-06: qty 1
  Filled 2022-09-06: qty 2
  Filled 2022-09-06 (×6): qty 1
  Filled 2022-09-06: qty 2
  Filled 2022-09-06: qty 1
  Filled 2022-09-06: qty 2
  Filled 2022-09-06 (×6): qty 1

## 2022-09-06 NOTE — Group Note (Signed)
Date:  09/06/2022 Time:  9:12 PM  Group Topic/Focus:  Wrap-Up Group:   The focus of this group is to help patients review their daily goal of treatment and discuss progress on daily workbooks.  The focus of this wrap-up group was to help patients get consistent with setting daily goals and discuss the patients familiarity with coping skills that are most helpful in their recovery.   Participation Level:  Active  Participation Quality:  Appropriate  Affect:  Appropriate  Cognitive:  Alert  Insight: Good  Engagement in Group:  Engaged  Modes of Intervention:  Discussion  Additional Comments:    Osker Mason 09/06/2022, 9:12 PM

## 2022-09-06 NOTE — [facility] Suicide Risk Assessment (Addendum)
Suicide Risk Assessment  Admission Assessment    [facility] Admission Suicide Risk Assessment   Nursing information obtained from:  Patient  Demographic factors:  Male, Access to firearms, Unemployed  Current Mental Status: Alert, oriented & aware of situation, however, reports feeling of paranoia with delusional thoughts.  Loss Factors:  NA  Historical Factors:  Family history of mental illness or substance abuse  Risk Reduction Factors:  Sense of responsibility to family, Religious beliefs about death, Living with another person, especially a relative, Positive social support  Total Time spent with patient: 1 hour  Principal Problem: Brief psychotic disorder (HCC)  Diagnosis:  Principal Problem:   Brief psychotic disorder (HCC) Active Problems:   Psychosis, unspecified psychosis type (HCC)  Subjective Data: See H&P.  Continued Clinical Symptoms:  Alcohol Use Disorder Identification Test Final Score (AUDIT): 3 The "Alcohol Use Disorders Identification Test", Guidelines for Use in Primary Care, Second Edition.  World Science writer [facility]). Score between 0-7:  no or low risk or alcohol related problems. Score between 8-15:  moderate risk of alcohol related problems. Score between 16-19:  high risk of alcohol related problems. Score 20 or above:  warrants further diagnostic evaluation for alcohol dependence and treatment.  CLINICAL FACTORS:   Depression:   Insomnia Currently Psychotic Medical Diagnoses and Treatments/Surgeries  Musculoskeletal: Strength & Muscle Tone: within normal limits Gait & Station: normal Patient leans: N/A  Psychiatric Specialty Exam:  Presentation  General Appearance:  Casual; Appropriate for Environment; Fairly Groomed  Eye Contact: Good  Speech: Clear and Coherent; Normal Rate  Speech Volume: Normal  Handedness: Right   Mood and Affect  Mood: Anxious  Affect: Congruent  Thought Process  Thought  Processes: Disorganized  Descriptions of Associations:Tangential  Orientation:Full (Time, Place and Person)  Thought Content:Delusions; Tangential; Paranoid Ideation  History of Schizophrenia/Schizoaffective disorder:No  Duration of Psychotic Symptoms:N/A  Hallucinations:Hallucinations: None  Ideas of Reference:None  Suicidal Thoughts:Suicidal Thoughts: No  Homicidal Thoughts:Homicidal Thoughts: No  Sensorium  Memory: Immediate Fair; Recent Fair; Remote Fair  Judgment: Impaired  Insight: Present  Executive Functions  Concentration: Good  Attention Span: Good  Recall: Fair  Fund of Knowledge: Poor  Language: Good  Psychomotor Activity  Psychomotor Activity: Psychomotor Activity: Normal  Assets  Assets: Communication Skills; Desire for Improvement; Financial Resources/Insurance; Housing; Physical Health; Resilience; Social Support  Sleep  Sleep: Sleep: Fair Number of Hours of Sleep: 6  Physical Exam: See H&P. Blood pressure 119/80, pulse 68, temperature 98.3 F (36.8 C), temperature source Oral, resp. rate (!) 24, height 5\' 10"  (1.778 m), weight 76.6 kg, SpO2 99%. Body mass index is 24.22 kg/m.  COGNITIVE FEATURES THAT CONTRIBUTE TO RISK:  Loss of executive function    SUICIDE RISK:   Moderate:  Frequent suicidal ideation with limited intensity, and duration, some specificity in terms of plans, no associated intent, good self-control, limited dysphoria/symptomatology, some risk factors present, and identifiable protective factors, including available and accessible social support.  PLAN OF CARE: See H&P.  I certify that inpatient services furnished can reasonably be expected to improve the patient's condition.   Armandina Stammer, NP, pmhnp, fnp-bc. 09/06/2022, 11:46 AM

## 2022-09-06 NOTE — H&P (Signed)
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 7/13/20241:04 PM

## 2022-09-06 NOTE — Progress Notes (Signed)
   09/06/22 1053  Psych Admission Type (Psych Patients Only)  Admission Status Voluntary  Psychosocial Assessment  Patient Complaints Anxiety;Appetite decrease  Eye Contact Fair  Facial Expression Flat;Sad  Affect Flat  Speech Logical/coherent  Interaction Cautious;Guarded  Motor Activity Slow  Appearance/Hygiene Unremarkable  Behavior Characteristics Cooperative;Appropriate to situation  Mood Depressed;Sad  Thought Process  Coherency WDL  Content Paranoia  Delusions Paranoid  Perception Hallucinations  Hallucination Auditory  Judgment Impaired  Confusion WDL  Danger to Self  Current suicidal ideation? Denies  Agreement Not to Harm Self Yes  Description of Agreement verbal  Danger to Others  Danger to Others None reported or observed

## 2022-09-06 NOTE — Plan of Care (Signed)
  Problem: Education: Goal: Knowledge of [facility] Education information/materials will improve Outcome: Progressing Goal: Emotional status will improve Outcome: Progressing Goal: Verbalization of understanding the information provided will improve Outcome: Progressing   

## 2022-09-06 NOTE — Progress Notes (Signed)
   09/05/22 2355  Psych Admission Type (Psych Patients Only)  Admission Status Voluntary  Psychosocial Assessment  Patient Complaints Anxiety;Appetite decrease;Depression;Insomnia;Self-harm thoughts  Eye Contact Fair  Facial Expression Flat;Sad  Affect Depressed  Speech Logical/coherent  Interaction Cautious  Motor Activity Slow  Appearance/Hygiene Unremarkable  Behavior Characteristics Cooperative;Appropriate to situation  Mood Depressed;Sad  Thought Process  Coherency WDL  Content Preoccupation;Paranoia  Delusions Paranoid  Perception Hallucinations  Hallucination Auditory;Visual  Judgment Impaired  Confusion WDL  Danger to Self  Current suicidal ideation? Passive  Agreement Not to Harm Self Yes  Description of Agreement verbal  Danger to Others  Danger to Others None reported or observed

## 2022-09-06 NOTE — Plan of Care (Signed)
  Problem: Education: Goal: Knowledge of [facility] Education information/materials will improve Outcome: Progressing Goal: Verbalization of understanding the information provided will improve Outcome: Progressing   

## 2022-09-06 NOTE — Tx Team (Signed)
Initial Treatment Plan 09/06/2022 1:24 AM Stormy Card [id number]    PATIENT STRESSORS: Other: "Paranoia"     PATIENT STRENGTHS: Ability for insight  Average or above average intelligence  Capable of independent living  Education administrator  Motivation for treatment/growth  Religious Affiliation  Supportive family/friends    PATIENT IDENTIFIED PROBLEMS: Suicidal thoughts  Paranoia  Hallucinations      "Help with calming down"  "Help getting diagnosed"         DISCHARGE CRITERIA:  Ability to meet basic life and health needs Adequate post-discharge living arrangements Improved stabilization in mood, thinking, and/or behavior Motivation to continue treatment in a less acute level of care Reduction of life-threatening or endangering symptoms to within safe limits  PRELIMINARY DISCHARGE PLAN: Outpatient therapy Participate in family therapy Return to previous living arrangement  PATIENT/FAMILY INVOLVEMENT: This treatment plan has been presented to and reviewed with the patient, Don Hurst.  The patient and family have been given the opportunity to ask questions and make suggestions.  Marcie Bal, RN 09/06/2022, 1:24 AM

## 2022-09-06 NOTE — Progress Notes (Signed)
   09/06/22 0601  15 Minute Checks  Location Bedroom  Visual Appearance Calm  Behavior Sleeping  Sleep (Behavioral Health Patients Only)  Calculate sleep? (Click Yes once per 24 hr at 0600 safety check) Yes  Documented sleep last 24 hours 5.25
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
## 2022-09-07 DIAGNOSIS — F23 Brief psychotic disorder: Secondary | ICD-10-CM | POA: Diagnosis not present

## 2022-09-07 MED ORDER — TRAZODONE HCL 100 MG PO TABS
100.0000 mg | ORAL_TABLET | Freq: Every day | ORAL | Status: DC
  Administered 2022-09-08 – 2022-09-09 (×2): 100 mg via ORAL
  Filled 2022-09-07 (×5): qty 1

## 2022-09-07 NOTE — BHH Group Notes (Signed)
Adult Psychoeducational Group Note  Date:  09/07/2022 Time:  6:17 PM  Group Topic/Focus:  Goals Group:   The focus of this group is to help patients establish daily goals to achieve during treatment and discuss how the patient can incorporate goal setting into their daily lives to aide in recovery. Orientation:   The focus of this group is to educate the patient on the purpose and policies of crisis stabilization and provide a format to answer questions about their admission.  The group details unit policies and expectations of patients while admitted.  Participation Level:  Active  Participation Quality:  Appropriate  Affect:  Appropriate  Cognitive:  Appropriate  Insight: Appropriate  Engagement in Group:  Engaged  Modes of Intervention:  Discussion  Additional Comments:  Pt attended the goals group and remained appropriate and engaged throughout the duration of the group.   Sheran Lawless 09/07/2022, 6:17 PM

## 2022-09-07 NOTE — Group Note (Signed)
Date:  09/07/2022 Time:  9:03 PM  Group Topic/Focus:  Wrap-Up Group:   The focus of this group is to help patients review their daily goal of treatment and discuss progress on daily workbooks.    Participation Level:  Active  Participation Quality:  Appropriate  Affect:  Appropriate  Cognitive:  Appropriate  Insight: Appropriate  Engagement in Group:  Engaged  Modes of Intervention:  Education and Exploration  Additional Comments:  Patient attended and participated in group tonight. He reports that today he got some sleep because he did not sleep last night.  Lita Mains [facility] 09/07/2022, 9:03 PM
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
## 2022-09-07 NOTE — Progress Notes (Signed)
[facility] MD Progress Note  09/07/2022 8:52 AM Don Hurst  MRN:  [id number]  Reason for admission: 62 year old AA male with no known hx of psychiatric hospitalizations, diagnoses or treatments until now. There were no hx of substance use. Patient is admitted to the [facility] from the [facility] with complaint of new unset of psychosis that comprises of paranoia, delusional thoughts/hallucinations. After the [facility] initial evaluation, patient was transferred to the [facility] for further psychiatric evaluation/treatments. A review of his current lab results has shown an elevated hgba1c 6.1 & toxicology/UDS results were negative of all illegal substances.   Daily: Travone is seen. Chart reviewed. The chart findings discussed with the treatment team. He presents alert, oriented & aware of situation. He is visible on the unit, attending group sessions. He reports this morning, "I'm doing so-so. This is because I did not sleep well last night. The reason is because, the staff keep opening door. Once I dosed off to sleep, here they come again. I feel tired this morning. I'm not in a good mood as a result. My depression today is #7 & anxiety 6.5. I just feel restless. I have got to sleep some tonight". Don Hurst is instructed & informed that the staff will be instructed to be careful when opening the doors to check on the patients because the doors do make some noise when opened. He is instructed to try to catch some nap this afternoon when there is no group sessions or other activities in progress. Patient currently denies any SIHI, AVH, delusional thoughts or paranoia. He does not appear to be responding to any internal stimuli. His Trazodone dose has been adjusted to help him sleep better tonight. Reviewed vital signs. The blood pressure & pulse are elevated. Patient is currently in no apparent distress. Staff will recheck vital signs. Continue current plan of care as already in progress.  Principal Problem: Brief psychotic disorder  (HCC)  Diagnosis: Principal Problem:   Brief psychotic disorder (HCC) Active Problems:   Psychosis, unspecified psychosis type (HCC)  Total Time spent with patient:  35 minutes  Past Psychiatric History: See H&P.  Past Medical History:  Past Medical History:  Diagnosis Date   Allergy    Arthritis    Asthma    GERD (gastroesophageal reflux disease)    History of chronic bronchitis    Multiple allergies    Wears glasses     Past Surgical History:  Procedure Laterality Date   COLONOSCOPY     INGUINAL HERNIA REPAIR  02/2004   rt   INGUINAL HERNIA REPAIR Left 02/06/2016   Procedure: LAPAROSCOPIC LEFT INGUINAL HERNIA REPAIR;  Surgeon: Berna Bue, MD;  Location: [facility];  Service: General;  Laterality: Left;   INSERTION OF MESH Left 02/06/2016   Procedure: INSERTION OF MESH;  Surgeon: Berna Bue, MD;  Location: [facility];  Service: General;  Laterality: Left;   PAROTIDECTOMY Left 02/08/2014   Procedure: LEFT SUPERFICIAL PAROTIDECTOMY;  Surgeon: Flo Shanks, MD;  Location: [facility];  Service: ENT;  Laterality: Left;   Family History:  Family History  Problem Relation Age of Onset   Hypertension Mother        per pt dx around 33 y o   Cancer Father 80       colon   Eczema Daughter    Hypertension Sister    Family Psychiatric  History: See H&P.  Social History:  Social History   Substance and Sexual Activity  Alcohol Use No  Social History   Substance and Sexual Activity  Drug Use No    Social History   Socioeconomic History   Marital status: Married    Spouse name: Not on file   Number of children: Not on file   Years of education: Not on file   Highest education level: Not on file  Occupational History   Not on file  Tobacco Use   Smoking status: Former    Current packs/day: 0.00    Average packs/day: 0.3 packs/day for 3.0 years (0.8 ttl pk-yrs)    Types: Cigarettes    Start date: 03/25/1988    Quit date: 03/26/1991    Years  since quitting: 31.4   Smokeless tobacco: Never   Tobacco comments:    smoked 1 pk every 2 wks  Vaping Use   Vaping status: Never Used  Substance and Sexual Activity   Alcohol use: No   Drug use: No   Sexual activity: Yes    Comment: number of sex partners in the last 12 months  1  Other Topics Concern   Not on file  Social History Narrative   Exercise doing weights 4 times per week for 30 minutes   Social Determinants of Health   Financial Resource Strain: Not on file  Food Insecurity: No Food Insecurity (09/05/2022)   Hunger Vital Sign    Worried About Running Out of Food in the Last Year: Never true    Ran Out of Food in the Last Year: Never true  Transportation Needs: No Transportation Needs (09/05/2022)   PRAPARE - Administrator, Civil Service (Medical): No    Lack of Transportation (Non-Medical): No  Physical Activity: Not on file  Stress: Not on file  Social Connections: Not on file   Additional Social History:   Sleep: Poor  Appetite:  Good  Current Medications: Current Facility-Administered Medications  Medication Dose Route Frequency Provider Last Rate Last Admin   acetaminophen (TYLENOL) tablet 650 mg  650 mg Oral Q6H PRN Bobbitt, Shalon E, NP       albuterol (VENTOLIN HFA) 108 (90 Base) MCG/ACT inhaler 1-2 puff  1-2 puff Inhalation Q4H PRN Essex Perry I, NP       alum & mag hydroxide-simeth (MAALOX/MYLANTA) 200-200-20 MG/5ML suspension 30 mL  30 mL Oral Q4H PRN Bobbitt, Shalon E, NP       ARIPiprazole (ABILIFY) tablet 5 mg  5 mg Oral Daily Tyshea Imel I, NP   5 mg at 09/07/22 0757   atorvastatin (LIPITOR) tablet 20 mg  20 mg Oral Daily Bence Trapp, Nicole Kindred I, NP   20 mg at 09/07/22 0757   diphenhydrAMINE (BENADRYL) capsule 50 mg  50 mg Oral TID PRN Bobbitt, Shalon E, NP       Or   diphenhydrAMINE (BENADRYL) injection 50 mg  50 mg Intramuscular TID PRN Bobbitt, Shalon E, NP       feeding supplement (ENSURE ENLIVE / ENSURE PLUS) liquid 237 mL  237 mL Oral  BID BM Massengill, Harrold Donath, MD   237 mL at 09/07/22 0803   FLUoxetine (PROZAC) capsule 10 mg  10 mg Oral Daily Kelie Gainey, Nicole Kindred I, NP   10 mg at 09/07/22 0757   haloperidol (HALDOL) tablet 5 mg  5 mg Oral TID PRN Bobbitt, Shalon E, NP       Or   haloperidol lactate (HALDOL) injection 5 mg  5 mg Intramuscular TID PRN Bobbitt, Shalon E, NP       hydrOXYzine (ATARAX) tablet  25 mg  25 mg Oral TID PRN Bobbitt, Shalon E, NP   25 mg at 09/06/22 0051   levothyroxine (SYNTHROID) tablet 100 mcg  100 mcg Oral Daily Armandina Stammer I, NP   100 mcg at 09/07/22 0802   loratadine (CLARITIN) tablet 10 mg  10 mg Oral Daily Armandina Stammer I, NP   10 mg at 09/07/22 0757   LORazepam (ATIVAN) tablet 2 mg  2 mg Oral TID PRN Bobbitt, Shalon E, NP       Or   LORazepam (ATIVAN) injection 2 mg  2 mg Intramuscular TID PRN Bobbitt, Shalon E, NP       magnesium hydroxide (MILK OF MAGNESIA) suspension 30 mL  30 mL Oral Daily PRN Bobbitt, Shalon E, NP       mometasone-formoterol (DULERA) 100-5 MCG/ACT inhaler 2 puff  2 puff Inhalation BID Armandina Stammer I, NP   2 puff at 09/07/22 0758   omega-3 acid ethyl esters (LOVAZA) capsule 1 g  1 g Oral Daily Myranda Pavone, Nicole Kindred I, NP   1 g at 09/07/22 0757   traZODone (DESYREL) tablet 50 mg  50 mg Oral QHS Armandina Stammer I, NP   50 mg at 09/06/22 2152   vitamin D3 (CHOLECALCIFEROL) tablet 1,000 Units  1,000 Units Oral Daily Armandina Stammer I, NP   1,000 Units at 09/07/22 0802   Lab Results:  Results for orders placed or performed during the hospital encounter of 09/05/22 (from the past 48 hour(s))  SARS Coronavirus 2 by RT PCR (hospital order, performed in [facility] hospital lab) *cepheid single result test* Anterior Nasal Swab     Status: None   Collection Time: 09/05/22  1:25 PM   Specimen: Anterior Nasal Swab  Result Value Ref Range   SARS Coronavirus 2 by RT PCR NEGATIVE NEGATIVE    Comment: Performed at [facility], [address] [place], [place] [address]  CBC with Differential/Platelet      Status: None   Collection Time: 09/05/22  1:25 PM  Result Value Ref Range   WBC 6.5 4.0 - 10.5 K/uL   RBC 4.62 4.22 - 5.81 MIL/uL   Hemoglobin 14.0 13.0 - 17.0 g/dL   HCT 24.4 01.0 - 27.2 %   MCV 92.0 80.0 - 100.0 fL   MCH 30.3 26.0 - 34.0 pg   MCHC 32.9 30.0 - 36.0 g/dL   RDW 53.6 64.4 - 03.4 %   Platelets 236 150 - 400 K/uL   nRBC 0.0 0.0 - 0.2 %   Neutrophils Relative % 64 %   Neutro Abs 4.1 1.7 - 7.7 K/uL   Lymphocytes Relative 24 %   Lymphs Abs 1.6 0.7 - 4.0 K/uL   Monocytes Relative 12 %   Monocytes Absolute 0.8 0.1 - 1.0 K/uL   Eosinophils Relative 0 %   Eosinophils Absolute 0.0 0.0 - 0.5 K/uL   Basophils Relative 0 %   Basophils Absolute 0.0 0.0 - 0.1 K/uL   Immature Granulocytes 0 %   Abs Immature Granulocytes 0.02 0.00 - 0.07 K/uL    Comment: Performed at [facility], [address], [place], [place] [address]  Comprehensive metabolic panel     Status: Abnormal   Collection Time: 09/05/22  1:25 PM  Result Value Ref Range   Sodium 136 135 - 145 mmol/L   Potassium 3.5 3.5 - 5.1 mmol/L   Chloride 101 98 - 111 mmol/L   CO2 26 22 - 32 mmol/L   Glucose, Bld  117 (H) 70 - 99 mg/dL    Comment: Glucose reference range applies only to samples taken after fasting for at least 8 hours.   BUN 7 6 - 20 mg/dL   Creatinine, Ser 6.96 0.61 - 1.24 mg/dL   Calcium 9.3 8.9 - 29.5 mg/dL   Total Protein 7.6 6.5 - 8.1 g/dL   Albumin 3.9 3.5 - 5.0 g/dL   AST 22 15 - 41 U/L   ALT 23 0 - 44 U/L   Alkaline Phosphatase 86 38 - 126 U/L   Total Bilirubin 0.7 0.3 - 1.2 mg/dL   GFR, Estimated >28 >41 mL/min    Comment: (NOTE) Calculated using the CKD-EPI Creatinine Equation (2021)    Anion gap 9 5 - 15    Comment: Performed at [facility], [address], [place], [place] [address]  Hemoglobin A1c     Status: Abnormal   Collection Time: 09/05/22  1:25 PM  Result Value Ref Range   Hgb A1c MFr Bld 6.1 (H) 4.8 - 5.6 %    Comment: (NOTE)         Prediabetes: 5.7 - 6.4          Diabetes: >6.4         Glycemic control for adults with diabetes: <7.0    Mean Plasma Glucose 128 mg/dL    Comment: (NOTE) Performed At: [facility] [address] [place], [place] [id number] Jolene Schimke MD [id number]   Ethanol     Status: None   Collection Time: 09/05/22  1:25 PM  Result Value Ref Range   Alcohol, Ethyl (B) <10 <10 mg/dL    Comment: (NOTE) Lowest detectable limit for serum alcohol is 10 mg/dL.  For medical purposes only. Performed at [facility], [address], [place], [place] [address]   Lipid panel     Status: None   Collection Time: 09/05/22  1:25 PM  Result Value Ref Range   Cholesterol 126 0 - 200 mg/dL   Triglycerides 25 <433 mg/dL   HDL 52 >29 mg/dL   Total CHOL/HDL Ratio 2.4 RATIO   VLDL 5 0 - 40 mg/dL   LDL Cholesterol 69 0 - 99 mg/dL    Comment:        Total Cholesterol/HDL:CHD Risk Coronary Heart Disease Risk Table                     Men   Women  1/2 Average Risk   3.4   3.3  Average Risk       5.0   4.4  2 X Average Risk   9.6   7.1  3 X Average Risk  23.4   11.0        Use the calculated Patient Ratio above and the CHD Risk Table to determine the patient's CHD Risk.        ATP III CLASSIFICATION (LDL):  <100     mg/dL   Optimal  518-841  mg/dL   Near or Above                    Optimal  130-159  mg/dL   Borderline  660-630  mg/dL   High  >160     mg/dL   Very High Performed at [facility], [address], [place], [place] [address]   TSH     Status: None   Collection Time: 09/05/22  1:25 PM  Result Value Ref  Range   TSH 1.212 0.350 - 4.500 uIU/mL    Comment: Performed by a 3rd Generation assay with a functional sensitivity of <=0.01 uIU/mL. Performed at [facility], [address] [place], [place] [address]   POCT Urine Drug Screen - (I-Screen)     Status: Normal   Collection Time: 09/05/22  1:30 PM  Result Value Ref Range   POC Amphetamine UR None Detected NONE DETECTED (Cut Off  Level 1000 ng/mL)   POC Secobarbital (BAR) None Detected NONE DETECTED (Cut Off Level 300 ng/mL)   POC Buprenorphine (BUP) None Detected NONE DETECTED (Cut Off Level 10 ng/mL)   POC Oxazepam (BZO) None Detected NONE DETECTED (Cut Off Level 300 ng/mL)   POC Cocaine UR None Detected NONE DETECTED (Cut Off Level 300 ng/mL)   POC Methamphetamine UR None Detected NONE DETECTED (Cut Off Level 1000 ng/mL)   POC Morphine None Detected NONE DETECTED (Cut Off Level 300 ng/mL)   POC Methadone UR None Detected NONE DETECTED (Cut Off Level 300 ng/mL)   POC Oxycodone UR None Detected NONE DETECTED (Cut Off Level 100 ng/mL)   POC Marijuana UR None Detected NONE DETECTED (Cut Off Level 50 ng/mL)   Blood Alcohol level:  Lab Results  Component Value Date   ETH <10 09/05/2022   Metabolic Disorder Labs: Lab Results  Component Value Date   HGBA1C 6.1 (H) 09/05/2022   MPG 128 09/05/2022   No results found for: "PROLACTIN" Lab Results  Component Value Date   CHOL 126 09/05/2022   TRIG 25 09/05/2022   HDL 52 09/05/2022   CHOLHDL 2.4 09/05/2022   VLDL 5 09/05/2022   LDLCALC 69 09/05/2022   LDLCALC 85 06/17/2022   Physical Findings: AIMS:  , ,  ,  ,    CIWA:    COWS:     Musculoskeletal: Strength & Muscle Tone: within normal limits Gait & Station: normal Patient leans: N/A  Psychiatric Specialty Exam:  Presentation  General Appearance:  Casual; Appropriate for Environment; Fairly Groomed  Eye Contact: Good  Speech: Clear and Coherent; Normal Rate  Speech Volume: Normal  Handedness: Right   Mood and Affect  Mood: Anxious  Affect: Congruent  Thought Process  Thought Processes: Disorganized  Descriptions of Associations:Tangential  Orientation:Full (Time, Place and Person)  Thought Content:Delusions; Tangential; Paranoid Ideation  History of Schizophrenia/Schizoaffective disorder:No  Duration of Psychotic Symptoms:N/A  Hallucinations:Hallucinations: None  Ideas  of Reference:None  Suicidal Thoughts:Suicidal Thoughts: No  Homicidal Thoughts:Homicidal Thoughts: No   Sensorium  Memory: Immediate Fair; Recent Fair; Remote Fair  Judgment: Impaired  Insight: Present   Executive Functions  Concentration: Good  Attention Span: Good  Recall: Fair  Fund of Knowledge: Poor  Language: Good  Psychomotor Activity  Psychomotor Activity: Psychomotor Activity: Normal  Assets  Assets: Communication Skills; Desire for Improvement; Financial Resources/Insurance; Housing; Physical Health; Resilience; Social Support  Sleep  Sleep: Sleep: Fair Number of Hours of Sleep: 6  Physical Exam: Physical Exam Vitals and nursing note reviewed.  HENT:     Mouth/Throat:     Pharynx: Oropharynx is clear.  Pulmonary:     Effort: Pulmonary effort is normal.  Genitourinary:    Comments: Deferred Musculoskeletal:        General: Normal range of motion.     Cervical back: Normal range of motion.  Skin:    General: Skin is warm and dry.  Neurological:     General: No focal deficit present.     Mental Status: He  is alert and oriented to person, place, and time.    Review of Systems  Constitutional:  Negative for chills, diaphoresis and fever.  HENT:  Negative for congestion and sore throat.   Respiratory:  Negative for cough, shortness of breath and wheezing.   Cardiovascular:  Negative for chest pain and palpitations.  Gastrointestinal:  Negative for abdominal pain, constipation, diarrhea, heartburn, nausea and vomiting.  Genitourinary:  Negative for dysuria.  Musculoskeletal:  Negative for joint pain and myalgias.  Neurological:  Negative for dizziness, tingling, tremors, sensory change, speech change, focal weakness, seizures, loss of consciousness, weakness and headaches.  Psychiatric/Behavioral:  Positive for depression. Negative for hallucinations (HX of), memory loss, substance abuse and suicidal ideas. The patient is nervous/anxious  and has insomnia.    Blood pressure (!) 136/90, pulse (!) 108, temperature 98.5 F (36.9 C), temperature source Oral, resp. rate (!) 24, height 5\' 10"  (1.778 m), weight 76.6 kg, SpO2 100%. Body mass index is 24.22 kg/m.  Treatment Plan Summary: Daily contact with patient to assess and evaluate symptoms and progress in treatment and Medication management.   Continue inpatient hospitalization.  Will continue today 09/07/2022 plan as below except where it is noted.   Principal/active diagnoses.  Brief Psychotic disorder.    Associated symptoms.  Paranoid ideations.  Delusional thinking.  AVH.    Other medical issues.  Asthma. Hyperlipidemia.   Plan: The risks/benefits/side-effects/alternatives to the medications in use were discussed in detail with the patient and time was given for patient's questions. The patient consents to medication trial.    -Continue Abilify 5 mg po daily for psychosis.  -Continue Prozac 10 mg po daily for depression.  -Continue Hydroxyzine 25 mg po tid prn for anxiety.  -Increased Trazodone to 100 mg po Q hs prn for insomnia.    Agitation protocols: Cont as recommended;  -Benadryl 50 mg po or IM tid prn. -Haldol 5 mg po or IM tid prn.  -Lorazepam 2 mg po or IM tid prn.   Other medical conditions.  -Continue Albuterol inhaler 1-2 puffs Q 4 hrs prn for SOB.  -Continue Dulera 2 puffs bid for asthma. -Continue atorvastatin 20 mg po Q 1800 for hyperlipidemia.  -Continue Synthroid 100 mcg po daily for hypothyroidism.  -Continue Claritin 10 mg po daily for allergies.  -Continue Vit D3 1,000 units po daily for bone health.  -Continue Omega -3 acid 1 gm po daily for hyperlipidemia.   Other PRNS -Continue Tylenol 650 mg every 6 hours PRN for mild pain -Continue Maalox 30 ml Q 4 hrs PRN for indigestion -Continue MOM 30 ml po Q 6 hrs for constipation   Safety and Monitoring: Voluntary admission to inpatient psychiatric unit for safety, stabilization and  treatment Daily contact with patient to assess and evaluate symptoms and progress in treatment Patient's case to be discussed in multi-disciplinary team meeting Observation Level : q15 minute checks Vital signs: q12 hours Precautions: Safety   Discharge Planning: Social work and case management to assist with discharge planning and identification of hospital follow-up needs prior to discharge Estimated LOS: 5-7 days Discharge Concerns: Need to establish a safety plan; Medication compliance and effectiveness Discharge Goals: Return home with outpatient referrals for mental health follow-up including medication management/psychotherapy  Armandina Stammer, NP, pmhnp, fnp-bc.  09/07/2022, 8:52 AM

## 2022-09-07 NOTE — Progress Notes (Signed)
   09/07/22 0000  Psych Admission Type (Psych Patients Only)  Admission Status Voluntary  Psychosocial Assessment  Patient Complaints Anxiety;Appetite decrease  Eye Contact Fair  Facial Expression Flat;Sad  Affect Flat  Speech Logical/coherent  Interaction Cautious;Guarded  Motor Activity Slow  Appearance/Hygiene Unremarkable  Behavior Characteristics Cooperative;Appropriate to situation  Mood Depressed;Sad  Thought Process  Coherency WDL  Content Paranoia  Delusions Paranoid  Perception Hallucinations  Hallucination Auditory  Judgment Impaired  Confusion WDL  Danger to Self  Current suicidal ideation? Denies  Agreement Not to Harm Self Yes  Description of Agreement verbal  Danger to Others  Danger to Others None reported or observed

## 2022-09-07 NOTE — Plan of Care (Signed)
  Problem: Education: Goal: Knowledge of [facility] Education information/materials will improve Outcome: Progressing Goal: Emotional status will improve Outcome: Progressing Goal: Mental status will improve Outcome: Progressing Goal: Verbalization of understanding the information provided will improve Outcome: Progressing   Problem: Activity: Goal: Interest or engagement in activities will improve Outcome: Progressing Goal: Sleeping patterns will improve Outcome: Progressing   Problem: Coping: Goal: Ability to verbalize frustrations and anger appropriately will improve Outcome: Progressing Goal: Ability to demonstrate self-control will improve Outcome: Progressing   Problem: Health Behavior/Discharge Planning: Goal: Identification of resources available to assist in meeting health care needs will improve Outcome: Progressing Goal: Compliance with treatment plan for underlying cause of condition will improve Outcome: Progressing   Problem: Physical Regulation: Goal: Ability to maintain clinical measurements within normal limits will improve Outcome: Progressing   Problem: Safety: Goal: Periods of time without injury will increase Outcome: Progressing   Problem: Activity: Goal: Will verbalize the importance of balancing activity with adequate rest periods Outcome: Progressing   Problem: Education: Goal: Will be free of psychotic symptoms Outcome: Progressing Goal: Knowledge of the prescribed therapeutic regimen will improve Outcome: Progressing   Problem: Coping: Goal: Coping ability will improve Outcome: Progressing Goal: Will verbalize feelings Outcome: Progressing   Problem: Health Behavior/Discharge Planning: Goal: Compliance with prescribed medication regimen will improve Outcome: Progressing   Problem: Nutritional: Goal: Ability to achieve adequate nutritional intake will improve Outcome: Progressing   Problem: Role Relationship: Goal:  Ability to communicate needs accurately will improve Outcome: Progressing Goal: Ability to interact with others will improve Outcome: Progressing   Problem: Safety: Goal: Ability to redirect hostility and anger into socially appropriate behaviors will improve Outcome: Progressing Goal: Ability to remain free from injury will improve Outcome: Progressing   Problem: Self-Care: Goal: Ability to participate in self-care as condition permits will improve Outcome: Progressing   Problem: Self-Concept: Goal: Will verbalize positive feelings about self Outcome: Progressing   

## 2022-09-08 ENCOUNTER — Encounter (HOSPITAL_COMMUNITY): Payer: Self-pay

## 2022-09-08 DIAGNOSIS — F23 Brief psychotic disorder: Secondary | ICD-10-CM | POA: Diagnosis not present

## 2022-09-08 MED ORDER — ARIPIPRAZOLE 5 MG PO TABS
5.0000 mg | ORAL_TABLET | ORAL | Status: AC
  Administered 2022-09-08: 5 mg via ORAL
  Filled 2022-09-08: qty 1

## 2022-09-08 MED ORDER — ARIPIPRAZOLE 10 MG PO TABS
10.0000 mg | ORAL_TABLET | Freq: Every day | ORAL | Status: DC
  Administered 2022-09-09: 10 mg via ORAL
  Filled 2022-09-08 (×3): qty 1

## 2022-09-08 MED ORDER — FLUOXETINE HCL 20 MG PO CAPS
20.0000 mg | ORAL_CAPSULE | Freq: Every day | ORAL | Status: DC
  Administered 2022-09-09 – 2022-09-11 (×3): 20 mg via ORAL
  Filled 2022-09-08 (×4): qty 1

## 2022-09-08 NOTE — Progress Notes (Signed)
   09/07/22 2200  Psych Admission Type (Psych Patients Only)  Admission Status Voluntary  Psychosocial Assessment  Patient Complaints Insomnia  Eye Contact Fair  Facial Expression Flat  Affect Flat  Speech Logical/coherent  Interaction Guarded  Motor Activity Slow  Appearance/Hygiene Unremarkable  Behavior Characteristics Cooperative  Mood Depressed  Thought Process  Coherency WDL  Content Paranoia  Delusions Paranoid  Perception Hallucinations  Hallucination Auditory  Judgment Impaired  Confusion None  Danger to Self  Current suicidal ideation? Denies  Agreement Not to Harm Self Yes  Description of Agreement verbal  Danger to Others  Danger to Others None reported or observed

## 2022-09-08 NOTE — Progress Notes (Signed)
Patient A&Ox4. Patient denies SI/HI and AVH. Denies any physical complaints when asked. No acute distress noted. Support and encouragement provided. Routine safety checks conducted according to facility protocol. Encouraged patient to notify staff if thoughts of harm toward self or others arise. Patient verbalize understanding and agreement. Will continue to monitor for safety.

## 2022-09-08 NOTE — Progress Notes (Signed)
   09/08/22 1400  Psych Admission Type (Psych Patients Only)  Admission Status Voluntary  Psychosocial Assessment  Patient Complaints Insomnia  Eye Contact Fair  Facial Expression Flat  Affect Flat  Speech Logical/coherent  Interaction Cautious  Motor Activity Slow  Appearance/Hygiene Unremarkable  Behavior Characteristics Cooperative  Mood Depressed  Thought Process  Coherency WDL  Content Paranoia  Delusions Paranoid  Perception Hallucinations  Hallucination Auditory  Judgment Impaired  Confusion None  Danger to Self  Current suicidal ideation? Denies  Danger to Others  Danger to Others None reported or observed

## 2022-09-08 NOTE — Progress Notes (Signed)
   09/08/22 2100  Psych Admission Type (Psych Patients Only)  Admission Status Voluntary  Psychosocial Assessment  Patient Complaints Insomnia  Eye Contact Fair  Facial Expression Flat  Affect Flat  Speech Logical/coherent  Interaction Cautious  Motor Activity Slow  Appearance/Hygiene Unremarkable  Behavior Characteristics Cooperative  Mood Depressed  Thought Process  Coherency WDL  Content Paranoia  Delusions Paranoid  Perception Hallucinations  Hallucination Auditory  Judgment Impaired  Confusion None  Danger to Self  Current suicidal ideation? Denies  Agreement Not to Harm Self Yes  Description of Agreement verbal  Danger to Others  Danger to Others None reported or observed

## 2022-09-08 NOTE — BH IP Treatment Plan (Signed)
Interdisciplinary Treatment and Diagnostic Plan Update  09/08/2022 Time of Session: 10:50 AM  Don Hurst MRN: [id number]  Principal Diagnosis: Brief psychotic disorder [facility])  Secondary Diagnoses: Principal Problem:   Brief psychotic disorder (HCC) Active Problems:   Psychosis, unspecified psychosis type (HCC)   Current Medications:  Current Facility-Administered Medications  Medication Dose Route Frequency Provider Last Rate Last Admin   acetaminophen (TYLENOL) tablet 650 mg  650 mg Oral Q6H PRN Bobbitt, Shalon E, NP       albuterol (VENTOLIN HFA) 108 (90 Base) MCG/ACT inhaler 1-2 puff  1-2 puff Inhalation Q4H PRN Nwoko, Agnes I, NP       alum & mag hydroxide-simeth (MAALOX/MYLANTA) 200-200-20 MG/5ML suspension 30 mL  30 mL Oral Q4H PRN Bobbitt, Shalon E, NP       ARIPiprazole (ABILIFY) tablet 5 mg  5 mg Oral Daily Nwoko, Agnes I, NP   5 mg at 09/08/22 0813   atorvastatin (LIPITOR) tablet 20 mg  20 mg Oral Daily Armandina Stammer I, NP   20 mg at 09/08/22 0813   diphenhydrAMINE (BENADRYL) capsule 50 mg  50 mg Oral TID PRN Bobbitt, Shalon E, NP       Or   diphenhydrAMINE (BENADRYL) injection 50 mg  50 mg Intramuscular TID PRN Bobbitt, Shalon E, NP       feeding supplement (ENSURE ENLIVE / ENSURE PLUS) liquid 237 mL  237 mL Oral BID BM Massengill, Nathan, MD   237 mL at 09/06/22 1830   FLUoxetine (PROZAC) capsule 10 mg  10 mg Oral Daily Armandina Stammer I, NP   10 mg at 09/08/22 0813   haloperidol (HALDOL) tablet 5 mg  5 mg Oral TID PRN Bobbitt, Shalon E, NP       Or   haloperidol lactate (HALDOL) injection 5 mg  5 mg Intramuscular TID PRN Bobbitt, Shalon E, NP       hydrOXYzine (ATARAX) tablet 25 mg  25 mg Oral TID PRN Bobbitt, Shalon E, NP   25 mg at 09/07/22 2052   levothyroxine (SYNTHROID) tablet 100 mcg  100 mcg Oral Daily Armandina Stammer I, NP   100 mcg at 09/08/22 0813   loratadine (CLARITIN) tablet 10 mg  10 mg Oral Daily Armandina Stammer I, NP   10 mg at 09/08/22 0813   LORazepam (ATIVAN)  tablet 2 mg  2 mg Oral TID PRN Bobbitt, Shalon E, NP       Or   LORazepam (ATIVAN) injection 2 mg  2 mg Intramuscular TID PRN Bobbitt, Shalon E, NP       magnesium hydroxide (MILK OF MAGNESIA) suspension 30 mL  30 mL Oral Daily PRN Bobbitt, Shalon E, NP       mometasone-formoterol (DULERA) 100-5 MCG/ACT inhaler 2 puff  2 puff Inhalation BID Armandina Stammer I, NP   2 puff at 09/08/22 0813   omega-3 acid ethyl esters (LOVAZA) capsule 1 g  1 g Oral Daily Nwoko, Nicole Kindred I, NP   1 g at 09/08/22 0814   traZODone (DESYREL) tablet 100 mg  100 mg Oral QHS Nwoko, Nicole Kindred I, NP       vitamin D3 (CHOLECALCIFEROL) tablet 1,000 Units  1,000 Units Oral Daily Armandina Stammer I, NP   1,000 Units at 09/08/22 0813   PTA Medications: Medications Prior to Admission  Medication Sig Dispense Refill Last Dose   atorvastatin (LIPITOR) 20 MG tablet TAKE 1 TABLET(20 MG) BY MOUTH DAILY (Patient taking differently: Take 20 mg by mouth daily.) 90 tablet 1  budesonide-formoterol (SYMBICORT) 80-4.5 MCG/ACT inhaler Inhale 2 puffs into the lungs 2 (two) times daily. (Patient taking differently: Inhale 2 puffs into the lungs 2 (two) times daily as needed (For shortness of breath).) 10.2 g 2    cetirizine (ZYRTEC) 10 MG tablet Take 10 mg by mouth daily.      chlorpheniramine (CHLOR-TRIMETON) 4 MG tablet Take 4 mg by mouth daily.      Cholecalciferol (VITAMIN D PO) Take 1 tablet by mouth daily.       Cyanocobalamin (VITAMIN B 12 PO) Take 1 tablet by mouth daily.      ELDERBERRY PO Take 1 tablet by mouth daily.      EPINEPHrine 0.3 mg/0.3 mL IJ SOAJ injection Inject 0.3 mg into the muscle as needed for anaphylaxis.      levothyroxine (SYNTHROID) 100 MCG tablet TAKE 1 TABLET(100 MCG) BY MOUTH DAILY BEFORE BREAKFAST (Patient taking differently: Take 100 mcg by mouth daily.) 90 tablet 1    montelukast (SINGULAIR) 10 MG tablet Take 1 tablet (10 mg total) by mouth at bedtime. 90 tablet 3    Omega-3 Fatty Acids (FISH OIL) 1000 MG CPDR Take 1,000  mg by mouth daily.      PROAIR HFA 108 (90 Base) MCG/ACT inhaler 1-2 puffs every 4-6 hours as needed for shortness of breath or wheezing (Patient taking differently: 1-2 puffs every 4 (four) hours as needed for wheezing or shortness of breath.) 8.5 g 0    tetrahydrozoline 0.05 % ophthalmic solution Place 1-2 drops into both eyes 4 (four) times daily as needed (For eye irritation).      vitamin C (ASCORBIC ACID) 500 MG tablet Take 500 mg by mouth daily.      VITAMIN E PO Take 1 tablet by mouth daily.        Patient Stressors: Other: "Paranoia"    Patient Strengths: Ability for insight  Average or above average intelligence  Capable of independent living  Education administrator  Motivation for treatment/growth  Religious Affiliation  Supportive family/friends   Treatment Modalities: Medication Management, Group therapy, Case management,  1 to 1 session with clinician, Psychoeducation, Recreational therapy.   Physician Treatment Plan for Primary Diagnosis: Brief psychotic disorder (HCC) Long Term Goal(s): Improvement in symptoms so as ready for discharge   Short Term Goals: Ability to identify and develop effective coping behaviors will improve Ability to maintain clinical measurements within normal limits will improve Compliance with prescribed medications will improve Ability to identify changes in lifestyle to reduce recurrence of condition will improve Ability to verbalize feelings will improve Ability to disclose and discuss suicidal ideas Ability to demonstrate self-control will improve  Medication Management: Evaluate patient's response, side effects, and tolerance of medication regimen.  Therapeutic Interventions: 1 to 1 sessions, Unit Group sessions and Medication administration.  Evaluation of Outcomes: Not Progressing  Physician Treatment Plan for Secondary Diagnosis: Principal Problem:   Brief psychotic disorder (HCC) Active Problems:   Psychosis,  unspecified psychosis type (HCC)  Long Term Goal(s): Improvement in symptoms so as ready for discharge   Short Term Goals: Ability to identify and develop effective coping behaviors will improve Ability to maintain clinical measurements within normal limits will improve Compliance with prescribed medications will improve Ability to identify changes in lifestyle to reduce recurrence of condition will improve Ability to verbalize feelings will improve Ability to disclose and discuss suicidal ideas Ability to demonstrate self-control will improve     Medication Management: Evaluate patient's response, side effects, and  tolerance of medication regimen.  Therapeutic Interventions: 1 to 1 sessions, Unit Group sessions and Medication administration.  Evaluation of Outcomes: Not Progressing   RN Treatment Plan for Primary Diagnosis: Brief psychotic disorder (HCC) Long Term Goal(s): Knowledge of disease and therapeutic regimen to maintain health will improve  Short Term Goals: Ability to remain free from injury will improve, Ability to verbalize frustration and anger appropriately will improve, Ability to demonstrate self-control, Ability to participate in decision making will improve, Ability to verbalize feelings will improve, Ability to disclose and discuss suicidal ideas, Ability to identify and develop effective coping behaviors will improve, and Compliance with prescribed medications will improve  Medication Management: RN will administer medications as ordered by provider, will assess and evaluate patient's response and provide education to patient for prescribed medication. RN will report any adverse and/or side effects to prescribing provider.  Therapeutic Interventions: 1 on 1 counseling sessions, Psychoeducation, Medication administration, Evaluate responses to treatment, Monitor vital signs and CBGs as ordered, Perform/monitor CIWA, COWS, AIMS and Fall Risk screenings as ordered, Perform  wound care treatments as ordered.  Evaluation of Outcomes: Not Progressing   LCSW Treatment Plan for Primary Diagnosis: Brief psychotic disorder [facility]) Long Term Goal(s): Safe transition to appropriate next level of care at discharge, Engage patient in therapeutic group addressing interpersonal concerns.  Short Term Goals: Engage patient in aftercare planning with referrals and resources, Increase social support, Increase ability to appropriately verbalize feelings, Increase emotional regulation, Facilitate acceptance of mental health diagnosis and concerns, Facilitate patient progression through stages of change regarding substance use diagnoses and concerns, Identify triggers associated with mental health/substance abuse issues, and Increase skills for wellness and recovery  Therapeutic Interventions: Assess for all discharge needs, 1 to 1 time with Social worker, Explore available resources and support systems, Assess for adequacy in community support network, Educate family and significant other(s) on suicide prevention, Complete Psychosocial Assessment, Interpersonal group therapy.  Evaluation of Outcomes: Not Progressing   Progress in Treatment: Attending groups: Yes. Participating in groups: Yes. Taking medication as prescribed: Yes. Toleration medication: Yes. Doctor did inform patient that two of his medication would be increased since he is still experiencing noise sensitivity  Family/Significant other contact made: No; will contact : wife [name] Ledonne [phone] Patient understands diagnosis: No. Discussing patient identified problems/goals with staff: Yes. Medical problems stabilized or resolved: Yes. Denies suicidal/homicidal ideation: Yes. Issues/concerns per patient self-inventory: No.   New problem(s) identified: No, Describe:  None reported   New Short Term/Long Term Goal(s):medication stabilization, elimination of SI thoughts, development of comprehensive mental  wellness plan.    Patient Goals:  " get back to where I was and stop hearing/seeing things"   Discharge Plan or Barriers: Patient recently admitted. CSW will continue to follow and assess for appropriate referrals and possible discharge planning.    Reason for Continuation of Hospitalization: Delusions  Hallucinations Medication stabilization Suicidal ideation  Estimated Length of Stay: 5-7 days   Last 3 [place] Suicide Severity Risk Score: Flowsheet Row [facility]Current[facility] Most recent reading at 09/05/2022 11:55 PM ED from 09/05/2022 in [facility] Most recent reading at 09/05/2022 11:59 AM Video Visit from 08/26/2022 in [facility] Most recent reading at 08/26/2022  1:23 PM  C-SSRS RISK CATEGORY No Risk No Risk No Risk       Last PHQ 2/9 Scores:    08/26/2022    1:23 PM 06/11/2021   11:20  AM 12/04/2020   10:22 AM  Depression screen PHQ 2/9  Decreased Interest 3 0 0  Down, Depressed, Hopeless 3 0 0  PHQ - 2 Score 6 0 0  Altered sleeping 2    Tired, decreased energy 1    Change in appetite 0    Feeling bad or failure about yourself  1    Trouble concentrating 1    Moving slowly or fidgety/restless 1    Suicidal thoughts 1    PHQ-9 Score 13    Difficult doing work/chores Extremely dIfficult      Scribe for Treatment Team: Beather Arbour 09/08/2022 11:28 AM

## 2022-09-08 NOTE — Progress Notes (Signed)
[facility] MD Progress Note  09/08/2022 2:43 PM Don Hurst  MRN:  [id number]  Subjective:   62 year old AA male with no known hx of psychiatric hospitalizations, diagnoses or treatments until now. There were no hx of substance use. Patient is admitted to the [facility] from the [facility] with complaint of new unset of psychosis that comprises of paranoia, delusional thoughts/hallucinations. After the [facility] initial evaluation, patient was transferred to the [facility] for further psychiatric evaluation/treatments. A review of his current lab results has shown an elevated hgba1c 6.1 & toxicology/UDS results were negative of all illegal substances.    On my evaluation today, the patient reports that he is no longer having hallucinations.  He does report that paranoia persists.  He reports that he has difficulty concentrating.  His thoughts are somewhat disorganized and tangential.  He reports that he acknowledges he is confused and has difficulty recalling symptoms leading up to admission.  Reports that his mood is depressed and anxious.  Reports sleep is better.  Appetite is okay.  Denies any SI or HI.  Denies any side effects to current psychiatric medications.  He is agreeable with increasing the dose of Abilify and Prozac.   Principal Problem: Brief psychotic disorder (HCC) Diagnosis: Principal Problem:   Brief psychotic disorder (HCC) Active Problems:   Psychosis, unspecified psychosis type (HCC)  Total Time spent with patient: 20 minutes  Past Psychiatric History: See H&P  Past Medical History:  Past Medical History:  Diagnosis Date   Allergy    Arthritis    Asthma    GERD (gastroesophageal reflux disease)    History of chronic bronchitis    Multiple allergies    Wears glasses     Past Surgical History:  Procedure Laterality Date   COLONOSCOPY     INGUINAL HERNIA REPAIR  02/2004   rt   INGUINAL HERNIA REPAIR Left 02/06/2016   Procedure: LAPAROSCOPIC LEFT INGUINAL HERNIA REPAIR;  Surgeon: Berna Bue, MD;  Location: [facility];  Service: General;  Laterality: Left;   INSERTION OF MESH Left 02/06/2016   Procedure: INSERTION OF MESH;  Surgeon: Berna Bue, MD;  Location: [facility];  Service: General;  Laterality: Left;   PAROTIDECTOMY Left 02/08/2014   Procedure: LEFT SUPERFICIAL PAROTIDECTOMY;  Surgeon: Flo Shanks, MD;  Location: [facility];  Service: ENT;  Laterality: Left;   Family History:  Family History  Problem Relation Age of Onset   Hypertension Mother        per pt dx around 86 y o   Cancer Father 23       colon   Eczema Daughter    Hypertension Sister    Family Psychiatric  History: See H&P  Social History:  Social History   Substance and Sexual Activity  Alcohol Use No     Social History   Substance and Sexual Activity  Drug Use No    Social History   Socioeconomic History   Marital status: Married    Spouse name: Not on file   Number of children: Not on file   Years of education: Not on file   Highest education level: Not on file  Occupational History   Not on file  Tobacco Use   Smoking status: Former    Current packs/day: 0.00    Average packs/day: 0.3 packs/day for 3.0 years (0.8 ttl pk-yrs)    Types: Cigarettes    Start date: 03/25/1988    Quit date: 03/26/1991    Years  since quitting: 31.4   Smokeless tobacco: Never   Tobacco comments:    smoked 1 pk every 2 wks  Vaping Use   Vaping status: Never Used  Substance and Sexual Activity   Alcohol use: No   Drug use: No   Sexual activity: Yes    Comment: number of sex partners in the last 12 months  1  Other Topics Concern   Not on file  Social History Narrative   Exercise doing weights 4 times per week for 30 minutes   Social Determinants of Health   Financial Resource Strain: Not on file  Food Insecurity: No Food Insecurity (09/05/2022)   Hunger Vital Sign    Worried About Running Out of Food in the Last Year: Never true    Ran Out of Food in the Last Year: Never  true  Transportation Needs: No Transportation Needs (09/05/2022)   PRAPARE - Administrator, Civil Service (Medical): No    Lack of Transportation (Non-Medical): No  Physical Activity: Not on file  Stress: Not on file  Social Connections: Not on file   Additional Social History:                          Current Medications: Current Facility-Administered Medications  Medication Dose Route Frequency Provider Last Rate Last Admin   acetaminophen (TYLENOL) tablet 650 mg  650 mg Oral Q6H PRN Bobbitt, Shalon E, NP       albuterol (VENTOLIN HFA) 108 (90 Base) MCG/ACT inhaler 1-2 puff  1-2 puff Inhalation Q4H PRN Nwoko, Agnes I, NP       alum & mag hydroxide-simeth (MAALOX/MYLANTA) 200-200-20 MG/5ML suspension 30 mL  30 mL Oral Q4H PRN Bobbitt, Shalon E, NP       [START ON 09/09/2022] ARIPiprazole (ABILIFY) tablet 10 mg  10 mg Oral Daily Mikell Kazlauskas, MD       ARIPiprazole (ABILIFY) tablet 5 mg  5 mg Oral NOW Toccara Alford, Harrold Donath, MD       atorvastatin (LIPITOR) tablet 20 mg  20 mg Oral Daily Armandina Stammer I, NP   20 mg at 09/08/22 0813   diphenhydrAMINE (BENADRYL) capsule 50 mg  50 mg Oral TID PRN Bobbitt, Shalon E, NP       Or   diphenhydrAMINE (BENADRYL) injection 50 mg  50 mg Intramuscular TID PRN Bobbitt, Shalon E, NP       feeding supplement (ENSURE ENLIVE / ENSURE PLUS) liquid 237 mL  237 mL Oral BID BM Kaisyn Reinhold, Harrold Donath, MD   237 mL at 09/06/22 1830   [START ON 09/09/2022] FLUoxetine (PROZAC) capsule 20 mg  20 mg Oral Daily Merita Hawks, MD       haloperidol (HALDOL) tablet 5 mg  5 mg Oral TID PRN Bobbitt, Shalon E, NP       Or   haloperidol lactate (HALDOL) injection 5 mg  5 mg Intramuscular TID PRN Bobbitt, Shalon E, NP       hydrOXYzine (ATARAX) tablet 25 mg  25 mg Oral TID PRN Bobbitt, Shalon E, NP   25 mg at 09/07/22 2052   levothyroxine (SYNTHROID) tablet 100 mcg  100 mcg Oral Daily Armandina Stammer I, NP   100 mcg at 09/08/22 0813   loratadine (CLARITIN)  tablet 10 mg  10 mg Oral Daily Armandina Stammer I, NP   10 mg at 09/08/22 0813   LORazepam (ATIVAN) tablet 2 mg  2 mg Oral TID PRN Bobbitt, Shalon E,  NP       Or   LORazepam (ATIVAN) injection 2 mg  2 mg Intramuscular TID PRN Bobbitt, Shalon E, NP       magnesium hydroxide (MILK OF MAGNESIA) suspension 30 mL  30 mL Oral Daily PRN Bobbitt, Shalon E, NP       mometasone-formoterol (DULERA) 100-5 MCG/ACT inhaler 2 puff  2 puff Inhalation BID Armandina Stammer I, NP   2 puff at 09/08/22 0813   omega-3 acid ethyl esters (LOVAZA) capsule 1 g  1 g Oral Daily Nwoko, Nicole Kindred I, NP   1 g at 09/08/22 0814   traZODone (DESYREL) tablet 100 mg  100 mg Oral QHS Nwoko, Nicole Kindred I, NP       vitamin D3 (CHOLECALCIFEROL) tablet 1,000 Units  1,000 Units Oral Daily Armandina Stammer I, NP   1,000 Units at 09/08/22 0813    Lab Results: No results found for this or any previous visit (from the past 48 hour(s)).  Blood Alcohol level:  Lab Results  Component Value Date   ETH <10 09/05/2022    Metabolic Disorder Labs: Lab Results  Component Value Date   HGBA1C 6.1 (H) 09/05/2022   MPG 128 09/05/2022   No results found for: "PROLACTIN" Lab Results  Component Value Date   CHOL 126 09/05/2022   TRIG 25 09/05/2022   HDL 52 09/05/2022   CHOLHDL 2.4 09/05/2022   VLDL 5 09/05/2022   LDLCALC 69 09/05/2022   LDLCALC 85 06/17/2022    Physical Findings: AIMS:  , ,  ,  ,    CIWA:    COWS:     Musculoskeletal: Strength & Muscle Tone: within normal limits Gait & Station: normal Patient leans: N/A  Psychiatric Specialty Exam:  Presentation  General Appearance:  Casual  Eye Contact: Fair  Speech: Normal Rate  Speech Volume: Normal  Handedness: Right   Mood and Affect  Mood: Anxious  Affect: Congruent; Constricted   Thought Process  Thought Processes: Disorganized  Descriptions of Associations:Tangential  Orientation:Full (Time, Place and Person)  Thought Content:Paranoid Ideation;  Tangential  History of Schizophrenia/Schizoaffective disorder:No  Duration of Psychotic Symptoms:Less than six months  Hallucinations:Hallucinations: None  Ideas of Reference:Paranoia  Suicidal Thoughts:Suicidal Thoughts: No  Homicidal Thoughts:Homicidal Thoughts: No   Sensorium  Memory: Immediate Fair; Recent Fair; Remote Fair  Judgment: Fair  Insight: Poor   Executive Functions  Concentration: Fair  Attention Span: Fair  Recall: Fair  Fund of Knowledge: Poor  Language: Good   Psychomotor Activity  Psychomotor Activity: Psychomotor Activity: Normal   Assets  Assets: Communication Skills; Desire for Improvement; Financial Resources/Insurance; Housing; Physical Health; Resilience; Social Support   Sleep  Sleep: Sleep: Fair    Physical Exam: Physical Exam Vitals reviewed.  Constitutional:      General: He is not in acute distress.    Appearance: He is normal weight. He is not toxic-appearing.  Pulmonary:     Effort: Pulmonary effort is normal. No respiratory distress.  Neurological:     Mental Status: He is alert.     Motor: No weakness.     Gait: Gait normal.  Psychiatric:        Behavior: Behavior normal.    Review of Systems  Constitutional:  Negative for chills and fever.  Cardiovascular:  Negative for chest pain and palpitations.  Neurological:  Negative for dizziness, tingling, tremors and headaches.  Psychiatric/Behavioral:  Negative for depression, hallucinations, memory loss, substance abuse and suicidal ideas. The patient is nervous/anxious. The patient does  not have insomnia.   All other systems reviewed and are negative.  Blood pressure 132/87, pulse 99, temperature 98.4 F (36.9 C), temperature source Oral, resp. rate (!) 24, height 5\' 10"  (1.778 m), weight 76.6 kg, SpO2 100%. Body mass index is 24.22 kg/m.   Treatment Plan Summary: Daily contact with patient to assess and evaluate symptoms and progress in treatment  and Medication management   ASSESSMENT:  Diagnoses / Active Problems: Brief psychotic disorder   PLAN: Safety and Monitoring:  --  Voluntary admission to inpatient psychiatric unit for safety, stabilization and treatment  -- Daily contact with patient to assess and evaluate symptoms and progress in treatment  -- Patient's case to be discussed in multi-disciplinary team meeting  -- Observation Level : q15 minute checks  -- Vital signs:  q12 hours  -- Precautions: suicide, elopement, and assault  2. Psychiatric Diagnoses and Treatment:    -Increase Abilify from 5 mg to 10 mg once daily for psychosis -Increase Prozac from 10 mg to 20 mg once daily for depression, anxiety, and some OCD type symptoms    --  The risks/benefits/side-effects/alternatives to this medication were discussed in detail with the patient and time was given for questions. The patient consents to medication trial.    -- Metabolic profile and EKG monitoring obtained while on an atypical antipsychotic (BMI: Lipid Panel: HbgA1c: QTc:)   -- Encouraged patient to participate in unit milieu and in scheduled group therapies       3. Medical Issues Being Addressed:     4. Discharge Planning:   -- Social work and case management to assist with discharge planning and identification of hospital follow-up needs prior to discharge  -- Estimated LOS: 5-7 days  -- Discharge Concerns: Need to establish a safety plan; Medication compliance and effectiveness  -- Discharge Goals: Return home with outpatient referrals for mental health follow-up including medication management/psychotherapy    Cristy Hilts, MD 09/08/2022, 2:43 PM   Total Time Spent in Direct Patient Care:  I personally spent 35 minutes on the unit in direct patient care. The direct patient care time included face-to-face time with the patient, reviewing the patient's chart, communicating with other professionals, and coordinating care. Greater than  50% of this time was spent in counseling or coordinating care with the patient regarding goals of hospitalization, psycho-education, and discharge planning needs.   Phineas Inches, MD Psychiatrist

## 2022-09-08 NOTE — Group Note (Signed)
LCSW Group Therapy Note  Group Date: 09/08/2022 Start Time: 1300 End Time: 1400   Type of Therapy and Topic:  Group Therapy - How To Cope with Nervousness about Discharge   Participation Level:  Active   Description of Group This process group involved identification of patients' feelings about discharge. Some of them are scheduled to be discharged soon, while others are new admissions, but each of them was asked to share thoughts and feelings surrounding discharge from the hospital. One common theme was that they are excited at the prospect of going home, while another was that many of them are apprehensive about sharing why they were hospitalized. Patients were given the opportunity to discuss these feelings with their peers in preparation for discharge.  Therapeutic Goals  Patient will identify their overall feelings about pending discharge. Patient will think about how they might proactively address issues that they believe will once again arise once they get home (i.e. with parents). Patients will participate in discussion about having hope for change.   Summary of Patient Progress:  Patient was very active throughout the session. Patient demonstrated good insight into the subject matter, and proved open to input from peers and feedback from CSW. Patient was respectful of peers and participated throughout the entire session.   Therapeutic Modalities Cognitive Behavioral Therapy   Beather Arbour 09/08/2022  3:02 PM

## 2022-09-08 NOTE — [facility] Suicide Risk Assessment (Signed)
[facility] INPATIENT:  Family/Significant Other Suicide Prevention Education  Suicide Prevention Education:  Education Completed; 09-08-2022,  [name] Luthi (Spouse)  has been identified by the patient as the Spouse significant other with whom the patient will be residing, and identified as the person(s) who will aid the patient in the event of a mental health crisis (suicidal ideations/suicide attempt).  With written consent from the patient, the significant other has been provided the following suicide prevention education, prior to the and/or following the discharge of the patient.  The suicide prevention education provided includes the following: Suicide risk factors Suicide prevention and interventions National Suicide Hotline telephone number [facility] assessment telephone number [facility] Emergency Assistance 911 [facility] and/or Residential Mobile Crisis Unit telephone number  Request made of family/significant other to: Remove weapons (e.g., guns, rifles, knives), all items previously/currently identified as safety concern.   Remove drugs/medications (over-the-counter, prescriptions, illicit drugs), all items previously/currently identified as a safety concern.  De Carlos Kind (Spouse) verbalizes understanding of the suicide prevention education information provided.  The family member/significant other agrees to remove the items of safety concern listed above.  Constant Mandeville S Benecio Kluger 09/08/2022, 3:45 PM

## 2022-09-08 NOTE — Group Note (Signed)
Recreation Therapy Group Note   Group Topic:Health and Wellness  Group Date: 09/08/2022 Start Time: 1020 End Time: 1050 Facilitators: Johann Gascoigne-McCall, LRT,CTRS Location: [address] Dayroom   Goal Area(s) Addresses:  Patient will define components of whole wellness. Patient will verbalize benefit of whole wellness.  Group Description: Exercise. LRT and patients discussed the importance of good physical health. LRT explained the activity to patients. Patients were informed they were going to take turns leading the group in the exercises of their choosing. Patients were to complete at least 20-30 minutes of exercise. Patients were encouraged to take breaks and get water as needed.   Affect/Mood: Appropriate   Participation Level: Active   Participation Quality: Independent   Behavior: Appropriate   Speech/Thought Process: Focused   Insight: Good   Judgement: Good   Modes of Intervention: Music and Exercise   Patient Response to Interventions:  Engaged   Education Outcome:  Acknowledges education   Clinical Observations/Individualized Feedback: Pt was quiet but participated in the exercises that he could. Pt was appropriate and engaged during group session.     Plan: Continue to engage patient in RT group sessions 2-3x/week.   Daliah Chaudoin-McCall, LRT,CTRS 09/08/2022 12:03 PM

## 2022-09-08 NOTE — Group Note (Signed)
Date:  09/08/2022 Time:  8:55 PM  Group Topic/Focus:  Wrap-Up Group:   The focus of this group is to help patients review their daily goal of treatment and discuss progress on daily workbooks.    Participation Level:  Active  Participation Quality:  Appropriate  Affect:  Appropriate  Cognitive:  Appropriate  Insight: Appropriate  Engagement in Group:  Engaged  Modes of Intervention:  Education and Exploration  Additional Comments:  Patient attended and participated in group tonight. He reports that his day was a 5 because he is still working on getting to find himself.  Lita Mains [facility] 09/08/2022, 8:55 PM

## 2022-09-08 NOTE — Plan of Care (Signed)
  Problem: Physical Regulation: Goal: Ability to maintain clinical measurements within normal limits will improve Outcome: Progressing   Problem: Safety: Goal: Periods of time without injury will increase Outcome: Progressing   

## 2022-09-08 NOTE — Progress Notes (Signed)
Recreation Therapy Notes  INPATIENT RECREATION THERAPY ASSESSMENT  Patient Details Name: Don Hurst MRN: [id number] DOB: August 30, 1960 Today's Date: 09/08/2022       Information Obtained From: Patient  Able to Participate in Assessment/Interview: Yes  Patient Presentation: Alert  Reason for Admission (Per Patient): Suicidal Ideation, Other (Comments) (Paranoia, Hallucinations)  Patient Stressors: Other (Comment) ("serveral things")  Coping Skills:   Isolation, TV, Sports, Music, Exercise, Meditate, Talk, Prayer, Avoidance, Read  Leisure Interests (2+):  Crafts - Other (Comment), Nature - Other (Comment) (Build things; Be outside)  Frequency of Recreation/Participation: Weekly  Awareness of Community Resources:  Yes  Community Resources:  [employer], [employer], Public affairs consultant  Current Use: No  If no, Barriers?: Other (Comment) ("I really don't know")  Expressed Interest in [employer] Information: No  County of Residence:  [place]  Patient Main Form of Transportation: Set designer  Patient Strengths:  "build things, usually communication"  Patient Identified Areas of Improvement:  Decisions  Patient Goal for Hospitalization:  "try to get back to where I was"  Current SI (including self-harm):  No  Current HI:  No  Current AVH: Yes (Hearing sounds bothering things)  Staff Intervention Plan: Group Attendance, Collaborate with Interdisciplinary Treatment Team  Consent to Intern Participation: N/A   Don Hurst, LRT,CTRS Don Hurst 09/08/2022, 1:33 PM

## 2022-09-08 NOTE — Plan of Care (Signed)
  Problem: Physical Regulation: Goal: Ability to maintain clinical measurements within normal limits will improve 09/08/2022 0613 by Lahoma Crocker, RN Outcome: Progressing 09/08/2022 0613 by Lahoma Crocker, RN Outcome: Progressing   Problem: Safety: Goal: Periods of time without injury will increase 09/08/2022 0613 by Lahoma Crocker, RN Outcome: Progressing 09/08/2022 0613 by Lahoma Crocker, RN Outcome: Progressing   Problem: Activity: Goal: Will verbalize the importance of balancing activity with adequate rest periods Outcome: Progressing

## 2022-09-09 DIAGNOSIS — F23 Brief psychotic disorder: Secondary | ICD-10-CM | POA: Diagnosis not present

## 2022-09-09 MED ORDER — ARIPIPRAZOLE 15 MG PO TABS
15.0000 mg | ORAL_TABLET | Freq: Every day | ORAL | Status: DC
  Administered 2022-09-10: 15 mg via ORAL
  Filled 2022-09-09 (×3): qty 1
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
## 2022-09-09 NOTE — Group Note (Signed)
Date:  09/09/2022 Time:  8:35 PM  Group Topic/Focus:  Wrap-Up Group:   The focus of this group is to help patients review their daily goal of treatment and discuss progress on daily workbooks.    Participation Level:  Active  Participation Quality:  Appropriate and Sharing  Affect:  Appropriate  Cognitive:  Appropriate  Insight: Appropriate  Engagement in Group:  Engaged  Modes of Intervention:  Discussion  Additional Comments:  Patient stated that today was a 50/50 day. Patient stated that is was hard to sleep and that it's been like that for the past few years. The patient goal for today was to stay positive 50/50; patient rated 4 out of 10. The patient goal for tomorrow is to stay positive.   Kennieth Francois 09/09/2022, 8:35 PM

## 2022-09-09 NOTE — Group Note (Signed)
Recreation Therapy Group Note   Group Topic:Coping Skills  Group Date: 09/09/2022 Start Time: 1048 End Time: 1110 Facilitators: Ryllie Nieland-McCall, LRT,CTRS Location: 500 [address] Dayroom   Goal Area(s) Addresses: Patient will define what a coping skill is. Patient will successfully identify positive coping skills they can use post d/c.  Patient will acknowledge benefit(s) of using learned coping skills post d/c.    Group Description: Coping A to Z. Patient asked to identify what a coping skill is and when they use them. Patients with Clinical research associate discussed healthy versus unhealthy coping skills. Next patients were given a blank worksheet titled "Coping Skills A-Z". Patients were instructed to come up with at least one positive coping skill per letter of the alphabet.  Patients were given 15 minutes to brainstorm, before ideas were presented to the large group. Patients and LRT debriefed on the importance of coping skill selection based on situation and back-up plans when a skill tried is not effective. At the end of group, patients were given an handout of alphabetized strategies to keep for future reference.   Affect/Mood: N/A   Participation Level: Did not attend    Clinical Observations/Individualized Feedback:     Plan: Continue to engage patient in RT group sessions 2-3x/week.   Trei Schoch-McCall, LRT,CTRS 09/09/2022 12:17 PM

## 2022-09-09 NOTE — Plan of Care (Signed)
  Problem: Coping: Goal: Ability to verbalize frustrations and anger appropriately will improve Outcome: Progressing Goal: Ability to demonstrate self-control will improve Outcome: Progressing   Problem: Health Behavior/Discharge Planning: Goal: Identification of resources available to assist in meeting health care needs will improve Outcome: Progressing Goal: Compliance with treatment plan for underlying cause of condition will improve Outcome: Progressing   Problem: Physical Regulation: Goal: Ability to maintain clinical measurements within normal limits will improve Outcome: Progressing   Problem: Safety: Goal: Periods of time without injury will increase Outcome: Progressing   

## 2022-09-09 NOTE — Group Note (Signed)
Date:  09/09/2022 Time:  10:17 AM  Group Topic/Focus:  Goals Group:   The focus of this group is to help patients establish daily goals to achieve during treatment and discuss how the patient can incorporate goal setting into their daily lives to aide in recovery. Orientation:   The focus of this group is to educate the patient on the purpose and policies of crisis stabilization and provide a format to answer questions about their admission.  The group details unit policies and expectations of patients while admitted.     Participation Level:  Did Not Attend  Deforest Hoyles [facility] 09/09/2022, 10:17 AM

## 2022-09-09 NOTE — Progress Notes (Signed)
Dar Note: Patient presents with anxious affect and paranoid behavior.  Patient is withdrawn and isolative to his room.  Denies suicidal thoughts, auditory and visual hallucinations.  Wife called to report patient delusion about [employer] coming to arrest him.  Told wife to retain an attorney for him.  Reports his Abilify not working well for him.  Routine safety checks maintained.  Patient is safe on and off the unit.

## 2022-09-10 DIAGNOSIS — F23 Brief psychotic disorder: Secondary | ICD-10-CM | POA: Diagnosis not present

## 2022-09-10 MED ORDER — RISPERIDONE 2 MG PO TABS
2.0000 mg | ORAL_TABLET | Freq: Every day | ORAL | Status: DC
  Administered 2022-09-10: 2 mg via ORAL
  Filled 2022-09-10 (×3): qty 1

## 2022-09-10 MED ORDER — MONTELUKAST SODIUM 10 MG PO TABS
10.0000 mg | ORAL_TABLET | Freq: Every day | ORAL | Status: DC
  Administered 2022-09-10 – 2022-09-17 (×9): 10 mg via ORAL
  Filled 2022-09-10 (×11): qty 1

## 2022-09-10 MED ORDER — TRAZODONE HCL 100 MG PO TABS
200.0000 mg | ORAL_TABLET | Freq: Every day | ORAL | Status: DC
  Administered 2022-09-10 – 2022-09-17 (×8): 200 mg via ORAL
  Filled 2022-09-10 (×10): qty 2

## 2022-09-10 NOTE — Progress Notes (Signed)
   09/10/22 1800  Psych Admission Type (Psych Patients Only)  Admission Status Voluntary  Psychosocial Assessment  Patient Complaints Sleep disturbance  Eye Contact Fair  Facial Expression Flat  Affect Flat  Speech Logical/coherent  Interaction Cautious  Motor Activity Slow  Appearance/Hygiene Unremarkable  Behavior Characteristics Cooperative  Mood Depressed  Thought Process  Coherency WDL  Content Paranoia  Delusions Paranoid  Perception Hallucinations  Hallucination Auditory  Judgment Impaired  Confusion None  Danger to Self  Current suicidal ideation? Denies  Agreement Not to Harm Self Yes  Description of Agreement verbal contract  Danger to Others  Danger to Others None reported or observed

## 2022-09-10 NOTE — Progress Notes (Signed)
[facility] MD Progress Note  09/10/2022 3:15 PM Don Hurst  MRN:  [id number]  Reason for admission: Don Hurst is a 62 year old AA male with no known hx of psychiatric hospitalizations, diagnoses or treatments until now. There were no hx of substance use. Patient is admitted to the [facility] from the [facility] with complaint of new unset of psychosis that comprises of paranoia, delusional thoughts/hallucinations. After the [facility] initial evaluation, patient was transferred to the [facility] for further psychiatric evaluation/treatments. A review of his current lab results has shown an elevated hgba1c 6.1 & toxicology/UDS results were negative of all illegal substances.   Yesterday the psychiatry team made the following recommendations:  -Initiate Risperdal tablet 2 mg p.o. daily at nightly for mood stabilization -Increase trazodone from 100 mg to 200 mg p.o. daily nightly for insomnia  -Continue Prozac to 20 mg once daily for depression, anxiety, and some OCD type symptoms -Discontinue Abilify to 15 mg p.o. daily 09/10/2022.  On assessment today, the pt reports that his mood is depressed and rates depression as #6/10, with 10 being highest severity Reports that anxiety is at a manageable level of #7/10 Nursing staff report patient sleeping over 4.75 hours last night. Appetite is good Concentration is fair.  Observed thought process to be disorganized with tangentiality.  Patient report being sick in 2021 that left his brain medically scared.  Added, still feel paranoid as he people are following me to harm me.  Still feels suspicious of people and life in general.  Reported feeling dizzy and sick, patient taken into the day room for VS to be obtained and for close monitoring by the nursing staff.  Vital signs normal with blood pressure 144/83 and pulse of 89, respiratory rate 16.  Energy level is decreased today.  Increase p.o. fluid encourage and given for hydration  Denies suicidal thoughts.  Denies suicidal intent or  plan.  Denies having any HI.  Denies having psychotic symptoms, however reports hearing voices all around in his head.  Denies having side effects to current psychiatric medications.   Discussed changes to current medication regimen, including discontinuing Abilify 15 mg p.o. daily, initiating Risperdal 2 mg p.o. nightly for psychosis and mood stabilization.  Further discussed increasing trazodone from 100 mg to 200 mg p.o. daily for insomnia.  Don Hurst continues on Prozac  20 mg p.o. for depression, anxiety, and some OCD type symptoms.  Patient in agreement with these adjustments to his medication.       Discussed the following psychosocial stressors: Encouraging to attend therapeutic milieu and unit group activities which could improve his depressive and anxiety symptoms.  Principal Problem: Brief psychotic disorder (HCC) Diagnosis: Principal Problem:   Brief psychotic disorder (HCC) Active Problems:   Psychosis, unspecified psychosis type (HCC)  Total Time spent with patient: 45 minutes  Past Psychiatric History: See H&P  Past Medical History:  Past Medical History:  Diagnosis Date   Allergy    Arthritis    Asthma    GERD (gastroesophageal reflux disease)    History of chronic bronchitis    Multiple allergies    Wears glasses     Past Surgical History:  Procedure Laterality Date   COLONOSCOPY     INGUINAL HERNIA REPAIR  02/2004   rt   INGUINAL HERNIA REPAIR Left 02/06/2016   Procedure: LAPAROSCOPIC LEFT INGUINAL HERNIA REPAIR;  Surgeon: Berna Bue, MD;  Location: [facility];  Service: General;  Laterality: Left;   INSERTION OF MESH Left 02/06/2016  Procedure: INSERTION OF MESH;  Surgeon: Berna Bue, MD;  Location: [facility];  Service: General;  Laterality: Left;   PAROTIDECTOMY Left 02/08/2014   Procedure: LEFT SUPERFICIAL PAROTIDECTOMY;  Surgeon: Flo Shanks, MD;  Location: [facility];  Service: ENT;  Laterality: Left;   Family History:  Family  History  Problem Relation Age of Onset   Hypertension Mother        per pt dx around 89 y o   Cancer Father 32       colon   Eczema Daughter    Hypertension Sister    Family Psychiatric  History: See H&P  Social History:  Social History   Substance and Sexual Activity  Alcohol Use No     Social History   Substance and Sexual Activity  Drug Use No    Social History   Socioeconomic History   Marital status: Married    Spouse name: Not on file   Number of children: Not on file   Years of education: Not on file   Highest education level: Not on file  Occupational History   Not on file  Tobacco Use   Smoking status: Former    Current packs/day: 0.00    Average packs/day: 0.3 packs/day for 3.0 years (0.8 ttl pk-yrs)    Types: Cigarettes    Start date: 03/25/1988    Quit date: 03/26/1991    Years since quitting: 31.4   Smokeless tobacco: Never   Tobacco comments:    smoked 1 pk every 2 wks  Vaping Use   Vaping status: Never Used  Substance and Sexual Activity   Alcohol use: No   Drug use: No   Sexual activity: Yes    Comment: number of sex partners in the last 12 months  1  Other Topics Concern   Not on file  Social History Narrative   Exercise doing weights 4 times per week for 30 minutes   Social Determinants of Health   Financial Resource Strain: Not on file  Food Insecurity: No Food Insecurity (09/05/2022)   Hunger Vital Sign    Worried About Running Out of Food in the Last Year: Never true    Ran Out of Food in the Last Year: Never true  Transportation Needs: No Transportation Needs (09/05/2022)   PRAPARE - Administrator, Civil Service (Medical): No    Lack of Transportation (Non-Medical): No  Physical Activity: Not on file  Stress: Not on file  Social Connections: Not on file   Additional Social History:    Current Medications: Current Facility-Administered Medications  Medication Dose Route Frequency Provider Last Rate Last Admin    acetaminophen (TYLENOL) tablet 650 mg  650 mg Oral Q6H PRN Bobbitt, Shalon E, NP       albuterol (VENTOLIN HFA) 108 (90 Base) MCG/ACT inhaler 1-2 puff  1-2 puff Inhalation Q4H PRN Nwoko, Agnes I, NP       alum & mag hydroxide-simeth (MAALOX/MYLANTA) 200-200-20 MG/5ML suspension 30 mL  30 mL Oral Q4H PRN Bobbitt, Shalon E, NP   30 mL at 09/09/22 2057   atorvastatin (LIPITOR) tablet 20 mg  20 mg Oral Daily Nwoko, Nicole Kindred I, NP   20 mg at 09/10/22 1047   diphenhydrAMINE (BENADRYL) capsule 50 mg  50 mg Oral TID PRN Bobbitt, Shalon E, NP       Or   diphenhydrAMINE (BENADRYL) injection 50 mg  50 mg Intramuscular TID PRN Bobbitt, Franchot Mimes, NP  feeding supplement (ENSURE ENLIVE / ENSURE PLUS) liquid 237 mL  237 mL Oral BID BM Massengill, Nathan, MD   237 mL at 09/06/22 1830   FLUoxetine (PROZAC) capsule 20 mg  20 mg Oral Daily Massengill, Nathan, MD   20 mg at 09/10/22 1408   haloperidol (HALDOL) tablet 5 mg  5 mg Oral TID PRN Bobbitt, Shalon E, NP       Or   haloperidol lactate (HALDOL) injection 5 mg  5 mg Intramuscular TID PRN Bobbitt, Shalon E, NP       hydrOXYzine (ATARAX) tablet 25 mg  25 mg Oral TID PRN Bobbitt, Shalon E, NP   25 mg at 09/09/22 2053   levothyroxine (SYNTHROID) tablet 100 mcg  100 mcg Oral Daily Armandina Stammer I, NP   100 mcg at 09/10/22 1047   loratadine (CLARITIN) tablet 10 mg  10 mg Oral Daily Armandina Stammer I, NP   10 mg at 09/10/22 1048   LORazepam (ATIVAN) tablet 2 mg  2 mg Oral TID PRN Bobbitt, Shalon E, NP       Or   LORazepam (ATIVAN) injection 2 mg  2 mg Intramuscular TID PRN Bobbitt, Shalon E, NP       magnesium hydroxide (MILK OF MAGNESIA) suspension 30 mL  30 mL Oral Daily PRN Bobbitt, Shalon E, NP       mometasone-formoterol (DULERA) 100-5 MCG/ACT inhaler 2 puff  2 puff Inhalation BID Armandina Stammer I, NP   2 puff at 09/10/22 1050   montelukast (SINGULAIR) tablet 10 mg  10 mg Oral QHS Sindy Guadeloupe, NP   10 mg at 09/10/22 0036   omega-3 acid ethyl esters (LOVAZA)  capsule 1 g  1 g Oral Daily Nwoko, Agnes I, NP   1 g at 09/10/22 1048   risperiDONE (RISPERDAL) tablet 2 mg  2 mg Oral QHS Tate Zagal C, FNP       traZODone (DESYREL) tablet 200 mg  200 mg Oral QHS Marvel Sapp, Jesusita Oka, FNP       vitamin D3 (CHOLECALCIFEROL) tablet 1,000 Units  1,000 Units Oral Daily Armandina Stammer I, NP   1,000 Units at 09/10/22 1048    Lab Results: No results found for this or any previous visit (from the past 48 hour(s)).  Blood Alcohol level:  Lab Results  Component Value Date   ETH <10 09/05/2022   Metabolic Disorder Labs: Lab Results  Component Value Date   HGBA1C 6.1 (H) 09/05/2022   MPG 128 09/05/2022   No results found for: "PROLACTIN" Lab Results  Component Value Date   CHOL 126 09/05/2022   TRIG 25 09/05/2022   HDL 52 09/05/2022   CHOLHDL 2.4 09/05/2022   VLDL 5 09/05/2022   LDLCALC 69 09/05/2022   LDLCALC 85 06/17/2022    Physical Findings: AIMS:  , ,  ,  ,    CIWA:    COWS:     Musculoskeletal: Strength & Muscle Tone: within normal limits Gait & Station: normal Patient leans: N/A  Psychiatric Specialty Exam:  Presentation  General Appearance:  Casual  Eye Contact: Fair  Speech: Clear and Coherent  Speech Volume: Normal  Handedness: Right  Mood and Affect  Mood: Anxious; Depressed; Dysphoric  Affect: Congruent  Thought Process  Thought Processes: Disorganized  Descriptions of Associations:Tangential  Orientation:Full (Time, Place and Person)  Thought Content:Paranoid Ideation; Tangential  History of Schizophrenia/Schizoaffective disorder:No  Duration of Psychotic Symptoms:Less than six months  Hallucinations:Hallucinations: Other (comment) (Hearing sounds from everywhere)  Ideas  of Reference:Paranoia  Suicidal Thoughts:Suicidal Thoughts: No  Homicidal Thoughts:Homicidal Thoughts: No  Sensorium  Memory: Recent Fair  Judgment: Other (comment) (Patient appears confused  today.)  Insight: Lacking  Executive Functions  Concentration: Fair  Attention Span: Fair  Recall: Other (comment) (Poor with some confusion)  Fund of Knowledge: Other (comment)  Language: Fair  Psychomotor Activity  Psychomotor Activity: Psychomotor Activity: Normal  Assets  Assets: Communication Skills; Desire for Improvement; Housing; Physical Health; Resilience; Social Support  Sleep  Sleep: Sleep: Fair Number of Hours of Sleep: 4.75  Physical Exam: Physical Exam Vitals reviewed.  Constitutional:      General: He is not in acute distress.    Appearance: He is normal weight. He is not toxic-appearing.  HENT:     Head: Normocephalic.     Mouth/Throat:     Mouth: Mucous membranes are moist.     Pharynx: Oropharynx is clear.  Eyes:     Extraocular Movements: Extraocular movements intact.     Pupils: Pupils are equal, round, and reactive to light.  Cardiovascular:     Rate and Rhythm: Normal rate.     Pulses: Normal pulses.     Comments: Blood pressure 129/90, pulse 107, respiratory rate 24.  Nursing staff to recheck vital signs.  Patient is asymptomatic. Pulmonary:     Effort: Pulmonary effort is normal. No respiratory distress.  Abdominal:     Comments: Deferred  Genitourinary:    Comments: Deferred Musculoskeletal:        General: Normal range of motion.     Cervical back: Normal range of motion.  Skin:    General: Skin is warm.  Neurological:     Mental Status: He is alert.     Motor: No weakness.     Gait: Gait normal.  Psychiatric:        Behavior: Behavior normal.    Review of Systems  Constitutional:  Negative for chills and fever.  Respiratory:  Negative for shortness of breath and wheezing.   Cardiovascular:  Negative for chest pain and palpitations.  Gastrointestinal:  Negative for heartburn and nausea.  Neurological:  Negative for dizziness, tingling, tremors and headaches.  Endo/Heme/Allergies:        See allergy listing   Psychiatric/Behavioral:  Negative for depression, hallucinations, memory loss, substance abuse and suicidal ideas. The patient is nervous/anxious. The patient does not have insomnia.   All other systems reviewed and are negative.  Blood pressure (!) 144/83, pulse 89, temperature 98 F (36.7 C), temperature source Oral, resp. rate 16, height 5\' 10"  (1.778 m), weight 76.6 kg, SpO2 100%. Body mass index is 24.22 kg/m.  Treatment Plan Summary: Daily contact with patient to assess and evaluate symptoms and progress in treatment and Medication management  ASSESSMENT:  Diagnoses / Active Problems: Brief psychotic disorder  PLAN: Safety and Monitoring:  --  Voluntary admission to inpatient psychiatric unit for safety, stabilization and treatment  -- Daily contact with patient to assess and evaluate symptoms and progress in treatment  -- Patient's case to be discussed in multi-disciplinary team meeting  -- Observation Level : q15 minute checks  -- Vital signs:  q12 hours  -- Precautions: suicide, elopement, and assault  2. Psychiatric Diagnoses and Treatment:   -Initiate Risperdal 2 mg p.o. daily at nightly for psychosis -Increase trazodone from 100 mg to 200 mg p.o. daily nightly for insomnia  -Discontinue Abilify to 15 mg p.o. daily 09/10/2022. -Continue Prozac to 20 mg once daily for depression, anxiety, and some  OCD type symptoms  --  The risks/benefits/side-effects/alternatives to this medication were discussed in detail with the patient and time was given for questions. The patient consents to medication trial.    -- Metabolic profile and EKG monitoring obtained while on an atypical antipsychotic (BMI: Lipid Panel: HbgA1c: QTc:)   -- Encouraged patient to participate in unit milieu and in scheduled group therapies     3. Medical Issues Being Addressed:    4. Discharge Planning:   -- Social work and case management to assist with discharge planning and identification of hospital  follow-up needs prior to discharge  -- Estimated LOS: 5-7 days  -- Discharge Concerns: Need to establish a safety plan; Medication compliance and effectiveness  -- Discharge Goals: Return home with outpatient referrals for mental health follow-up including medication management/psychotherapy  Cecilie Lowers, FNP 09/10/2022, 3:15 PM  Patient ID: Don Hurst, male   DOB: 1960-05-18, 62 y.o.   MRN: [id number] Patient ID: Don Hurst, male   DOB: February 08, 1961, 62 y.o.   MRN: [id number]

## 2022-09-10 NOTE — [facility] Group Notes (Signed)
[facility] Group Notes:  (Nursing/MHT/Case Management/Adjunct)  Date:  09/10/2022  Time: 2000  Type of Therapy:   wrap up group  Participation Level:  Active  Participation Quality:  Appropriate and Attentive  Affect:  Blunted and Depressed  Cognitive:  Confused  Insight:  Limited  Engagement in Group:  Developing/Improving  Modes of Intervention:  Clarification, Education, and Support  Summary of Progress/Problems: Positive thinking and positive change were discussed. Pt was unable to share a good experience today but instead without prompting rated his day 0-1. Pt shows considerable thought blocking but does share he would like to be more succinct. Pt is grateful for his family.   Marcille Buffy 09/10/2022, 8:46 PM

## 2022-09-10 NOTE — Progress Notes (Addendum)
Wife called to report about pt's paranoia and delusions. Wife reported that the pt called him after receiving nighttime medications and verbalized that the "staff is in cahoots, the medication that he is taking is poisoned."

## 2022-09-10 NOTE — Progress Notes (Signed)
Patient complaint with medications requested for his Singulair home medications that's he takes every night. Provider notified and medications given. Patient appears very suspicious sitting on his bed most of this evening encouraged to go to bed and reassured. Prn Atarax 25 mg PO given at HS and scheduled 100 mg Trazodone PO with little effect.   Denies SI/HI/A/VH and verbally contracts for safety. Q 15 minutes safety checks ongoing. Patient remains safe.

## 2022-09-10 NOTE — Group Note (Signed)
Date:  09/10/2022 Time:  9:30 AM  Group Topic/Focus:  Goals Group:   The focus of this group is to help patients establish daily goals to achieve during treatment and discuss how the patient can incorporate goal setting into their daily lives to aide in recovery.    Participation Level:  Did Not Attend  Additional Comments:  Pt chose not to attend group.  Donell Beers 09/10/2022, 9:30 AM

## 2022-09-10 NOTE — Plan of Care (Signed)
°  Problem: Activity: Goal: Will verbalize the importance of balancing activity with adequate rest periods Outcome: Progressing   Problem: Education: Goal: Will be free of psychotic symptoms Outcome: Progressing   

## 2022-09-10 NOTE — Group Note (Signed)
Recreation Therapy Group Note   Group Topic:Self-Esteem  Group Date: 09/10/2022 Start Time: 1040 End Time: 1115 Facilitators: Carren Blakley-McCall, LRT,CTRS Location: [address] Dayroom   Goal Area(s) Addresses:  Patient will be able to identify the importance of healthy self esteem. Patient will successfully share benefits of healthy self esteem. Patient will identify how positive self esteem can benefit them post d/c.     Group Description:  Patient and LRT discussed the importance of self esteem. Patients and LRT also discussed what affects the way a person feels about themselves. Pt were to pick one of the blank faces. Patients would then draw on the blank face how they see themselves. Patients were to draw the characteristics on the face. Patients could also use words and other pictures to express their feelings. Patients were given colored pencils, markers and pencils to complete the assignment.    Affect/Mood: Flat   Participation Level: Moderate   Participation Quality: Independent   Behavior: Attentive  and Cooperative   Speech/Thought Process: Relevant   Insight: Fair   Judgement: Fair    Modes of Intervention: Art and Music   Patient Response to Interventions:  Attentive and Receptive   Education Outcome:  Acknowledges education   Clinical Observations/Individualized Feedback: Pt came in late to group. Pt was able to complete the assignment presented in group. Pt identified himself as liking to laugh, learn new things and build things. Pt was pleasant and quiet during group session.     Plan: Continue to engage patient in RT group sessions 2-3x/week.   Jamion Carter-McCall, LRT,CTRS 09/10/2022 1:21 PM

## 2022-09-11 ENCOUNTER — Observation Stay (HOSPITAL_COMMUNITY): Payer: Federal, State, Local not specified - PPO

## 2022-09-11 DIAGNOSIS — R9082 White matter disease, unspecified: Secondary | ICD-10-CM | POA: Diagnosis not present

## 2022-09-11 DIAGNOSIS — F23 Brief psychotic disorder: Secondary | ICD-10-CM | POA: Diagnosis not present

## 2022-09-11 DIAGNOSIS — F29 Unspecified psychosis not due to a substance or known physiological condition: Secondary | ICD-10-CM | POA: Diagnosis not present

## 2022-09-11 LAB — VITAMIN B12: Vitamin B-12: 1929 pg/mL — ABNORMAL HIGH (ref 180–914)

## 2022-09-11 LAB — TSH: TSH: 3.285 u[IU]/mL (ref 0.350–4.500)

## 2022-09-11 LAB — SEDIMENTATION RATE: Sed Rate: 6 mm/hr (ref 0–16)

## 2022-09-11 LAB — HIV ANTIBODY (ROUTINE TESTING W REFLEX): HIV Screen 4th Generation wRfx: NONREACTIVE

## 2022-09-11 MED ORDER — TAMSULOSIN HCL 0.4 MG PO CAPS
0.4000 mg | ORAL_CAPSULE | Freq: Every day | ORAL | Status: DC
  Administered 2022-09-11 – 2022-09-18 (×8): 0.4 mg via ORAL
  Filled 2022-09-11 (×10): qty 1

## 2022-09-11 MED ORDER — LORAZEPAM 0.5 MG PO TABS
0.5000 mg | ORAL_TABLET | Freq: Every day | ORAL | Status: DC
  Administered 2022-09-11 – 2022-09-18 (×8): 0.5 mg via ORAL
  Filled 2022-09-11 (×8): qty 1

## 2022-09-11 MED ORDER — FLUOXETINE HCL 20 MG PO CAPS
40.0000 mg | ORAL_CAPSULE | Freq: Every day | ORAL | Status: DC
  Administered 2022-09-12 – 2022-09-18 (×7): 40 mg via ORAL
  Filled 2022-09-11 (×8): qty 2

## 2022-09-11 MED ORDER — LORAZEPAM 1 MG PO TABS
1.0000 mg | ORAL_TABLET | Freq: Every day | ORAL | Status: DC
  Administered 2022-09-11 – 2022-09-17 (×7): 1 mg via ORAL
  Filled 2022-09-11 (×7): qty 1

## 2022-09-11 MED ORDER — RISPERIDONE 3 MG PO TABS
3.0000 mg | ORAL_TABLET | Freq: Every day | ORAL | Status: DC
  Administered 2022-09-11 – 2022-09-12 (×2): 3 mg via ORAL
  Filled 2022-09-11 (×4): qty 1

## 2022-09-11 NOTE — Group Note (Unsigned)
Occupational Therapy Group Note  Group Topic:Coping Skills  Group Date: 09/11/2022 Start Time: 1430 End Time: 1500 Facilitators: Ted Mcalpine, OT   Group Description: Group encouraged increased engagement and participation through discussion and activity focused on "Coping Ahead." Patients were split up into teams and selected a card from a stack of positive coping strategies. Patients were instructed to act out/charade the coping skill for other peers to guess and receive points for their team. Discussion followed with a focus on identifying additional positive coping strategies and patients shared how they were going to cope ahead over the weekend while continuing hospitalization stay.  Therapeutic Goal(s): Identify positive vs negative coping strategies. Identify coping skills to be used during hospitalization vs coping skills outside of hospital/at home Increase participation in therapeutic group environment and promote engagement in treatment   Participation Level: {OT [facility] Participation GNFAO:13086}   Participation Quality: {OT [facility] Participation Quality:26268}   Behavior: {[facility] OT Group Behavior:26269}   Speech/Thought Process: {[facility] OT Speech/Thought Process:26270}   Affect/Mood: {OT [facility] Affect/Mood:26271}   Insight: {OT [facility] Insight:26272}   Judgement: {OT [facility] Judgement:26272}   Individualization: *** was *** in their participation of group discussion/activity. *** identified  Modes of Intervention: {[facility] MODES OF INTERVENTION:26273}  Patient Response to Interventions:  {[facility] OT Patient Response to Interventions:26274}   Plan: Continue to engage patient in OT groups 2 - 3x/week.  09/11/2022  Ted Mcalpine, OT

## 2022-09-11 NOTE — Group Note (Signed)
Occupational Therapy Group Note  Group Topic:Coping Skills  Group Date: 09/11/2022 Start Time: 1430 End Time: 1500 Facilitators: Ted Mcalpine, OT   Group Description: Group encouraged increased engagement and participation through discussion and activity focused on "Coping Ahead." Patients were split up into teams and selected a card from a stack of positive coping strategies. Patients were instructed to act out/charade the coping skill for other peers to guess and receive points for their team. Discussion followed with a focus on identifying additional positive coping strategies and patients shared how they were going to cope ahead over the weekend while continuing hospitalization stay.  Therapeutic Goal(s): Identify positive vs negative coping strategies. Identify coping skills to be used during hospitalization vs coping skills outside of hospital/at home Increase participation in therapeutic group environment and promote engagement in treatment   Participation Level: Engaged   Participation Quality: Independent   Behavior: Appropriate   Speech/Thought Process: Loose association    Affect/Mood: Appropriate   Insight: Limited   Judgement: Limited      Modes of Intervention: Education  Patient Response to Interventions:  Attentive   Plan: Continue to engage patient in OT groups 2 - 3x/week.  09/11/2022  Ted Mcalpine, OT  Kerrin Champagne, OT

## 2022-09-11 NOTE — Progress Notes (Signed)
   09/10/22 2110  Psych Admission Type (Psych Patients Only)  Admission Status Voluntary  Psychosocial Assessment  Patient Complaints Sleep disturbance;Suspiciousness  Eye Contact Fair  Facial Expression Flat  Affect Flat  Speech Logical/coherent  Interaction Cautious  Motor Activity Slow  Appearance/Hygiene Unremarkable  Behavior Characteristics Cooperative  Mood Depressed;Apprehensive  Thought Process  Coherency WDL  Content Paranoia  Delusions Paranoid  Perception Hallucinations  Hallucination Auditory  Judgment Impaired  Confusion None  Danger to Self  Current suicidal ideation? Denies  Agreement Not to Harm Self Yes  Description of Agreement verbal  Danger to Others  Danger to Others None reported or observed   Pt endorses paranoia. Pt reported concerns about having difficulty urinating, encouraged pt to speak to provider in the AM. Pt was offered support and encouragement. Pt was given scheduled medications. Q 15 minute checks were done for safety. Pt attended group. Pt receptive to treatment and safety maintained on unit.

## 2022-09-11 NOTE — Group Note (Signed)
Date:  09/11/2022 Time:  9:43 AM  Group Topic/Focus:  Goals Group:   The focus of this group is to help patients establish daily goals to achieve during treatment and discuss how the patient can incorporate goal setting into their daily lives to aide in recovery.    Participation Level:  Did Not Attend   Additional Comments:  Pt was aware of group time but chose not to attend.   Donell Beers 09/11/2022, 9:43 AM

## 2022-09-11 NOTE — Group Note (Signed)
Recreation Therapy Group Note   Group Topic:Problem Solving  Group Date: 09/11/2022 Start Time: 1025 End Time: 1052 Facilitators: Garo Heidelberg-McCall, LRT,CTRS Location: [address] Dayroom   Goal Area(s) Addresses:  Patient will effectively work with peer towards shared goal.  Patient will identify skills used to make activity successful.  Patient will identify how skills used during activity can be used to reach post d/c goals.   Group Description: Straw Bridge. In teams of 3-5, patients were given 15 plastic drinking straws and an equal length of masking tape. Using the materials provided, patients were instructed to build a free standing bridge-like structure to suspend an everyday item (ex: puzzle box) off of the floor or table surface. All materials were required to be used by the team in their design. LRT facilitated post-activity discussion reviewing team process. Patients were encouraged to reflect how the skills used in this activity can be generalized to daily life post discharge.   Affect/Mood: Flat   Participation Level: None   Participation Quality: None   Behavior: On-looking and Reserved   Speech/Thought Process: None   Insight: N/A   Judgement: N/A   Modes of Intervention: STEM Activity   Patient Response to Interventions:  Disengaged   Education Outcome:  In group clarification offered    Clinical Observations/Individualized Feedback: Pt was flat and disengaged during group session. Pt did not participate. Pt left but then returned to group session.     Plan: Continue to engage patient in RT group sessions 2-3x/week.   Kayte Borchard-McCall, LRT,CTRS 09/11/2022 12:30 PM

## 2022-09-11 NOTE — Progress Notes (Signed)
[facility] MD Progress Note  09/11/2022 8:48 AM Don Hurst  MRN:  [id number]  Reason for admission: Don Hurst is a 62 year old AA male with no known hx of psychiatric hospitalizations, diagnoses or treatments until now. There were no hx of substance use. Patient is admitted to the [facility] from the [facility] with complaint of new unset of psychosis that comprises of paranoia, delusional thoughts/hallucinations. After the [facility] initial evaluation, patient was transferred to the [facility] for further psychiatric evaluation/treatments. A review of his current lab results has shown an elevated hgba1c 6.1 & toxicology/UDS results were negative of all illegal substances.   Two days ago, the psychiatry team made the following recommendations:  -Initiated Risperdal tablet 2 mg p.o. daily at nightly for mood stabilization -Increased trazodone from 100 mg to 200 mg p.o. daily nightly for insomnia  -Continued Prozac to 20 mg once daily for depression, anxiety, and some OCD type symptoms -Discontinued Abilify to 15 mg p.o. daily 09/10/2022.  Daily notes: Don Hurst is seen in his room, chart reviewed. The chart findings discussed with the treatment team. He presents alert, oriented to self, place & situation. He presents a bit disorganized, paranoid with latency of speech. His speech is clear, however, seems to struggle to put words to his thoughts. He reports, "My wife brought me to the hospital because she noticed that I was seeing things & being paranoid. I'm still feeling a little paranoid & seeing shadows. The paranoia is better today than last week, but the feeling has not gone away. Today, I would rate the paranoia is #6.5. I'm still 50-50 depressed. I did not sleep well last night after a staff woke me up at 3:00 am to ask me questions. My concentration is not all that good because it is off & on. I'm still seeing stuff like shadows from time to time. The suicidal thought is still there, I will say that it is 50-50 if I have to rate  it". Don Hurst currently denies any HI, AH/delusions. He remains very quiet without any behavioral issues. He is visible on the unit, attending group sessions. Staff reports that patient displays paranoid ideations during medication administration times. That he usually will ask questions & checks his medicines over & over prior to taking it. Today during this evaluation, patient complains that the left side of his chest was pulsating, which is also visible to the naked eyes. EKG was ordered & done, result is within norm. Patient is exhibiting symptoms that resembles Schizophrenic condition at age 87. If it is, it will be his first break unless the symptoms has been thoroughly missed all these years by family. To assure adequate evaluation, proper diagnosis/treatment, the attending psychiatrist has ordered series of labs to be done to rule out or confirms other causes of patient's symptoms. And for the mean time, staff will continue current plan of care as already in progress. We have added some ativan for anxiety, increased Risperdal to 3 mg & started patient on Flomax 0.4 mg for BPH symptoms. His Fluoxetine is increased to 40 mg to combat OCD symptoms. See treatment plan below.  Principal Problem: Brief psychotic disorder (HCC) Diagnosis: Principal Problem:   Brief psychotic disorder (HCC) Active Problems:   Psychosis, unspecified psychosis type (HCC)  Total Time spent with patient: 45 minutes  Past Psychiatric History: See H&P  Past Medical History:  Past Medical History:  Diagnosis Date   Allergy    Arthritis    Asthma    GERD (gastroesophageal reflux  disease)    History of chronic bronchitis    Multiple allergies    Wears glasses     Past Surgical History:  Procedure Laterality Date   COLONOSCOPY     INGUINAL HERNIA REPAIR  02/2004   rt   INGUINAL HERNIA REPAIR Left 02/06/2016   Procedure: LAPAROSCOPIC LEFT INGUINAL HERNIA REPAIR;  Surgeon: Berna Bue, MD;  Location: [facility];   Service: General;  Laterality: Left;   INSERTION OF MESH Left 02/06/2016   Procedure: INSERTION OF MESH;  Surgeon: Berna Bue, MD;  Location: [facility];  Service: General;  Laterality: Left;   PAROTIDECTOMY Left 02/08/2014   Procedure: LEFT SUPERFICIAL PAROTIDECTOMY;  Surgeon: Flo Shanks, MD;  Location: [facility];  Service: ENT;  Laterality: Left;   Family History:  Family History  Problem Relation Age of Onset   Hypertension Mother        per pt dx around 55 y o   Cancer Father 67       colon   Eczema Daughter    Hypertension Sister    Family Psychiatric  History: See H&P  Social History:  Social History   Substance and Sexual Activity  Alcohol Use No     Social History   Substance and Sexual Activity  Drug Use No    Social History   Socioeconomic History   Marital status: Married    Spouse name: Not on file   Number of children: Not on file   Years of education: Not on file   Highest education level: Not on file  Occupational History   Not on file  Tobacco Use   Smoking status: Former    Current packs/day: 0.00    Average packs/day: 0.3 packs/day for 3.0 years (0.8 ttl pk-yrs)    Types: Cigarettes    Start date: 03/25/1988    Quit date: 03/26/1991    Years since quitting: 31.4   Smokeless tobacco: Never   Tobacco comments:    smoked 1 pk every 2 wks  Vaping Use   Vaping status: Never Used  Substance and Sexual Activity   Alcohol use: No   Drug use: No   Sexual activity: Yes    Comment: number of sex partners in the last 12 months  1  Other Topics Concern   Not on file  Social History Narrative   Exercise doing weights 4 times per week for 30 minutes   Social Determinants of Health   Financial Resource Strain: Not on file  Food Insecurity: No Food Insecurity (09/05/2022)   Hunger Vital Sign    Worried About Running Out of Food in the Last Year: Never true    Ran Out of Food in the Last Year: Never true  Transportation Needs: No  Transportation Needs (09/05/2022)   PRAPARE - Administrator, Civil Service (Medical): No    Lack of Transportation (Non-Medical): No  Physical Activity: Not on file  Stress: Not on file  Social Connections: Not on file   Additional Social History:    Current Medications: Current Facility-Administered Medications  Medication Dose Route Frequency Provider Last Rate Last Admin   acetaminophen (TYLENOL) tablet 650 mg  650 mg Oral Q6H PRN Bobbitt, Shalon E, NP       albuterol (VENTOLIN HFA) 108 (90 Base) MCG/ACT inhaler 1-2 puff  1-2 puff Inhalation Q4H PRN Vana Arif I, NP       alum & mag hydroxide-simeth (MAALOX/MYLANTA) 200-200-20 MG/5ML suspension 30 mL  30 mL Oral Q4H PRN Bobbitt, Shalon E, NP   30 mL at 09/09/22 2057   atorvastatin (LIPITOR) tablet 20 mg  20 mg Oral Daily Armandina Stammer I, NP   20 mg at 09/11/22 0805   diphenhydrAMINE (BENADRYL) capsule 50 mg  50 mg Oral TID PRN Bobbitt, Shalon E, NP       Or   diphenhydrAMINE (BENADRYL) injection 50 mg  50 mg Intramuscular TID PRN Bobbitt, Shalon E, NP       feeding supplement (ENSURE ENLIVE / ENSURE PLUS) liquid 237 mL  237 mL Oral BID BM Massengill, Harrold Donath, MD   237 mL at 09/06/22 1830   FLUoxetine (PROZAC) capsule 20 mg  20 mg Oral Daily Massengill, Nathan, MD   20 mg at 09/11/22 1610   haloperidol (HALDOL) tablet 5 mg  5 mg Oral TID PRN Bobbitt, Shalon E, NP       Or   haloperidol lactate (HALDOL) injection 5 mg  5 mg Intramuscular TID PRN Bobbitt, Shalon E, NP       hydrOXYzine (ATARAX) tablet 25 mg  25 mg Oral TID PRN Bobbitt, Shalon E, NP   25 mg at 09/09/22 2053   levothyroxine (SYNTHROID) tablet 100 mcg  100 mcg Oral Daily Armandina Stammer I, NP   100 mcg at 09/11/22 0805   loratadine (CLARITIN) tablet 10 mg  10 mg Oral Daily Armandina Stammer I, NP   10 mg at 09/11/22 0806   LORazepam (ATIVAN) tablet 2 mg  2 mg Oral TID PRN Bobbitt, Shalon E, NP       Or   LORazepam (ATIVAN) injection 2 mg  2 mg Intramuscular TID PRN  Bobbitt, Shalon E, NP       magnesium hydroxide (MILK OF MAGNESIA) suspension 30 mL  30 mL Oral Daily PRN Bobbitt, Shalon E, NP       mometasone-formoterol (DULERA) 100-5 MCG/ACT inhaler 2 puff  2 puff Inhalation BID Armandina Stammer I, NP   2 puff at 09/11/22 0806   montelukast (SINGULAIR) tablet 10 mg  10 mg Oral QHS Sindy Guadeloupe, NP   10 mg at 09/10/22 2108   omega-3 acid ethyl esters (LOVAZA) capsule 1 g  1 g Oral Daily Tinzley Dalia, Nicole Kindred I, NP   1 g at 09/11/22 0804   risperiDONE (RISPERDAL) tablet 2 mg  2 mg Oral QHS Ntuen, Tina C, FNP   2 mg at 09/10/22 2108   traZODone (DESYREL) tablet 200 mg  200 mg Oral QHS Ntuen, Tina C, FNP   200 mg at 09/10/22 2108   vitamin D3 (CHOLECALCIFEROL) tablet 1,000 Units  1,000 Units Oral Daily Armandina Stammer I, NP   1,000 Units at 09/11/22 0804   Lab Results: No results found for this or any previous visit (from the past 48 hour(s)).  Blood Alcohol level:  Lab Results  Component Value Date   ETH <10 09/05/2022   Metabolic Disorder Labs: Lab Results  Component Value Date   HGBA1C 6.1 (H) 09/05/2022   MPG 128 09/05/2022   No results found for: "PROLACTIN" Lab Results  Component Value Date   CHOL 126 09/05/2022   TRIG 25 09/05/2022   HDL 52 09/05/2022   CHOLHDL 2.4 09/05/2022   VLDL 5 09/05/2022   LDLCALC 69 09/05/2022   LDLCALC 85 06/17/2022   Physical Findings: AIMS:  , ,  ,  ,    CIWA:    COWS:     Musculoskeletal: Strength & Muscle Tone: within normal limits Gait &  Station: normal Patient leans: N/A  Psychiatric Specialty Exam:  Presentation  General Appearance:  Casual  Eye Contact: Fair  Speech: Clear and Coherent  Speech Volume: Normal  Handedness: Right  Mood and Affect  Mood: Anxious; Depressed; Dysphoric  Affect: Congruent  Thought Process  Thought Processes: Disorganized  Descriptions of Associations:Tangential  Orientation:Full (Time, Place and Person)  Thought Content:Paranoid Ideation;  Tangential  History of Schizophrenia/Schizoaffective disorder:No  Duration of Psychotic Symptoms:Less than six months  Hallucinations:Hallucinations: Other (comment) (Hearing sounds from everywhere)  Ideas of Reference:Paranoia  Suicidal Thoughts:Suicidal Thoughts: No  Homicidal Thoughts:Homicidal Thoughts: No  Sensorium  Memory: Recent Fair  Judgment: Other (comment) (Patient appears confused today.)  Insight: Lacking  Executive Functions  Concentration: Fair  Attention Span: Fair  Recall: Other (comment) (Poor with some confusion)  Fund of Knowledge: Other (comment)  Language: Fair  Psychomotor Activity  Psychomotor Activity: Psychomotor Activity: Normal  Assets  Assets: Communication Skills; Desire for Improvement; Housing; Physical Health; Resilience; Social Support  Sleep  Sleep: Sleep: Fair Number of Hours of Sleep: 4.75  Physical Exam: Physical Exam Vitals reviewed.  Constitutional:      General: He is not in acute distress.    Appearance: He is normal weight. He is not toxic-appearing.  HENT:     Head: Normocephalic.     Mouth/Throat:     Mouth: Mucous membranes are moist.     Pharynx: Oropharynx is clear.  Eyes:     Extraocular Movements: Extraocular movements intact.     Pupils: Pupils are equal, round, and reactive to light.  Cardiovascular:     Rate and Rhythm: Normal rate.     Pulses: Normal pulses.  Pulmonary:     Effort: Pulmonary effort is normal. No respiratory distress.  Abdominal:     Comments: Deferred  Genitourinary:    Comments: Patient presents BHP symptoms.  Started on Flomax 0.4 mg. Musculoskeletal:        General: Normal range of motion.     Cervical back: Normal range of motion.  Skin:    General: Skin is warm.  Neurological:     Mental Status: He is alert.     Motor: No weakness.     Gait: Gait normal.  Psychiatric:        Behavior: Behavior normal.    Review of Systems  Constitutional:  Negative  for chills and fever.  Respiratory:  Negative for shortness of breath and wheezing.   Cardiovascular:  Negative for chest pain and palpitations.  Gastrointestinal:  Negative for heartburn and nausea.  Neurological:  Negative for dizziness, tingling, tremors and headaches.  Endo/Heme/Allergies:        See allergy listing  Psychiatric/Behavioral:  Negative for depression, hallucinations, memory loss, substance abuse and suicidal ideas. The patient is nervous/anxious. The patient does not have insomnia.   All other systems reviewed and are negative.  Blood pressure 124/79, pulse (!) 101, temperature 98.5 F (36.9 C), temperature source Oral, resp. rate 16, height 5\' 10"  (1.778 m), weight 76.6 kg, SpO2 100%. Body mass index is 24.22 kg/m.  Treatment Plan Summary: Daily contact with patient to assess and evaluate symptoms and progress in treatment and Medication management  Continue inpatient hospitalization.  Will continue today 09/11/2022 plan as below except where it is noted.   Diagnoses / Active Problems: Brief psychotic disorder.  Unspecified psychotic disorder. Obsessive compulsive disorder.  PLAN:The risks/benefits/side-effects/alternatives to the medications in use were discussed in detail with the patient and time was given for patient's  questions. The patient consents to medication trial. -Increased  Risperdal to 3 mg po Q hs for psychosis -Continue Trazodone 200 mg po Q hs for insomnia  -Discontinued Abilify to 15 mg p.o. daily 09/10/2022. -Increased Prozac to 40 mg  po for  dep/anxiety/OCD type symptoms.  -Initiated Ativan 0.5 mg po daily for anxiety.  -Initiated Ativan 1 mg po daily for anxiety/insomnia.  Agitation protocols: Cont as recommended;  -Benadryl 50 mg po or IM tid prn. -Haldol 5 mg po or IM tid prn.  -Lorazepam 2 mg po or IM tid prn.   Lab ordered: ANA w/reflex if positive, Ceruloplasmin, Heavy metal profile (urine), HIV antibody (routine w/rflx), RRR,  Sedimentation rate, TSH, Vit. B12.  Other medical conditions.  -Continue Albuterol inhaler 1-2 puffs Q 4 hrs prn for SOB.  -Continue Dulera 2 puffs bid for asthma. -Continue atorvastatin 20 mg po Q 1800 for hyperlipidemia.  -Continue Synthroid 100 mcg po daily for hypothyroidism.  -Continue Claritin 10 mg po daily for allergies.  -Continue Vit D3 1,000 units po daily for bone health.  -Continue Omega -3 acid 1 gm po daily for hyperlipidemia.  -Continue Singulair 10 mg po every day for breathing issues. -Initiated Flomax 0.4 mg po every day for BPH symptoms.   Other PRNS -Continue Tylenol 650 mg every 6 hours PRN for mild pain -Continue Maalox 30 ml Q 4 hrs PRN for indigestion -Continue MOM 30 ml po Q 6 hrs for constipation   Safety and Monitoring: Voluntary admission to inpatient psychiatric unit for safety, stabilization and treatment Daily contact with patient to assess and evaluate symptoms and progress in treatment Patient's case to be discussed in multi-disciplinary team meeting Observation Level : q15 minute checks Vital signs: q12 hours Precautions: Safety   Discharge Planning: Social work and case management to assist with discharge planning and identification of hospital follow-up needs prior to discharge Estimated LOS: 5-7 days Discharge Concerns: Need to establish a safety plan; Medication compliance and effectiveness Discharge Goals: Return home with outpatient referrals for mental health follow-up including medication management/psychotherapy  Armandina Stammer, NP, pmhnp, fnp-bc. 09/11/2022, 8:48 AM Patient ID: Stormy Card, male   DOB: 04-Feb-1961, 62 y.o.   MRN: [id number] Patient ID: Timm Bonenberger, male   DOB: [birth year], 62 y.o.   MRN: [id number] Patient ID: Evo Aderman, male   DOB: 1960-12-28, 62 y.o.   MRN: [id number]

## 2022-09-11 NOTE — Progress Notes (Signed)
   09/11/22 0600  15 Minute Checks  Location Bedroom  Visual Appearance Calm  Behavior Composed  Sleep (Behavioral Health Patients Only)  Calculate sleep? (Click Yes once per 24 hr at 0600 safety check) Yes  Documented sleep last 24 hours 3.5

## 2022-09-11 NOTE — Progress Notes (Signed)
   09/11/22 1600  Psych Admission Type (Psych Patients Only)  Admission Status Voluntary  Psychosocial Assessment  Patient Complaints Sleep disturbance;Suspiciousness  Eye Contact Fair  Facial Expression Flat  Affect Flat  Speech Logical/coherent  Interaction Cautious  Motor Activity Slow  Appearance/Hygiene Unremarkable  Behavior Characteristics Cooperative  Mood Depressed;Apprehensive  Thought Process  Coherency WDL  Content Paranoia  Delusions Paranoid  Perception Hallucinations  Hallucination Auditory  Judgment Impaired  Confusion None  Danger to Self  Current suicidal ideation? Denies  Agreement Not to Harm Self Yes  Description of Agreement verbal  Danger to Others  Danger to Others None reported or observed

## 2022-09-11 NOTE — Progress Notes (Signed)
Pt currently off the unit en route to [facility] for CT scan.

## 2022-09-11 NOTE — [facility] Group Notes (Signed)
[facility] Group Notes:  (Nursing/MHT/Case Management/Adjunct)  Date:  09/11/2022  Time:  2015  Type of Therapy:   Wrap up group  Participation Level:  Active  Participation Quality:  Attentive, Sharing, and Supportive  Affect:  Blunted  Cognitive:   thought blocking  Insight:  Limited  Engagement in Group:  Developing/Improving  Modes of Intervention:  Clarification, Education, and Socialization  Summary of Progress/Problems: Positive thinking and self-care were discussed.   Marcille Buffy 09/11/2022, 8:59 PM

## 2022-09-11 NOTE — Plan of Care (Signed)
  Problem: Safety: Goal: Ability to remain free from injury will improve Outcome: Progressing   Problem: Self-Concept: Goal: Will verbalize positive feelings about self Outcome: Progressing

## 2022-09-11 NOTE — Progress Notes (Signed)
Pt is currently back on the unit.

## 2022-09-12 ENCOUNTER — Encounter (HOSPITAL_COMMUNITY): Payer: Self-pay

## 2022-09-12 DIAGNOSIS — F23 Brief psychotic disorder: Secondary | ICD-10-CM | POA: Diagnosis not present

## 2022-09-12 MED ORDER — PROPRANOLOL HCL 10 MG PO TABS
10.0000 mg | ORAL_TABLET | Freq: Two times a day (BID) | ORAL | Status: DC
  Administered 2022-09-12 – 2022-09-18 (×12): 10 mg via ORAL
  Filled 2022-09-12 (×15): qty 1

## 2022-09-12 MED ORDER — MAGNESIUM CITRATE PO SOLN
1.0000 | Freq: Once | ORAL | Status: AC
  Administered 2022-09-12: 1 via ORAL
  Filled 2022-09-12 (×2): qty 296

## 2022-09-12 NOTE — [facility] Group Notes (Signed)
  Spirituality group facilitated by Kathleen Argue, B[facility].  Group Description: Group focused on topic of hope. Patients participated in facilitated discussion around topic, connecting with one another around experiences and definitions for hope.  Due to lack of participants, group was ended early.   Modalities: Psycho-social ed, Adlerian, Narrative, MI  Patient Progress: Did not attend.

## 2022-09-12 NOTE — Progress Notes (Signed)
[facility] MD Progress Note  09/12/2022 4:39 PM Don Hurst  MRN:  [id number]  Reason for admission: Don Hurst is a 62 year old AA male with no known hx of psychiatric hospitalizations, diagnoses or treatments until now. There were no hx of substance use. Patient is admitted to the [facility] from the [facility] with complaint of new unset of psychosis that comprises of paranoia, delusional thoughts/hallucinations. After the [facility] initial evaluation, patient was transferred to the [name] for further psychiatric evaluation/treatments. A review of his current lab results has shown an elevated hgba1c 6.1 & toxicology/UDS results were negative of all illegal substances.   Daily notes: Don Hurst is seen in his room. He presents alert, oriented & aware of situation. He is visible on the unit, attending group sessions. He is making a good eye contact & verbally responsive. He reports, "Hurst feel a little weak today (physically). But my mood is okay & emotionally, Hurst'm 50-50. And for the paranoia, it is there, but better. Hurst give it #6.5. Hurst slept & rested well last night. Hurst'm kind of curious about the questions the psychiatrist asked me when Hurst first got here. Will those questions go to my insurance company?" Don Hurst continues to endorse passive SI. Denies any plans or intent to hurt himself. He denies any HI, AVH & delusional thoughts. He does not appear to be responding to any internal stimuli. Patient is explained that anytime the psychiatrist or other members of the treatment team asked him any questions. It is only to improve his care, assess how he is doing on his treatment regimen & to assure that he is generally doing okay or tolerating his treatment regimen. This provider also talked to the patient's wife Don Hurst. She was enquiring the reasons for the changes made on the patient treatment regimen. She enquired that when at the ED, that her husband was started on olanzapine which was later changed to Abilify. And Two days ago,  Abilify was stopped & patient is now on Risperdal. It was explained to Ms. Raffel that, the olanzapine was stopped because patient is already diagnosed with borderline diabetes mellitus by his primary care physician. And because olanzapine has the tendency to spike-up blood sugar, may end up making the borderline diabetes worse. However, when started on Abilify which has a better profile on diabetes than olanzapine, patient's symptoms were not responding well to it. As a result, patient was recently switched to Risperdal which is on the early stage of treatment. Patient's wife is acceptable to the explanation given. Don Hurst complained of constipation, a one time dose of 1 bottle of magnesium citrate ordered. Patient is started on propranolol for elevated heart rate. Will continue as already in progress. Discussed this case with the attending psychiatrist. Will continue as already in progress.  Principal Problem: Brief psychotic disorder (HCC) Diagnosis: Principal Problem:   Brief psychotic disorder (HCC) Active Problems:   Psychosis, unspecified psychosis type (HCC)  Total Time spent with patient: 45 minutes  Past Psychiatric History: See Hurst&P  Past Medical History:  Past Medical History:  Diagnosis Date   Allergy    Arthritis    Asthma    GERD (gastroesophageal reflux disease)    History of chronic bronchitis    Multiple allergies    Wears glasses     Past Surgical History:  Procedure Laterality Date   COLONOSCOPY     INGUINAL HERNIA REPAIR  02/2004   rt   INGUINAL HERNIA REPAIR Left 02/06/2016   Procedure: LAPAROSCOPIC  LEFT INGUINAL HERNIA REPAIR;  Surgeon: Berna Bue, MD;  Location: [facility];  Service: General;  Laterality: Left;   INSERTION OF MESH Left 02/06/2016   Procedure: INSERTION OF MESH;  Surgeon: Berna Bue, MD;  Location: [facility];  Service: General;  Laterality: Left;   PAROTIDECTOMY Left 02/08/2014   Procedure: LEFT SUPERFICIAL PAROTIDECTOMY;  Surgeon: Flo Shanks, MD;  Location: [facility];  Service: ENT;  Laterality: Left;   Family History:  Family History  Problem Relation Age of Onset   Hypertension Mother        per pt dx around 17 y o   Cancer Father 23       colon   Eczema Daughter    Hypertension Sister    Family Psychiatric  History: See Hurst&P  Social History:  Social History   Substance and Sexual Activity  Alcohol Use No     Social History   Substance and Sexual Activity  Drug Use No    Social History   Socioeconomic History   Marital status: Married    Spouse name: Not on file   Number of children: Not on file   Years of education: Not on file   Highest education level: Not on file  Occupational History   Not on file  Tobacco Use   Smoking status: Former    Current packs/day: 0.00    Average packs/day: 0.3 packs/day for 3.0 years (0.8 ttl pk-yrs)    Types: Cigarettes    Start date: 03/25/1988    Quit date: 03/26/1991    Years since quitting: 31.4   Smokeless tobacco: Never   Tobacco comments:    smoked 1 pk every 2 wks  Vaping Use   Vaping status: Never Used  Substance and Sexual Activity   Alcohol use: No   Drug use: No   Sexual activity: Yes    Comment: number of sex partners in the last 12 months  1  Other Topics Concern   Not on file  Social History Narrative   Exercise doing weights 4 times per week for 30 minutes   Social Determinants of Health   Financial Resource Strain: Not on file  Food Insecurity: No Food Insecurity (09/05/2022)   Hunger Vital Sign    Worried About Running Out of Food in the Last Year: Never true    Ran Out of Food in the Last Year: Never true  Transportation Needs: No Transportation Needs (09/05/2022)   PRAPARE - Administrator, Civil Service (Medical): No    Lack of Transportation (Non-Medical): No  Physical Activity: Not on file  Stress: Not on file  Social Connections: Not on file   Additional Social History:    Current  Medications: Current Facility-Administered Medications  Medication Dose Route Frequency Provider Last Rate Last Admin   acetaminophen (TYLENOL) tablet 650 mg  650 mg Oral Q6H PRN Don Hurst, Don E, NP       albuterol (VENTOLIN HFA) 108 (90 Base) MCG/ACT inhaler 1-2 puff  1-2 puff Inhalation Q4H PRN Don Hurst I, NP       alum & mag hydroxide-simeth (MAALOX/MYLANTA) 200-200-20 MG/5ML suspension 30 mL  30 mL Oral Q4H PRN Don Hurst, Don E, NP   30 mL at 09/09/22 2057   atorvastatin (LIPITOR) tablet 20 mg  20 mg Oral Daily Don Stammer I, NP   20 mg at 09/12/22 0836   diphenhydrAMINE (BENADRYL) capsule 50 mg  50 mg Oral TID PRN Don Hurst  E, NP       Or   diphenhydrAMINE (BENADRYL) injection 50 mg  50 mg Intramuscular TID PRN Don Hurst, Don E, NP       feeding supplement (ENSURE ENLIVE / ENSURE PLUS) liquid 237 mL  237 mL Oral BID BM Don Hurst, Nathan, MD   237 mL at 09/06/22 1830   FLUoxetine (PROZAC) capsule 40 mg  40 mg Oral Daily Don Hurst, Nathan, MD   40 mg at 09/12/22 0836   haloperidol (HALDOL) tablet 5 mg  5 mg Oral TID PRN Don Hurst, Don E, NP       Or   haloperidol lactate (HALDOL) injection 5 mg  5 mg Intramuscular TID PRN Don Hurst, Don E, NP       hydrOXYzine (ATARAX) tablet 25 mg  25 mg Oral TID PRN Don Hurst, Don E, NP   25 mg at 09/09/22 2053   levothyroxine (SYNTHROID) tablet 100 mcg  100 mcg Oral Daily Don Stammer I, NP   100 mcg at 09/12/22 0836   loratadine (CLARITIN) tablet 10 mg  10 mg Oral Daily Don Stammer I, NP   10 mg at 09/12/22 0836   LORazepam (ATIVAN) tablet 2 mg  2 mg Oral TID PRN Don Hurst, Don E, NP       Or   LORazepam (ATIVAN) injection 2 mg  2 mg Intramuscular TID PRN Don Hurst, Don E, NP       LORazepam (ATIVAN) tablet 0.5 mg  0.5 mg Oral Daily Don Hurst, Nathan, MD   0.5 mg at 09/12/22 0839   LORazepam (ATIVAN) tablet 1 mg  1 mg Oral QHS Don Hurst, Nathan, MD   1 mg at 09/11/22 2006   magnesium citrate solution 1 Bottle  1 Bottle Oral Once  Don Hurst, Don Kindred I, NP       magnesium hydroxide (MILK OF MAGNESIA) suspension 30 mL  30 mL Oral Daily PRN Don Hurst, Don E, NP       mometasone-formoterol (DULERA) 100-5 MCG/ACT inhaler 2 puff  2 puff Inhalation BID Don Stammer I, NP   2 puff at 09/12/22 0837   montelukast (SINGULAIR) tablet 10 mg  10 mg Oral QHS Don Guadeloupe, NP   10 mg at 09/11/22 2007   omega-3 acid ethyl esters (LOVAZA) capsule 1 g  1 g Oral Daily Don Harkless, Don Kindred I, NP   1 g at 09/12/22 0836   propranolol (INDERAL) tablet 10 mg  10 mg Oral Q12H Don Hurst, Don Donath, MD   10 mg at 09/12/22 0836   risperiDONE (RISPERDAL) tablet 3 mg  3 mg Oral QHS Don Hurst, Nathan, MD   3 mg at 09/11/22 2007   tamsulosin (FLOMAX) capsule 0.4 mg  0.4 mg Oral QPC breakfast Don Hurst, Don Donath, MD   0.4 mg at 09/12/22 0840   traZODone (DESYREL) tablet 200 mg  200 mg Oral QHS Don Hurst, Don C, FNP   200 mg at 09/11/22 2006   vitamin D3 (CHOLECALCIFEROL) tablet 1,000 Units  1,000 Units Oral Daily Don Stammer I, NP   1,000 Units at 09/12/22 1610   Lab Results:  Results for orders placed or performed during the hospital encounter of 09/05/22 (from the past 48 hour(s))  TSH     Status: None   Collection Time: 09/11/22  7:01 PM  Result Value Ref Range   TSH 3.285 0.350 - 4.500 uIU/mL    Comment: Performed by a 3rd Generation assay with a functional sensitivity of <=0.01 uIU/mL. Performed at [facility], [address] [place], [place] [address]  Sedimentation rate     Status: None   Collection Time: 09/11/22  7:01 PM  Result Value Ref Range   Sed Rate 6 0 - 16 mm/hr    Comment: Performed at [facility], [address] [place], [place] [address]  HIV Antibody (routine testing w rflx)     Status: None   Collection Time: 09/11/22  7:01 PM  Result Value Ref Range   HIV Screen 4th Generation wRfx Non Reactive Non Reactive    Comment: Performed at [facility], [address], [place], [place] [address]   Vitamin B12     Status: Abnormal   Collection Time: 09/11/22  7:01 PM  Result Value Ref Range   Vitamin B-12 1,929 (Hurst) 180 - 914 pg/mL    Comment: RESULT CONFIRMED BY MANUAL DILUTION (NOTE) This assay is not validated for testing neonatal or myeloproliferative syndrome specimens for Vitamin B12 levels. Performed at [facility], [address] [place], [place] [address]     Blood Alcohol level:  Lab Results  Component Value Date   ETH <10 09/05/2022   Metabolic Disorder Labs: Lab Results  Component Value Date   HGBA1C 6.1 (Hurst) 09/05/2022   MPG 128 09/05/2022   No results found for: "PROLACTIN" Lab Results  Component Value Date   CHOL 126 09/05/2022   TRIG 25 09/05/2022   HDL 52 09/05/2022   CHOLHDL 2.4 09/05/2022   VLDL 5 09/05/2022   LDLCALC 69 09/05/2022   LDLCALC 85 06/17/2022   Physical Findings: AIMS:  , ,  ,  ,    CIWA:    COWS:     Musculoskeletal: Strength & Muscle Tone: within normal limits Gait & Station: normal Patient leans: N/A  Psychiatric Specialty Exam:  Presentation  General Appearance:  Casual  Eye Contact: Fair  Speech: Clear and Coherent  Speech Volume: Normal  Handedness: Right  Mood and Affect  Mood: Anxious; Depressed; Dysphoric  Affect: Congruent  Thought Process  Thought Processes: Disorganized  Descriptions of Associations:Tangential  Orientation:Full (Time, Place and Person)  Thought Content:Paranoid Ideation; Tangential  History of Schizophrenia/Schizoaffective disorder:No  Duration of Psychotic Symptoms:Less than six months  Hallucinations:No data recorded  Ideas of Reference:Paranoia  Suicidal Thoughts:No data recorded  Homicidal Thoughts:No data recorded  Sensorium  Memory: Recent Fair  Judgment: Other (comment) (Patient appears confused today.)  Insight: Lacking  Executive Functions  Concentration: Fair  Attention Span: Fair  Recall: Other (comment) (Poor  with some confusion)  Fund of Knowledge: Other (comment)  Language: Fair  Psychomotor Activity  Psychomotor Activity: No data recorded  Assets  Assets: Communication Skills; Desire for Improvement; Housing; Physical Health; Resilience; Social Support  Sleep  Sleep: No data recorded  Physical Exam: Physical Exam Vitals reviewed.  Constitutional:      General: He is not in acute distress.    Appearance: He is normal weight. He is not toxic-appearing.  HENT:     Head: Normocephalic.     Mouth/Throat:     Mouth: Mucous membranes are moist.     Pharynx: Oropharynx is clear.  Eyes:     Extraocular Movements: Extraocular movements intact.     Pupils: Pupils are equal, round, and reactive to light.  Cardiovascular:     Rate and Rhythm: Normal rate.     Pulses: Normal pulses.  Pulmonary:     Effort: Pulmonary effort is normal. No respiratory distress.  Abdominal:     Comments: Deferred  Genitourinary:  Comments: Patient presents BHP symptoms.  Started on Flomax 0.4 mg. Musculoskeletal:        General: Normal range of motion.     Cervical back: Normal range of motion.  Skin:    General: Skin is warm.  Neurological:     Mental Status: He is alert.     Motor: No weakness.     Gait: Gait normal.  Psychiatric:        Behavior: Behavior normal.    Review of Systems  Constitutional:  Negative for chills and fever.  Respiratory:  Negative for shortness of breath and wheezing.   Cardiovascular:  Negative for chest pain and palpitations.  Gastrointestinal:  Negative for heartburn and nausea.  Neurological:  Negative for dizziness, tingling, tremors and headaches.  Endo/Heme/Allergies:        See allergy listing  Psychiatric/Behavioral:  Negative for depression, hallucinations, memory loss, substance abuse and suicidal ideas. The patient is nervous/anxious. The patient does not have insomnia.   All other systems reviewed and are negative.  Blood pressure 115/71,  pulse 81, temperature 98.6 F (37 Hurst), temperature source Oral, resp. rate 12, height 5\' 10"  (1.778 m), weight 76.6 kg, SpO2 99%. Body mass index is 24.22 kg/m.  Treatment Plan Summary: Daily contact with patient to assess and evaluate symptoms and progress in treatment and Medication management  Continue inpatient hospitalization.  Will continue today 09/12/2022 plan as below except where it is noted.   Diagnoses / Active Problems: Brief psychotic disorder.  Unspecified psychotic disorder. Obsessive compulsive disorder.  PLAN:The risks/benefits/side-effects/alternatives to the medications in use were discussed in detail with the patient and time was given for patient's questions. The patient consents to medication trial. -Continue Risperdal 3 mg po Q hs for psychosis -Continue Trazodone 200 mg po Q hs for insomnia  -Discontinued Abilify to 15 mg p.o. daily 09/10/2022. -Continue Prozac 40 mg  po for  dep/anxiety/OCD type symptoms.  -Continue Ativan 0.5 mg po daily for anxiety.  -Continue Ativan 1 mg po daily for anxiety/insomnia.  -Initiated Propranolol 10 mg po bid for elevated heart rate.  Agitation protocols: Cont as recommended;  -Benadryl 50 mg po or IM tid prn. -Haldol 5 mg po or IM tid prn.  -Lorazepam 2 mg po or IM tid prn.   Lab ordered: ANA w/reflex if positive, Ceruloplasmin, Heavy metal profile (urine), HIV antibody (routine w/rflx), RRR, Sedimentation rate, TSH, Vit. B12.  Other medical conditions.  -Continue Albuterol inhaler 1-2 puffs Q 4 hrs prn for SOB.  -Continue Dulera 2 puffs bid for asthma. -Continue atorvastatin 20 mg po Q 1800 for hyperlipidemia.  -Continue Synthroid 100 mcg po daily for hypothyroidism.  -Continue Claritin 10 mg po daily for allergies.  -Continue Vit D3 1,000 units po daily for bone health.  -Continue Omega -3 acid 1 gm po daily for hyperlipidemia.  -Continue Singulair 10 mg po every day for breathing issues. -Continue Flomax 0.4 mg po  every day for BPH symptoms.   Other PRNS -Continue Tylenol 650 mg every 6 hours PRN for mild pain -Continue Maalox 30 ml Q 4 hrs PRN for indigestion -Continue MOM 30 ml po Q 6 hrs for constipation.  -Give magnesium citrate 1 bottle once today for constipation.   Safety and Monitoring: Voluntary admission to inpatient psychiatric unit for safety, stabilization and treatment Daily contact with patient to assess and evaluate symptoms and progress in treatment Patient's case to be discussed in multi-disciplinary team meeting Observation Level : q15 minute checks Vital signs:  q12 hours Precautions: Safety   Discharge Planning: Social work and case management to assist with discharge planning and identification of hospital follow-up needs prior to discharge Estimated LOS: 5-7 days Discharge Concerns: Need to establish a safety plan; Medication compliance and effectiveness Discharge Goals: Return home with outpatient referrals for mental health follow-up including medication management/psychotherapy  Don Stammer, NP, pmhnp, fnp-bc. 09/12/2022, 4:39 PM Patient ID: Stormy Card, male   DOB: 04/30/1960, 62 y.o.   MRN: [id number] Patient ID: Quantay Zaremba, male   DOB: [birth year], 62 y.o.   MRN: [id number] Patient ID: Denton Derks, male   DOB: 08/15/1960, 62 y.o.   MRN: [id number] Patient ID: Stephanie Mcglone, male   DOB: Feb 14, 1961, 62 y.o.   MRN: [id number]

## 2022-09-12 NOTE — Progress Notes (Signed)
   09/12/22 0836  Psych Admission Type (Psych Patients Only)  Admission Status Voluntary  Psychosocial Assessment  Patient Complaints None  Eye Contact Fair  Facial Expression Flat  Affect Flat  Speech Logical/coherent  Interaction Minimal  Motor Activity Slow  Appearance/Hygiene Unremarkable  Behavior Characteristics Cooperative  Mood Depressed  Thought Process  Coherency WDL  Content WDL  Delusions None reported or observed  Perception Hallucinations  Hallucination Auditory  Judgment Impaired  Confusion None  Danger to Self  Current suicidal ideation? Denies  Agreement Not to Harm Self Yes  Description of Agreement verbal  Danger to Others  Danger to Others None reported or observed

## 2022-09-12 NOTE — BH IP Treatment Plan (Signed)
Interdisciplinary Treatment and Diagnostic Plan Update  09/12/2022 Time of Session: 1321 Don Hurst MRN: [id number]  Principal Diagnosis: Brief psychotic disorder [facility])  Secondary Diagnoses: Principal Problem:   Brief psychotic disorder (HCC) Active Problems:   Psychosis, unspecified psychosis type (HCC)   Current Medications:  Current Facility-Administered Medications  Medication Dose Route Frequency Provider Last Rate Last Admin   acetaminophen (TYLENOL) tablet 650 mg  650 mg Oral Q6H PRN Bobbitt, Shalon E, NP       albuterol (VENTOLIN HFA) 108 (90 Base) MCG/ACT inhaler 1-2 puff  1-2 puff Inhalation Q4H PRN Nwoko, Agnes I, NP       alum & mag hydroxide-simeth (MAALOX/MYLANTA) 200-200-20 MG/5ML suspension 30 mL  30 mL Oral Q4H PRN Bobbitt, Shalon E, NP   30 mL at 09/09/22 2057   atorvastatin (LIPITOR) tablet 20 mg  20 mg Oral Daily Nwoko, Nicole Kindred I, NP   20 mg at 09/12/22 0836   diphenhydrAMINE (BENADRYL) capsule 50 mg  50 mg Oral TID PRN Bobbitt, Shalon E, NP       Or   diphenhydrAMINE (BENADRYL) injection 50 mg  50 mg Intramuscular TID PRN Bobbitt, Shalon E, NP       feeding supplement (ENSURE ENLIVE / ENSURE PLUS) liquid 237 mL  237 mL Oral BID BM Massengill, Nathan, MD   237 mL at 09/06/22 1830   FLUoxetine (PROZAC) capsule 40 mg  40 mg Oral Daily Massengill, Nathan, MD   40 mg at 09/12/22 0836   haloperidol (HALDOL) tablet 5 mg  5 mg Oral TID PRN Bobbitt, Shalon E, NP       Or   haloperidol lactate (HALDOL) injection 5 mg  5 mg Intramuscular TID PRN Bobbitt, Shalon E, NP       hydrOXYzine (ATARAX) tablet 25 mg  25 mg Oral TID PRN Bobbitt, Shalon E, NP   25 mg at 09/09/22 2053   levothyroxine (SYNTHROID) tablet 100 mcg  100 mcg Oral Daily Armandina Stammer I, NP   100 mcg at 09/12/22 0836   loratadine (CLARITIN) tablet 10 mg  10 mg Oral Daily Armandina Stammer I, NP   10 mg at 09/12/22 0836   LORazepam (ATIVAN) tablet 2 mg  2 mg Oral TID PRN Bobbitt, Shalon E, NP       Or   LORazepam  (ATIVAN) injection 2 mg  2 mg Intramuscular TID PRN Bobbitt, Shalon E, NP       LORazepam (ATIVAN) tablet 0.5 mg  0.5 mg Oral Daily Massengill, Nathan, MD   0.5 mg at 09/12/22 0839   LORazepam (ATIVAN) tablet 1 mg  1 mg Oral QHS Massengill, Nathan, MD   1 mg at 09/11/22 2006   magnesium hydroxide (MILK OF MAGNESIA) suspension 30 mL  30 mL Oral Daily PRN Bobbitt, Shalon E, NP       mometasone-formoterol (DULERA) 100-5 MCG/ACT inhaler 2 puff  2 puff Inhalation BID Armandina Stammer I, NP   2 puff at 09/12/22 0837   montelukast (SINGULAIR) tablet 10 mg  10 mg Oral QHS Sindy Guadeloupe, NP   10 mg at 09/11/22 2007   omega-3 acid ethyl esters (LOVAZA) capsule 1 g  1 g Oral Daily Nwoko, Nicole Kindred I, NP   1 g at 09/12/22 0836   propranolol (INDERAL) tablet 10 mg  10 mg Oral Q12H Massengill, Harrold Donath, MD   10 mg at 09/12/22 0836   risperiDONE (RISPERDAL) tablet 3 mg  3 mg Oral QHS Massengill, Nathan, MD   3 mg at  09/11/22 2007   tamsulosin (FLOMAX) capsule 0.4 mg  0.4 mg Oral QPC breakfast Massengill, Nathan, MD   0.4 mg at 09/12/22 0840   traZODone (DESYREL) tablet 200 mg  200 mg Oral QHS Ntuen, Jesusita Oka, FNP   200 mg at 09/11/22 2006   vitamin D3 (CHOLECALCIFEROL) tablet 1,000 Units  1,000 Units Oral Daily Armandina Stammer I, NP   1,000 Units at 09/12/22 0836   PTA Medications: Medications Prior to Admission  Medication Sig Dispense Refill Last Dose   atorvastatin (LIPITOR) 20 MG tablet TAKE 1 TABLET(20 MG) BY MOUTH DAILY (Patient taking differently: Take 20 mg by mouth daily.) 90 tablet 1    budesonide-formoterol (SYMBICORT) 80-4.5 MCG/ACT inhaler Inhale 2 puffs into the lungs 2 (two) times daily. (Patient taking differently: Inhale 2 puffs into the lungs 2 (two) times daily as needed (For shortness of breath).) 10.2 g 2    cetirizine (ZYRTEC) 10 MG tablet Take 10 mg by mouth daily.      chlorpheniramine (CHLOR-TRIMETON) 4 MG tablet Take 4 mg by mouth daily.      Cholecalciferol (VITAMIN D PO) Take 1 tablet by mouth  daily.       Cyanocobalamin (VITAMIN B 12 PO) Take 1 tablet by mouth daily.      ELDERBERRY PO Take 1 tablet by mouth daily.      EPINEPHrine 0.3 mg/0.3 mL IJ SOAJ injection Inject 0.3 mg into the muscle as needed for anaphylaxis.      levothyroxine (SYNTHROID) 100 MCG tablet TAKE 1 TABLET(100 MCG) BY MOUTH DAILY BEFORE BREAKFAST (Patient taking differently: Take 100 mcg by mouth daily.) 90 tablet 1    montelukast (SINGULAIR) 10 MG tablet Take 1 tablet (10 mg total) by mouth at bedtime. 90 tablet 3    Omega-3 Fatty Acids (FISH OIL) 1000 MG CPDR Take 1,000 mg by mouth daily.      PROAIR HFA 108 (90 Base) MCG/ACT inhaler 1-2 puffs every 4-6 hours as needed for shortness of breath or wheezing (Patient taking differently: 1-2 puffs every 4 (four) hours as needed for wheezing or shortness of breath.) 8.5 g 0    tetrahydrozoline 0.05 % ophthalmic solution Place 1-2 drops into both eyes 4 (four) times daily as needed (For eye irritation).      vitamin C (ASCORBIC ACID) 500 MG tablet Take 500 mg by mouth daily.      VITAMIN E PO Take 1 tablet by mouth daily.        Patient Stressors: Other: "Paranoia"    Patient Strengths: Ability for insight  Average or above average intelligence  Capable of independent living  Education administrator  Motivation for treatment/growth  Religious Affiliation  Supportive family/friends   Treatment Modalities: Medication Management, Group therapy, Case management,  1 to 1 session with clinician, Psychoeducation, Recreational therapy.   Physician Treatment Plan for Primary Diagnosis: Brief psychotic disorder (HCC) Long Term Goal(s): Improvement in symptoms so as ready for discharge   Short Term Goals: Ability to identify and develop effective coping behaviors will improve Ability to maintain clinical measurements within normal limits will improve Compliance with prescribed medications will improve Ability to identify changes in lifestyle to reduce  recurrence of condition will improve Ability to verbalize feelings will improve Ability to disclose and discuss suicidal ideas Ability to demonstrate self-control will improve  Medication Management: Evaluate patient's response, side effects, and tolerance of medication regimen.  Therapeutic Interventions: 1 to 1 sessions, Unit Group sessions and Medication administration.  Evaluation of Outcomes: Progressing  Physician Treatment Plan for Secondary Diagnosis: Principal Problem:   Brief psychotic disorder (HCC) Active Problems:   Psychosis, unspecified psychosis type (HCC)  Long Term Goal(s): Improvement in symptoms so as ready for discharge   Short Term Goals: Ability to identify and develop effective coping behaviors will improve Ability to maintain clinical measurements within normal limits will improve Compliance with prescribed medications will improve Ability to identify changes in lifestyle to reduce recurrence of condition will improve Ability to verbalize feelings will improve Ability to disclose and discuss suicidal ideas Ability to demonstrate self-control will improve     Medication Management: Evaluate patient's response, side effects, and tolerance of medication regimen.  Therapeutic Interventions: 1 to 1 sessions, Unit Group sessions and Medication administration.  Evaluation of Outcomes: Progressing   RN Treatment Plan for Primary Diagnosis: Brief psychotic disorder (HCC) Long Term Goal(s): Knowledge of disease and therapeutic regimen to maintain health will improve  Short Term Goals: Ability to remain free from injury will improve, Ability to verbalize frustration and anger appropriately will improve, Ability to demonstrate self-control, Ability to participate in decision making will improve, Ability to verbalize feelings will improve, Ability to disclose and discuss suicidal ideas, Ability to identify and develop effective coping behaviors will improve, and  Compliance with prescribed medications will improve  Medication Management: RN will administer medications as ordered by provider, will assess and evaluate patient's response and provide education to patient for prescribed medication. RN will report any adverse and/or side effects to prescribing provider.  Therapeutic Interventions: 1 on 1 counseling sessions, Psychoeducation, Medication administration, Evaluate responses to treatment, Monitor vital signs and CBGs as ordered, Perform/monitor CIWA, COWS, AIMS and Fall Risk screenings as ordered, Perform wound care treatments as ordered.  Evaluation of Outcomes: Progressing   LCSW Treatment Plan for Primary Diagnosis: Brief psychotic disorder [facility]) Long Term Goal(s): Safe transition to appropriate next level of care at discharge, Engage patient in therapeutic group addressing interpersonal concerns.  Short Term Goals: Engage patient in aftercare planning with referrals and resources, Increase social support, Increase ability to appropriately verbalize feelings, Increase emotional regulation, Facilitate acceptance of mental health diagnosis and concerns, Facilitate patient progression through stages of change regarding substance use diagnoses and concerns, Identify triggers associated with mental health/substance abuse issues, and Increase skills for wellness and recovery  Therapeutic Interventions: Assess for all discharge needs, 1 to 1 time with Social worker, Explore available resources and support systems, Assess for adequacy in community support network, Educate family and significant other(s) on suicide prevention, Complete Psychosocial Assessment, Interpersonal group therapy.  Evaluation of Outcomes: Progressing   Progress in Treatment: Attending groups: Yes. Participating in groups: Yes. Taking medication as prescribed: Yes. Toleration medication: Yes. Family/Significant other contact made: Yes, individual(s) contacted:  [name] Hodapp  [phone]. Patient understands diagnosis: Yes. Discussing patient identified problems/goals with staff: Yes. Medical problems stabilized or resolved: Yes. Denies suicidal/homicidal ideation: Yes. Issues/concerns per patient self-inventory: Yes. Other: N/A  New problem(s) identified: No, Describe:  none reported  New Short Term/Long Term Goal(s):Medication stabilization, elimination of SI thoughts, development of comprehensive mental wellness plan.   Patient Goals:  Medication stabilization   Discharge Plan or Barriers: Provide community resources and psychosocial education pertaining to his disorder. CSW will continue to follow and assess for appropriate referrals and possible discharge planning.   Reason for Continuation of Hospitalization: Delusions  Depression Hallucinations Medication stabilization  Estimated Length of Stay:3-7 days  Last 3 [place] Suicide Severity Risk Score: Flowsheet Row Admission (  [facility] Most recent reading at 09/05/2022 11:55 PM ED from 09/05/2022 in [facility] Most recent reading at 09/05/2022 11:59 AM Video Visit from 08/26/2022 in [facility] Most recent reading at 08/26/2022  1:23 PM  C-SSRS RISK CATEGORY No Risk No Risk No Risk       Last PHQ 2/9 Scores:    08/26/2022    1:23 PM 06/11/2021   11:20 AM 12/04/2020   10:22 AM  Depression screen PHQ 2/9  Decreased Interest 3 0 0  Down, Depressed, Hopeless 3 0 0  PHQ - 2 Score 6 0 0  Altered sleeping 2    Tired, decreased energy 1    Change in appetite 0    Feeling bad or failure about yourself  1    Trouble concentrating 1    Moving slowly or fidgety/restless 1    Suicidal thoughts 1    PHQ-9 Score 13    Difficult doing work/chores Extremely dIfficult      Scribe for Treatment Team: medication stabilization, elimination of SI thoughts, development of comprehensive mental  wellness plan.        Starleen Arms, LCSW 09/12/2022 1:21 PM

## 2022-09-12 NOTE — Progress Notes (Signed)
   09/12/22 2050  Psych Admission Type (Psych Patients Only)  Admission Status Voluntary  Psychosocial Assessment  Patient Complaints Worrying  Eye Contact Brief  Facial Expression Flat  Affect Depressed  Speech Logical/coherent  Interaction Minimal  Motor Activity Slow  Appearance/Hygiene Unremarkable  Behavior Characteristics Appropriate to situation  Mood Depressed;Preoccupied;Pleasant  Thought Process  Coherency WDL  Content WDL  Delusions None reported or observed  Perception Hallucinations  Hallucination Auditory  Judgment Poor  Confusion None  Danger to Self  Current suicidal ideation? Denies

## 2022-09-12 NOTE — Group Note (Signed)
Date:  09/12/2022 Time:  8:39 PM  Group Topic/Focus:  Wrap-Up Group:   The focus of this group is to help patients review their daily goal of treatment and discuss progress on daily workbooks.    Participation Level:  Active  Participation Quality:  Appropriate  Affect:  Appropriate  Cognitive:  Appropriate  Insight: Appropriate  Engagement in Group:  Engaged  Modes of Intervention:  Education and Exploration  Additional Comments:  Patient attended and participated in group tonight. He reports that he had a good night rest last night which was significant to him.  Lita Mains [facility] 09/12/2022, 8:39 PM

## 2022-09-12 NOTE — Group Note (Signed)
Recreation Therapy Group Note   Group Topic:Communication  Group Date: 09/12/2022 Start Time: 1035 End Time: 1100 Facilitators: Axell Trigueros-McCall, LRT,CTRS Location: [address] Dayroom   Goal Area(s) Addresses:  Patient will effectively listen to complete activity.  Patient will identify communication skills used to make activity successful.  Patient will identify how skills used during activity can be used to reach post d/c goals.    Group Description: Geometric Drawings.  Three volunteers from the peer group will be shown an abstract picture with a particular arrangement of geometrical shapes.  Each round, one 'speaker' will describe the pattern, as accurately as possible without revealing the image to the group.  The remaining group members will listen and draw the picture to reflect how it is described to them. Patients with the role of 'listener' cannot ask clarifying questions but, may request that the speaker repeat a direction. Once the drawings are complete, the presenter will show the rest of the group the picture and compare how close each person came to drawing the picture. LRT will facilitate a post-activity discussion regarding effective communication and the importance of planning, listening, and asking for clarification in daily interactions with others.   Affect/Mood: N/A   Participation Level: Did not attend    Clinical Observations/Individualized Feedback:     Plan: Continue to engage patient in RT group sessions 2-3x/week.   Corrine Tillis-McCall, LRT,CTRS 09/12/2022 1:14 PM

## 2022-09-12 NOTE — Progress Notes (Signed)

## 2022-09-13 DIAGNOSIS — F23 Brief psychotic disorder: Secondary | ICD-10-CM | POA: Diagnosis not present

## 2022-09-13 LAB — RPR: RPR Ser Ql: NONREACTIVE — AB

## 2022-09-13 MED ORDER — POLYETHYLENE GLYCOL 3350 17 G PO PACK
17.0000 g | PACK | Freq: Every day | ORAL | Status: DC
  Administered 2022-09-14 – 2022-09-18 (×4): 17 g via ORAL
  Filled 2022-09-13 (×7): qty 1

## 2022-09-13 MED ORDER — RISPERIDONE 2 MG PO TABS
4.0000 mg | ORAL_TABLET | Freq: Every day | ORAL | Status: DC
  Administered 2022-09-13 – 2022-09-17 (×5): 4 mg via ORAL
  Filled 2022-09-13 (×7): qty 2

## 2022-09-13 MED ORDER — DOCUSATE SODIUM 100 MG PO CAPS
100.0000 mg | ORAL_CAPSULE | Freq: Two times a day (BID) | ORAL | Status: DC
  Administered 2022-09-13 – 2022-09-18 (×10): 100 mg via ORAL
  Filled 2022-09-13 (×13): qty 1

## 2022-09-13 MED ORDER — GLYCERIN (LAXATIVE) 2 G RE SUPP
1.0000 | Freq: Once | RECTAL | Status: AC
  Administered 2022-09-13: 1 via RECTAL
  Filled 2022-09-13 (×2): qty 1

## 2022-09-13 NOTE — Progress Notes (Signed)
   09/13/22 2012  Psych Admission Type (Psych Patients Only)  Admission Status Voluntary  Psychosocial Assessment  Patient Complaints None  Eye Contact Brief  Facial Expression Flat  Affect Depressed  Speech Logical/coherent  Interaction Minimal  Motor Activity Slow  Appearance/Hygiene Unremarkable  Behavior Characteristics Cooperative;Appropriate to situation  Mood Pleasant  Thought Process  Coherency WDL  Content WDL  Delusions None reported or observed  Perception Hallucinations  Hallucination Auditory  Judgment Poor  Confusion None  Danger to Self  Current suicidal ideation? Denies

## 2022-09-13 NOTE — Group Note (Signed)
Date:  09/13/2022 Time:  9:30 PM  Group Topic/Focus:  Wrap-Up Group:   The focus of this group is to help patients review their daily goal of treatment and discuss progress on daily workbooks.    Participation Level:  Active  Participation Quality:  Appropriate  Affect:  Appropriate  Cognitive:  Appropriate  Insight: Appropriate  Engagement in Group:  Engaged  Modes of Intervention:  Education and Exploration  Additional Comments:  Patient attended and participated in group tonight. He reports that his day was a five because it was a rough day for him. He jis glad to be here. He working on issues.  Lita Mains [facility] 09/13/2022, 9:30 PM

## 2022-09-13 NOTE — Progress Notes (Signed)
   09/13/22 0900  Psych Admission Type (Psych Patients Only)  Admission Status Voluntary  Psychosocial Assessment  Patient Complaints Worrying  Eye Contact Brief  Facial Expression Flat  Affect Depressed;Flat  Speech Logical/coherent  Interaction Cautious;Minimal  Motor Activity Slow  Appearance/Hygiene Unremarkable  Behavior Characteristics Appropriate to situation  Mood Depressed;Apprehensive  Thought Process  Coherency WDL  Content WDL  Delusions None reported or observed  Perception Hallucinations  Hallucination Auditory  Judgment Impaired  Confusion None  Danger to Self  Current suicidal ideation? Denies  Agreement Not to Harm Self Yes  Description of Agreement verbal  Danger to Others  Danger to Others None reported or observed

## 2022-09-13 NOTE — Progress Notes (Signed)
   09/13/22 4098  15 Minute Checks  Location Dayroom  Visual Appearance Calm  Behavior Composed  Sleep (Behavioral Health Patients Only)  Calculate sleep? (Click Yes once per 24 hr at 0600 safety check) Yes  Documented sleep last 24 hours 3.25

## 2022-09-13 NOTE — Group Note (Unsigned)
Date:  09/13/2022 Time:  8:26 PM  Group Topic/Focus:  Wrap-Up Group:   The focus of this group is to help patients review their daily goal of treatment and discuss progress on daily workbooks.     Participation Level:  {BHH PARTICIPATION ZOXWR:60454}  Participation Quality:  {BHH PARTICIPATION QUALITY:22265}  Affect:  {BHH AFFECT:22266}  Cognitive:  {BHH COGNITIVE:22267}  Insight: {BHH Insight2:20797}  Engagement in Group:  {BHH ENGAGEMENT IN UJWJX:91478}  Modes of Intervention:  {BHH MODES OF INTERVENTION:22269}  Additional Comments:  ***  Scot Dock 09/13/2022, 8:26 PM

## 2022-09-13 NOTE — Progress Notes (Signed)
[facility] MD Progress Note  09/13/2022 6:52 PM Don Hurst  MRN:  [id number]  Reason for admission: Don Hurst is a 62 year old AA male with no known hx of psychiatric hospitalizations, diagnoses or treatments until now. There were no hx of substance use. [facility] the [facility] with complaint of new unset of psychosis that comprises of paranoia, delusional thoughts/hallucinations. After the [facility] initial evaluation, patient was transferred to the [facility] for further psychiatric evaluation/treatments. A review of his current lab results has shown an elevated hgba1c 6.1 & toxicology/UDS results were negative of all illegal substances.   Yesterday the psychiatry team made the following recommendations:  -Increase Risperdal from 3 mg to 4 mg po Q hs for psychosis -Continue Trazodone 200 mg po Q hs for insomnia  -Discontinued Abilify to 15 mg p.o. daily 09/10/2022. -Continue Prozac 40 mg  po for  dep/anxiety/OCD type symptoms.  -Continue Ativan 0.5 mg po daily for anxiety.  -Continue Ativan 1 mg po daily for anxiety/insomnia.  -Continue propranolol 10 mg po bid for elevated heart rate. -Initiate MiraLAX 17 mg p.o. daily for constipation -Initiate Colace 100 mg p.o. twice daily for constipation  On assessment today, the pt reports that his mood is better and less depressed Reports that anxiety is is at manageable level Nursing staff report patient sleeps only 3.25 hours last night, however patient continues on trazodone 200 mg q. nightly.  Nursing staff to monitor sleep hours closely Appetite is good Concentration is improving with slight thought blocking Energy level is adequate Denies suicidal thoughts.  Denies suicidal intent or plan.  Denies having any HI.  Denies having psychotic symptoms.   Denies having side effects to current psychiatric medications.   We discussed changes to current medication regimen, including increasing Risperdal tablet from 3 mg p.o. q. nightly to 4 mg  p.o. q. nightly for psychosis, adding MiraLAX 17 g p.o. daily and Colace 100 mg p.o. twice daily for constipation.  Patient in agreement with medication adjustment.  Patient reports he had a good bowel movement earlier today after glycerin suppository was given and he felt relieved.  Discussed the following psychosocial stressors: Attending therapeutic milieu and group therapies which could improve his mood.  And maintaining proper hygiene and ADLs.  Patient smiles and return  Principal Problem: Brief psychotic disorder (HCC) Diagnosis: Principal Problem:   Brief psychotic disorder (HCC) Active Problems:   Psychosis, unspecified psychosis type (HCC)  Total Time spent with patient: 35 minutes  Past Psychiatric History: See H&P  Past Medical History:  Past Medical History:  Diagnosis Date   Allergy    Arthritis    Asthma    GERD (gastroesophageal reflux disease)    History of chronic bronchitis    Multiple allergies    Wears glasses     Past Surgical History:  Procedure Laterality Date   COLONOSCOPY     INGUINAL HERNIA REPAIR  02/2004   rt   INGUINAL HERNIA REPAIR Left 02/06/2016   Procedure: LAPAROSCOPIC LEFT INGUINAL HERNIA REPAIR;  Surgeon: Berna Bue, MD;  Location: [facility];  Service: General;  Laterality: Left;   INSERTION OF MESH Left 02/06/2016   Procedure: INSERTION OF MESH;  Surgeon: Berna Bue, MD;  Location: [facility];  Service: General;  Laterality: Left;   PAROTIDECTOMY Left 02/08/2014   Procedure: LEFT SUPERFICIAL PAROTIDECTOMY;  Surgeon: Flo Shanks, MD;  Location: [facility];  Service: ENT;  Laterality: Left;   Family History:  Family History  Problem Relation Age of Onset   Hypertension Mother        per pt dx around 39 y o   Cancer Father 74       colon   Eczema Daughter    Hypertension Sister    Family Psychiatric  History: See H&P  Social History:  Social History   Substance and Sexual Activity  Alcohol Use No     Social  History   Substance and Sexual Activity  Drug Use No    Social History   Socioeconomic History   Marital status: Married    Spouse name: Not on file   Number of children: Not on file   Years of education: Not on file   Highest education level: Not on file  Occupational History   Not on file  Tobacco Use   Smoking status: Former    Current packs/day: 0.00    Average packs/day: 0.3 packs/day for 3.0 years (0.8 ttl pk-yrs)    Types: Cigarettes    Start date: 03/25/1988    Quit date: 03/26/1991    Years since quitting: 31.4   Smokeless tobacco: Never   Tobacco comments:    smoked 1 pk every 2 wks  Vaping Use   Vaping status: Never Used  Substance and Sexual Activity   Alcohol use: No   Drug use: No   Sexual activity: Yes    Comment: number of sex partners in the last 12 months  1  Other Topics Concern   Not on file  Social History Narrative   Exercise doing weights 4 times per week for 30 minutes   Social Determinants of Health   Financial Resource Strain: Not on file  Food Insecurity: No Food Insecurity (09/05/2022)   Hunger Vital Sign    Worried About Running Out of Food in the Last Year: Never true    Ran Out of Food in the Last Year: Never true  Transportation Needs: No Transportation Needs (09/05/2022)   PRAPARE - Administrator, Civil Service (Medical): No    Lack of Transportation (Non-Medical): No  Physical Activity: Not on file  Stress: Not on file  Social Connections: Not on file   Additional Social History:    Current Medications: Current Facility-Administered Medications  Medication Dose Route Frequency Provider Last Rate Last Admin   acetaminophen (TYLENOL) tablet 650 mg  650 mg Oral Q6H PRN Bobbitt, Shalon E, NP       albuterol (VENTOLIN HFA) 108 (90 Base) MCG/ACT inhaler 1-2 puff  1-2 puff Inhalation Q4H PRN Nwoko, Agnes I, NP       alum & mag hydroxide-simeth (MAALOX/MYLANTA) 200-200-20 MG/5ML suspension 30 mL  30 mL Oral Q4H PRN Bobbitt,  Shalon E, NP   30 mL at 09/09/22 2057   atorvastatin (LIPITOR) tablet 20 mg  20 mg Oral Daily Armandina Stammer I, NP   20 mg at 09/13/22 0802   diphenhydrAMINE (BENADRYL) capsule 50 mg  50 mg Oral TID PRN Bobbitt, Shalon E, NP       Or   diphenhydrAMINE (BENADRYL) injection 50 mg  50 mg Intramuscular TID PRN Bobbitt, Shalon E, NP       docusate sodium (COLACE) capsule 100 mg  100 mg Oral BID Massengill, Harrold Donath, MD   100 mg at 09/13/22 1719   feeding supplement (ENSURE ENLIVE / ENSURE PLUS) liquid 237 mL  237 mL Oral BID BM Massengill, Harrold Donath, MD   237 mL at 09/06/22 1830  FLUoxetine (PROZAC) capsule 40 mg  40 mg Oral Daily Massengill, Nathan, MD   40 mg at 09/13/22 0802   haloperidol (HALDOL) tablet 5 mg  5 mg Oral TID PRN Bobbitt, Shalon E, NP       Or   haloperidol lactate (HALDOL) injection 5 mg  5 mg Intramuscular TID PRN Bobbitt, Shalon E, NP       hydrOXYzine (ATARAX) tablet 25 mg  25 mg Oral TID PRN Bobbitt, Shalon E, NP   25 mg at 09/09/22 2053   levothyroxine (SYNTHROID) tablet 100 mcg  100 mcg Oral Daily Armandina Stammer I, NP   100 mcg at 09/13/22 0802   loratadine (CLARITIN) tablet 10 mg  10 mg Oral Daily Armandina Stammer I, NP   10 mg at 09/13/22 0802   LORazepam (ATIVAN) tablet 2 mg  2 mg Oral TID PRN Bobbitt, Shalon E, NP       Or   LORazepam (ATIVAN) injection 2 mg  2 mg Intramuscular TID PRN Bobbitt, Shalon E, NP       LORazepam (ATIVAN) tablet 0.5 mg  0.5 mg Oral Daily Massengill, Nathan, MD   0.5 mg at 09/13/22 0802   LORazepam (ATIVAN) tablet 1 mg  1 mg Oral QHS Massengill, Nathan, MD   1 mg at 09/12/22 2047   magnesium hydroxide (MILK OF MAGNESIA) suspension 30 mL  30 mL Oral Daily PRN Bobbitt, Shalon E, NP       mometasone-formoterol (DULERA) 100-5 MCG/ACT inhaler 2 puff  2 puff Inhalation BID Armandina Stammer I, NP   2 puff at 09/13/22 0803   montelukast (SINGULAIR) tablet 10 mg  10 mg Oral QHS Sindy Guadeloupe, NP   10 mg at 09/12/22 2046   omega-3 acid ethyl esters (LOVAZA) capsule 1 g   1 g Oral Daily Nwoko, Agnes I, NP   1 g at 09/13/22 0802   polyethylene glycol (MIRALAX / GLYCOLAX) packet 17 g  17 g Oral Daily Massengill, Nathan, MD       propranolol (INDERAL) tablet 10 mg  10 mg Oral Q12H Massengill, Nathan, MD   10 mg at 09/13/22 0802   risperiDONE (RISPERDAL) tablet 3 mg  3 mg Oral QHS Massengill, Nathan, MD   3 mg at 09/12/22 2048   tamsulosin (FLOMAX) capsule 0.4 mg  0.4 mg Oral QPC breakfast Massengill, Harrold Donath, MD   0.4 mg at 09/13/22 0802   traZODone (DESYREL) tablet 200 mg  200 mg Oral QHS Lathen Seal C, FNP   200 mg at 09/12/22 2030   vitamin D3 (CHOLECALCIFEROL) tablet 1,000 Units  1,000 Units Oral Daily Armandina Stammer I, NP   1,000 Units at 09/13/22 0802   Lab Results:  Results for orders placed or performed during the hospital encounter of 09/05/22 (from the past 48 hour(s))  TSH     Status: None   Collection Time: 09/11/22  7:01 PM  Result Value Ref Range   TSH 3.285 0.350 - 4.500 uIU/mL    Comment: Performed by a 3rd Generation assay with a functional sensitivity of <=0.01 uIU/mL. Performed at [facility], [address] [place], [place] [address]   Sedimentation rate     Status: None   Collection Time: 09/11/22  7:01 PM  Result Value Ref Range   Sed Rate 6 0 - 16 mm/hr    Comment: Performed at [facility], [address], [place], [place] [address]  HIV Antibody (routine testing w rflx)  Status: None   Collection Time: 09/11/22  7:01 PM  Result Value Ref Range   HIV Screen 4th Generation wRfx Non Reactive Non Reactive    Comment: Performed at [facility], [address] [place], [place] [address]  Vitamin B12     Status: Abnormal   Collection Time: 09/11/22  7:01 PM  Result Value Ref Range   Vitamin B-12 1,929 (H) 180 - 914 pg/mL    Comment: RESULT CONFIRMED BY MANUAL DILUTION (NOTE) This assay is not validated for testing neonatal or myeloproliferative syndrome specimens for Vitamin B12  levels. Performed at [facility], [address] [place], [place] [address]     Blood Alcohol level:  Lab Results  Component Value Date   ETH <10 09/05/2022   Metabolic Disorder Labs: Lab Results  Component Value Date   HGBA1C 6.1 (H) 09/05/2022   MPG 128 09/05/2022   No results found for: "PROLACTIN" Lab Results  Component Value Date   CHOL 126 09/05/2022   TRIG 25 09/05/2022   HDL 52 09/05/2022   CHOLHDL 2.4 09/05/2022   VLDL 5 09/05/2022   LDLCALC 69 09/05/2022   LDLCALC 85 06/17/2022   Physical Findings: AIMS:  , ,  ,  ,    CIWA:    COWS:     Musculoskeletal: Strength & Muscle Tone: within normal limits Gait & Station: normal Patient leans: N/A  Psychiatric Specialty Exam:  Presentation  General Appearance:  Casual; Appropriate for Environment; Fairly Groomed  Eye Contact: Fair  Speech: Blocked; Clear and Coherent (Slight thought blocking)  Speech Volume: Normal  Handedness: Right  Mood and Affect  Mood: Anxious; Depressed  Affect: Congruent  Thought Process  Thought Processes: Disorganized (Slightly disorganized but coherent)  Descriptions of Associations:Loose  Orientation:Full (Time, Place and Person)  Thought Content:Paranoid Ideation; Tangential  History of Schizophrenia/Schizoaffective disorder:No  Duration of Psychotic Symptoms:Less than six months  Hallucinations:Hallucinations: None   Ideas of Reference:Paranoia (Improving)  Suicidal Thoughts:Suicidal Thoughts: No   Homicidal Thoughts:Homicidal Thoughts: No   Sensorium  Memory: Immediate Fair  Judgment: Fair  Insight: Shallow  Executive Functions  Concentration: Fair  Attention Span: Fair  Recall: Fair  Fund of Knowledge: Fair  Language: Fair  Psychomotor Activity  Psychomotor Activity: Psychomotor Activity: Normal   Assets  Assets: Communication Skills; Desire for Improvement; Housing; Physical Health;  Resilience; Social Support  Sleep  Sleep: Sleep: Poor Number of Hours of Sleep: 3.75   Physical Exam: Physical Exam Vitals reviewed.  Constitutional:      General: He is not in acute distress.    Appearance: He is normal weight. He is not toxic-appearing.  HENT:     Head: Normocephalic.     Mouth/Throat:     Mouth: Mucous membranes are moist.     Pharynx: Oropharynx is clear.  Eyes:     Extraocular Movements: Extraocular movements intact.     Pupils: Pupils are equal, round, and reactive to light.  Cardiovascular:     Rate and Rhythm: Normal rate.     Pulses: Normal pulses.  Pulmonary:     Effort: Pulmonary effort is normal. No respiratory distress.  Abdominal:     Comments: Deferred  Genitourinary:    Comments: Patient presents BHP symptoms.  Started on Flomax 0.4 mg. Musculoskeletal:        General: Normal range of motion.     Cervical back: Normal range of motion.  Skin:    General: Skin is warm.  Neurological:  Mental Status: He is alert.     Motor: No weakness.     Gait: Gait normal.  Psychiatric:        Behavior: Behavior normal.    Review of Systems  Constitutional:  Negative for chills and fever.  Respiratory:  Negative for shortness of breath and wheezing.   Cardiovascular:  Negative for chest pain and palpitations.  Gastrointestinal:  Negative for heartburn and nausea.  Neurological:  Negative for dizziness, tingling, tremors and headaches.  Endo/Heme/Allergies:        See allergy listing  Psychiatric/Behavioral:  Negative for depression, hallucinations, memory loss, substance abuse and suicidal ideas. The patient is nervous/anxious. The patient does not have insomnia.   All other systems reviewed and are negative.  Blood pressure 116/83, pulse 95, temperature 98.4 F (36.9 C), temperature source Oral, resp. rate 12, height 5\' 10"  (1.778 m), weight 76.6 kg, SpO2 99%. Body mass index is 24.22 kg/m.  Treatment Plan Summary: Daily contact with  patient to assess and evaluate symptoms and progress in treatment and Medication management  Continue inpatient hospitalization.  Will continue today 09/13/2022 plan as below except where it is noted.   Diagnoses / Active Problems: Brief psychotic disorder.  Unspecified psychotic disorder. Obsessive compulsive disorder.  PLAN:The risks/benefits/side-effects/alternatives to the medications in use were discussed in detail with the patient and time was given for patient's questions. The patient consents to medication trial. -Increase Risperdal from 3 mg to 4 mg po Q hs for psychosis -Continue Trazodone 200 mg po Q hs for insomnia  -Discontinued Abilify to 15 mg p.o. daily 09/10/2022. -Continue Prozac 40 mg  po for  dep/anxiety/OCD type symptoms.  -Continue Ativan 0.5 mg po daily for anxiety.  -Continue Ativan 1 mg po daily for anxiety/insomnia.  -Continue propranolol 10 mg po bid for elevated heart rate.  Agitation protocols: Cont as recommended;  -Benadryl 50 mg po or IM tid prn. -Haldol 5 mg po or IM tid prn.  -Lorazepam 2 mg po or IM tid prn.   Lab ordered: ANA w/reflex if positive, Ceruloplasmin, Heavy metal profile (urine), HIV antibody (routine w/rflx), RRR, Sedimentation rate, TSH, Vit. B12.  Other medical conditions.  -Continue Albuterol inhaler 1-2 puffs Q 4 hrs prn for SOB.  -Continue Dulera 2 puffs bid for asthma. -Continue atorvastatin 20 mg po Q 1800 for hyperlipidemia.  -Continue Synthroid 100 mcg po daily for hypothyroidism.  -Continue Claritin 10 mg po daily for allergies.  -Continue Vit D3 1,000 units po daily for bone health.  -Continue Omega -3 acid 1 gm po daily for hyperlipidemia.  -Continue Singulair 10 mg po every day for breathing issues. -Continue Flomax 0.4 mg po every day for BPH symptoms. -Initiate MiraLAX 17 mg p.o. daily for constipation -Initiate Colace 100 mg p.o. twice daily for constipation   Other PRNS -Continue Tylenol 650 mg every 6 hours PRN  for mild pain -Continue Maalox 30 ml Q 4 hrs PRN for indigestion -Continue MOM 30 ml po Q 6 hrs for constipation.  -Give magnesium citrate 1 bottle once today for constipation.   Safety and Monitoring: Voluntary admission to inpatient psychiatric unit for safety, stabilization and treatment Daily contact with patient to assess and evaluate symptoms and progress in treatment Patient's case to be discussed in multi-disciplinary team meeting Observation Level : q15 minute checks Vital signs: q12 hours Precautions: Safety   Discharge Planning: Social work and case management to assist with discharge planning and identification of hospital follow-up needs prior to discharge Estimated  LOS: 5-7 days Discharge Concerns: Need to establish a safety plan; Medication compliance and effectiveness Discharge Goals: Return home with outpatient referrals for mental health follow-up including medication management/psychotherapy  Cecilie Lowers, FNP, pmhnp, fnp-bc. 09/13/2022, 6:52 PM Patient ID: Stormy Card, male   DOB: 1960/03/04, 62 y.o.   MRN: [id number] Patient ID: Kentrail Shew, male   DOB: [birth year], 62 y.o.   MRN: [id number] Patient ID: Washington Whedbee, male   DOB: [birth year], 62 y.o.   MRN: [id number] Patient ID: Montey Ebel, male   DOB: 1960-09-17, 62 y.o.   MRN: [id number] Patient ID: Dupree Givler, male   DOB: [birth year], 62 y.o.   MRN: [id number]

## 2022-09-14 DIAGNOSIS — F23 Brief psychotic disorder: Secondary | ICD-10-CM | POA: Diagnosis not present

## 2022-09-14 LAB — CERULOPLASMIN: Ceruloplasmin: 18.7 mg/dL (ref 16.0–31.0)

## 2022-09-14 NOTE — Group Note (Signed)
Date:  09/14/2022 Time:  9:00 PM  Group Topic/Focus:  Wrap-Up Group:   The focus of this group is to help patients review their daily goal of treatment and discuss progress on daily workbooks.    Participation Level:  Active  Participation Quality:  Appropriate  Affect:  Appropriate  Cognitive:  Appropriate  Insight: Appropriate  Engagement in Group:  Engaged  Modes of Intervention:  Education and Exploration  Additional Comments:  Patient attended and participated in group tonight. He reports that he like his personality and his overall make-up.  The significant thing about his day was he had a visitor.  Lita Mains [facility] 09/14/2022, 9:00 PM

## 2022-09-14 NOTE — Plan of Care (Addendum)
Problem: Education: Goal: Knowledge of [facility] Education information/materials will improve Outcome: Progressing   Problem: Activity: Goal: Interest or engagement in activities will improve Outcome: Progressing   Problem: Nutritional: Goal: Ability to achieve adequate nutritional intake will improve Outcome: Progressing   Problem: Safety: Goal: Ability to redirect hostility and anger into socially appropriate behaviors will improve Outcome: Progressing Pt A & O to self and place. Minimal, guarded on interactions. Presents with flat affect but brightens up when engaged. Reports fair sleep with good appetite. Denies HI, AVH and pain when assessed. However, he rates his depression 6.5/10, hopelessness and anxiety both 6/10 but will not elaborate on stressors. Pt endorsed +SI "Yeah, 50, 50 but you know I'm joking" with inappropriate laughter when prompted about plans by Clinical research associate. Pt educated on safety and available spiritual resources per his faith and needs. Showered, changed clothes prior to dinner with increased prompts. Safety checks maintained at Q 15 minutes intervals without incident. He tolerated meals and fluids well. Visible in dayroom at long intervals this shift for scheduled groups.

## 2022-09-14 NOTE — Progress Notes (Signed)
[facility] MD Progress Note  09/14/2022 7:11 PM Barney Russomanno  MRN:  [id number]  Reason for admission: Don Hurst is a 62 year old AA male with no known hx of psychiatric hospitalizations, diagnoses or treatments until now. There were no hx of substance use. Patient is admitted to the [facility] from the [facility] with complaint of new unset of psychosis that comprises of paranoia, delusional thoughts/hallucinations. After the [facility] initial evaluation, patient was transferred to the [facility] for further psychiatric evaluation/treatments. A review of his current lab results has shown an elevated hgba1c 6.1 & toxicology/UDS results were negative of all illegal substances.   Yesterday the psychiatry team made the following recommendations:  -Continue Risperdal from 4 mg po Q hs for psychosis -Continue Trazodone 200 mg po Q hs for insomnia  -Discontinued Abilify to 15 mg p.o. daily 09/10/2022. -Continue Prozac 40 mg  po for  dep/anxiety/OCD type symptoms.  -Continue Ativan 0.5 mg po daily for anxiety.  -Continue Ativan 1 mg po daily for anxiety/insomnia.  -Continue propranolol 10 mg po bid for elevated heart rate.  On assessment today, the pt reports that his mood is better and less depressed Reports that anxiety is is at manageable level Nursing staff report patient sleeps only 9.75 hours last night and more restful.  Thought process more organized. Appetite is good Concentration is improving with slight thought blocking Energy level is adequate Endorses suicidal thoughts, reports that is 50-50.  Unable to explain what 50-50 means to do this provider.  Patient added, " I plan to talk with a counselor to tell me how I am functioning.  I am concerned about getting better."  Denies suicidal intent or plan.  Chaplain referral recommended for patient to be seen. Denies having any HI.  Denies having psychotic symptoms, added, "the voices I used to hear in my head is much improved and I am not hearing it anymore."  Denies  having side effects to current psychiatric medications.   We discussed compliance to current medication regimen.  Patient nods in agreement and smiles.  Reports having good BMs and being cleaned from suppository and MiraLAX from yesterday.  Discussed the following psychosocial stressors: Attending therapeutic milieu and group therapies which could improve his mood.  And maintaining proper hygiene and ADLs.  Encouraged nursing staff to supervise patient's showers and change of clothing.  Principal Problem: Brief psychotic disorder (HCC) Diagnosis: Principal Problem:   Brief psychotic disorder (HCC) Active Problems:   Psychosis, unspecified psychosis type (HCC)  Total Time spent with patient: 35 minutes  Past Psychiatric History: See H&P  Past Medical History:  Past Medical History:  Diagnosis Date   Allergy    Arthritis    Asthma    GERD (gastroesophageal reflux disease)    History of chronic bronchitis    Multiple allergies    Wears glasses     Past Surgical History:  Procedure Laterality Date   COLONOSCOPY     INGUINAL HERNIA REPAIR  02/2004   rt   INGUINAL HERNIA REPAIR Left 02/06/2016   Procedure: LAPAROSCOPIC LEFT INGUINAL HERNIA REPAIR;  Surgeon: Berna Bue, MD;  Location: [facility];  Service: General;  Laterality: Left;   INSERTION OF MESH Left 02/06/2016   Procedure: INSERTION OF MESH;  Surgeon: Berna Bue, MD;  Location: [facility];  Service: General;  Laterality: Left;   PAROTIDECTOMY Left 02/08/2014   Procedure: LEFT SUPERFICIAL PAROTIDECTOMY;  Surgeon: Flo Shanks, MD;  Location: [facility];  Service: ENT;  Laterality:  Left;   Family History:  Family History  Problem Relation Age of Onset   Hypertension Mother        per pt dx around 85 y o   Cancer Father 29       colon   Eczema Daughter    Hypertension Sister    Family Psychiatric  History: See H&P  Social History:  Social History   Substance and Sexual Activity  Alcohol Use No      Social History   Substance and Sexual Activity  Drug Use No    Social History   Socioeconomic History   Marital status: Married    Spouse name: Not on file   Number of children: Not on file   Years of education: Not on file   Highest education level: Not on file  Occupational History   Not on file  Tobacco Use   Smoking status: Former    Current packs/day: 0.00    Average packs/day: 0.3 packs/day for 3.0 years (0.8 ttl pk-yrs)    Types: Cigarettes    Start date: 03/25/1988    Quit date: 03/26/1991    Years since quitting: 31.4   Smokeless tobacco: Never   Tobacco comments:    smoked 1 pk every 2 wks  Vaping Use   Vaping status: Never Used  Substance and Sexual Activity   Alcohol use: No   Drug use: No   Sexual activity: Yes    Comment: number of sex partners in the last 12 months  1  Other Topics Concern   Not on file  Social History Narrative   Exercise doing weights 4 times per week for 30 minutes   Social Determinants of Health   Financial Resource Strain: Not on file  Food Insecurity: No Food Insecurity (09/05/2022)   Hunger Vital Sign    Worried About Running Out of Food in the Last Year: Never true    Ran Out of Food in the Last Year: Never true  Transportation Needs: No Transportation Needs (09/05/2022)   PRAPARE - Administrator, Civil Service (Medical): No    Lack of Transportation (Non-Medical): No  Physical Activity: Not on file  Stress: Not on file  Social Connections: Not on file   Additional Social History:    Current Medications: Current Facility-Administered Medications  Medication Dose Route Frequency Provider Last Rate Last Admin   acetaminophen (TYLENOL) tablet 650 mg  650 mg Oral Q6H PRN Bobbitt, Shalon E, NP       albuterol (VENTOLIN HFA) 108 (90 Base) MCG/ACT inhaler 1-2 puff  1-2 puff Inhalation Q4H PRN Nwoko, Agnes I, NP       alum & mag hydroxide-simeth (MAALOX/MYLANTA) 200-200-20 MG/5ML suspension 30 mL  30 mL Oral Q4H  PRN Bobbitt, Shalon E, NP   30 mL at 09/09/22 2057   atorvastatin (LIPITOR) tablet 20 mg  20 mg Oral Daily Nwoko, Nicole Kindred I, NP   20 mg at 09/14/22 0836   diphenhydrAMINE (BENADRYL) capsule 50 mg  50 mg Oral TID PRN Bobbitt, Shalon E, NP       Or   diphenhydrAMINE (BENADRYL) injection 50 mg  50 mg Intramuscular TID PRN Bobbitt, Shalon E, NP       docusate sodium (COLACE) capsule 100 mg  100 mg Oral BID Massengill, Harrold Donath, MD   100 mg at 09/14/22 1628   feeding supplement (ENSURE ENLIVE / ENSURE PLUS) liquid 237 mL  237 mL Oral BID BM Phineas Inches, MD  237 mL at 09/06/22 1830   FLUoxetine (PROZAC) capsule 40 mg  40 mg Oral Daily Massengill, Harrold Donath, MD   40 mg at 09/14/22 0831   haloperidol (HALDOL) tablet 5 mg  5 mg Oral TID PRN Bobbitt, Shalon E, NP       Or   haloperidol lactate (HALDOL) injection 5 mg  5 mg Intramuscular TID PRN Bobbitt, Shalon E, NP       hydrOXYzine (ATARAX) tablet 25 mg  25 mg Oral TID PRN Bobbitt, Shalon E, NP   25 mg at 09/09/22 2053   levothyroxine (SYNTHROID) tablet 100 mcg  100 mcg Oral Daily Armandina Stammer I, NP   100 mcg at 09/14/22 6237   loratadine (CLARITIN) tablet 10 mg  10 mg Oral Daily Armandina Stammer I, NP   10 mg at 09/14/22 6283   LORazepam (ATIVAN) tablet 2 mg  2 mg Oral TID PRN Bobbitt, Shalon E, NP       Or   LORazepam (ATIVAN) injection 2 mg  2 mg Intramuscular TID PRN Bobbitt, Shalon E, NP       LORazepam (ATIVAN) tablet 0.5 mg  0.5 mg Oral Daily Massengill, Nathan, MD   0.5 mg at 09/14/22 0836   LORazepam (ATIVAN) tablet 1 mg  1 mg Oral QHS Massengill, Nathan, MD   1 mg at 09/13/22 2101   magnesium hydroxide (MILK OF MAGNESIA) suspension 30 mL  30 mL Oral Daily PRN Bobbitt, Shalon E, NP       mometasone-formoterol (DULERA) 100-5 MCG/ACT inhaler 2 puff  2 puff Inhalation BID Armandina Stammer I, NP   2 puff at 09/14/22 0831   montelukast (SINGULAIR) tablet 10 mg  10 mg Oral QHS Sindy Guadeloupe, NP   10 mg at 09/13/22 2101   omega-3 acid ethyl esters (LOVAZA)  capsule 1 g  1 g Oral Daily Nwoko, Agnes I, NP   1 g at 09/14/22 0831   polyethylene glycol (MIRALAX / GLYCOLAX) packet 17 g  17 g Oral Daily Massengill, Harrold Donath, MD   17 g at 09/14/22 0833   propranolol (INDERAL) tablet 10 mg  10 mg Oral Q12H Massengill, Harrold Donath, MD   10 mg at 09/14/22 1517   risperiDONE (RISPERDAL) tablet 4 mg  4 mg Oral QHS Swati Granberry C, FNP   4 mg at 09/13/22 2101   tamsulosin (FLOMAX) capsule 0.4 mg  0.4 mg Oral QPC breakfast Massengill, Harrold Donath, MD   0.4 mg at 09/14/22 6160   traZODone (DESYREL) tablet 200 mg  200 mg Oral QHS Tyshae Stair C, FNP   200 mg at 09/13/22 2101   vitamin D3 (CHOLECALCIFEROL) tablet 1,000 Units  1,000 Units Oral Daily Armandina Stammer I, NP   1,000 Units at 09/14/22 7371   Lab Results:  No results found for this or any previous visit (from the past 48 hour(s)).   Blood Alcohol level:  Lab Results  Component Value Date   ETH <10 09/05/2022   Metabolic Disorder Labs: Lab Results  Component Value Date   HGBA1C 6.1 (H) 09/05/2022   MPG 128 09/05/2022   No results found for: "PROLACTIN" Lab Results  Component Value Date   CHOL 126 09/05/2022   TRIG 25 09/05/2022   HDL 52 09/05/2022   CHOLHDL 2.4 09/05/2022   VLDL 5 09/05/2022   LDLCALC 69 09/05/2022   LDLCALC 85 06/17/2022   Physical Findings: AIMS: Facial and Oral Movements Muscles of Facial Expression: None, normal Lips and Perioral Area: None, normal Jaw: None, normal  Tongue: None, normal,Extremity Movements Upper (arms, wrists, hands, fingers): None, normal Lower (legs, knees, ankles, toes): None, normal, Trunk Movements Neck, shoulders, hips: None, normal, Overall Severity Severity of abnormal movements (highest score from questions above): None, normal Incapacitation due to abnormal movements: None, normal Patient's awareness of abnormal movements (rate only patient's report): No Awareness, Dental Status Current problems with teeth and/or dentures?: No Does patient usually wear  dentures?: No  CIWA:    COWS:     Musculoskeletal: Strength & Muscle Tone: within normal limits Gait & Station: normal Patient leans: N/A  Psychiatric Specialty Exam:  Presentation  General Appearance:  Casual  Eye Contact: Good  Speech: Clear and Coherent (Occasional blocking)  Speech Volume: Other (comment)  Handedness: Right  Mood and Affect  Mood: Anxious; Depressed  Affect: Congruent  Thought Process  Thought Processes: -- (Improving and at baseline)  Descriptions of Associations:Loose  Orientation:Full (Time, Place and Person)  Thought Content:Paranoid Ideation  History of Schizophrenia/Schizoaffective disorder:No  Duration of Psychotic Symptoms:Less than six months  Hallucinations:Hallucinations: None   Ideas of Reference:Paranoia  Suicidal Thoughts:Suicidal Thoughts: Yes, Passive (Patient reports passive suicidal ideation rating it 50-50.  Unable to explain what 50-50 is.) SI Passive Intent and/or Plan: Without Intent; Without Plan; Without Means to Carry Out   Homicidal Thoughts:Homicidal Thoughts: No  Sensorium  Memory: Immediate Fair; Recent Fair  Judgment: Fair  Insight: Shallow  Executive Functions  Concentration: Good  Attention Span: Good  Recall: Fair  Fund of Knowledge: Fair  Language: Fair  Psychomotor Activity  Psychomotor Activity: Psychomotor Activity: Normal  Assets  Assets: Communication Skills; Desire for Improvement; Housing; Physical Health; Resilience; Social Support  Sleep  Sleep: Sleep: Good Number of Hours of Sleep: 9.75  Physical Exam: Physical Exam Vitals reviewed.  Constitutional:      General: He is not in acute distress.    Appearance: He is normal weight. He is not toxic-appearing.  HENT:     Head: Normocephalic.     Mouth/Throat:     Mouth: Mucous membranes are moist.     Pharynx: Oropharynx is clear.  Eyes:     Extraocular Movements: Extraocular movements intact.      Pupils: Pupils are equal, round, and reactive to light.  Cardiovascular:     Rate and Rhythm: Normal rate.     Pulses: Normal pulses.  Pulmonary:     Effort: Pulmonary effort is normal. No respiratory distress.  Abdominal:     Comments: Deferred  Genitourinary:    Comments: Patient presents BHP symptoms.  Started on Flomax 0.4 mg. Musculoskeletal:        General: Normal range of motion.     Cervical back: Normal range of motion.  Skin:    General: Skin is warm.  Neurological:     Mental Status: He is alert.     Motor: No weakness.     Gait: Gait normal.  Psychiatric:        Behavior: Behavior normal.    Review of Systems  Constitutional:  Negative for chills and fever.  Respiratory:  Negative for shortness of breath and wheezing.   Cardiovascular:  Negative for chest pain and palpitations.  Gastrointestinal:  Negative for heartburn and nausea.  Neurological:  Negative for dizziness, tingling, tremors and headaches.  Endo/Heme/Allergies:        See allergy listing  Psychiatric/Behavioral:  Negative for depression, hallucinations, memory loss, substance abuse and suicidal ideas. The patient is nervous/anxious. The patient does not have insomnia.   All  other systems reviewed and are negative.  Blood pressure 94/62, pulse 91, temperature (!) 97.5 F (36.4 C), temperature source Oral, resp. rate 12, height 5\' 10"  (1.778 m), weight 76.6 kg, SpO2 99%. Body mass index is 24.22 kg/m.  Treatment Plan Summary: Daily contact with patient to assess and evaluate symptoms and progress in treatment and Medication management  Continue inpatient hospitalization.  Will continue today 09/14/2022 plan as below except where it is noted.   Diagnoses / Active Problems: Brief psychotic disorder.  Unspecified psychotic disorder. Obsessive compulsive disorder.  PLAN:The risks/benefits/side-effects/alternatives to the medications in use were discussed in detail with the patient and time was  given for patient's questions. The patient consents to medication trial. -Continue Risperdal from 4 mg po Q hs for psychosis -Continue Trazodone 200 mg po Q hs for insomnia  -Discontinued Abilify to 15 mg p.o. daily 09/10/2022. -Continue Prozac 40 mg  po for  dep/anxiety/OCD type symptoms.  -Continue Ativan 0.5 mg po daily for anxiety.  -Continue Ativan 1 mg po daily for anxiety/insomnia.  -Continue propranolol 10 mg po bid for elevated heart rate.  Agitation protocols: Cont as recommended;  -Benadryl 50 mg po or IM tid prn. -Haldol 5 mg po or IM tid prn.  -Lorazepam 2 mg po or IM tid prn.   Lab ordered: ANA w/reflex if positive, Ceruloplasmin, Heavy metal profile (urine), HIV antibody (routine w/rflx), RRR, Sedimentation rate, TSH, Vit. B12.  Other medical conditions.  -Continue Albuterol inhaler 1-2 puffs Q 4 hrs prn for SOB.  -Continue Dulera 2 puffs bid for asthma. -Continue atorvastatin 20 mg po Q 1800 for hyperlipidemia.  -Continue Synthroid 100 mcg po daily for hypothyroidism.  -Continue Claritin 10 mg po daily for allergies.  -Continue Vit D3 1,000 units po daily for bone health.  -Continue Omega -3 acid 1 gm po daily for hyperlipidemia.  -Continue Singulair 10 mg po every day for breathing issues. -Continue Flomax 0.4 mg po every day for BPH symptoms. -Continue MiraLAX 17 mg p.o. daily for constipation -Continue Colace 100 mg p.o. twice daily for constipation   Other PRNS -Continue Tylenol 650 mg every 6 hours PRN for mild pain -Continue Maalox 30 ml Q 4 hrs PRN for indigestion -Continue MOM 30 ml po Q 6 hrs for constipation.  -Give magnesium citrate 1 bottle once today for constipation.   Safety and Monitoring: Voluntary admission to inpatient psychiatric unit for safety, stabilization and treatment Daily contact with patient to assess and evaluate symptoms and progress in treatment Patient's case to be discussed in multi-disciplinary team meeting Observation Level :  q15 minute checks Vital signs: q12 hours Precautions: Safety   Discharge Planning: Social work and case management to assist with discharge planning and identification of hospital follow-up needs prior to discharge Estimated LOS: 5-7 days Discharge Concerns: Need to establish a safety plan; Medication compliance and effectiveness Discharge Goals: Return home with outpatient referrals for mental health follow-up including medication management/psychotherapy  Cecilie Lowers, FNP, pmhnp, fnp-bc. 09/14/2022, 7:11 PM Patient ID: Stormy Card, male   DOB: 1961/01/07, 62 y.o.   MRN: [id number] Patient ID: Braulio Kiedrowski, male   DOB: [birth year], 62 y.o.   MRN: [id number] Patient ID: Lloyd Ayo, male   DOB: [birth year], 62 y.o.   MRN: [id number] Patient ID: Sho Salguero, male   DOB: 09/30/1960, 62 y.o.   MRN: [id number] Patient ID: Rehaan Viloria, male   DOB: 01/08/1961, 62 y.o.   MRN: [id number] Patient ID: Alyaan Budzynski, male   DOB: Jan 04, 1961, 62  y.o.   MRN: [id number]

## 2022-09-14 NOTE — Progress Notes (Signed)
   09/14/22 0548  15 Minute Checks  Location Bedroom  Visual Appearance Calm  Behavior Sleeping  Sleep (Behavioral Health Patients Only)  Calculate sleep? (Click Yes once per 24 hr at 0600 safety check) Yes  Documented sleep last 24 hours 9.75

## 2022-09-15 DIAGNOSIS — F23 Brief psychotic disorder: Secondary | ICD-10-CM | POA: Diagnosis not present

## 2022-09-15 LAB — ANA W/REFLEX IF POSITIVE: Anti Nuclear Antibody (ANA): NEGATIVE

## 2022-09-15 MED ORDER — CETIRIZINE HCL 10 MG PO TABS
10.0000 mg | ORAL_TABLET | Freq: Every day | ORAL | Status: DC
  Administered 2022-09-15 – 2022-09-18 (×4): 10 mg via ORAL
  Filled 2022-09-15 (×6): qty 1

## 2022-09-15 MED ORDER — LORATADINE 10 MG PO TABS
10.0000 mg | ORAL_TABLET | Freq: Every day | ORAL | Status: DC
  Filled 2022-09-15 (×2): qty 1

## 2022-09-15 NOTE — Group Note (Signed)
[facility] LCSW Group Therapy Note   Group Date: 09/15/2022 Start Time: 1300 End Time: 1345  Type of Therapy/Topic:  Group Therapy:  Feelings about Diagnosis  Participation Level:  Minimal   Mood: Minimally Engaged    Description of Group:    This group will allow patients to explore their thoughts and feelings about diagnoses they have received. Patients will be guided to explore their level of understanding and acceptance of these diagnoses. Facilitator will encourage patients to process their thoughts and feelings about the reactions of others to their diagnosis, and will guide patients in identifying ways to discuss their diagnosis with significant others in their lives. This group will be process-oriented, with patients participating in exploration of their own experiences as well as giving and receiving support and challenge from other group members.   Therapeutic Goals: 1. Patient will demonstrate understanding of diagnosis as evidence by identifying two or more symptoms of the disorder:  2. Patient will be able to express two feelings regarding the diagnosis 3. Patient will demonstrate ability to communicate their needs through discussion and/or role plays  Summary of Patient Progress:        Therapeutic Modalities:   Cognitive Behavioral Therapy Brief Therapy Feelings Identification    Don Hurst S Don Faciane, LCSW

## 2022-09-15 NOTE — Progress Notes (Signed)
   09/15/22 0600  15 Minute Checks  Location Bedroom  Visual Appearance Calm  Behavior Sleeping  Sleep (Behavioral Health Patients Only)  Calculate sleep? (Click Yes once per 24 hr at 0600 safety check) Yes  Documented sleep last 24 hours 7.5

## 2022-09-15 NOTE — Plan of Care (Signed)
   Problem: Education: Goal: Emotional status will improve Outcome: Progressing Goal: Mental status will improve Outcome: Progressing   Problem: Coping: Goal: Ability to demonstrate self-control will improve Outcome: Progressing   

## 2022-09-15 NOTE — Progress Notes (Signed)
   09/15/22 0825  Psych Admission Type (Psych Patients Only)  Admission Status Voluntary  Psychosocial Assessment  Patient Complaints Sleep disturbance  Eye Contact Brief  Facial Expression Flat  Affect Sad  Speech Logical/coherent  Interaction Minimal  Motor Activity Slow  Appearance/Hygiene Unremarkable  Behavior Characteristics Cooperative  Mood Sad  Thought Process  Coherency WDL  Content WDL  Delusions None reported or observed  Perception WDL  Hallucination None reported or observed  Judgment Poor  Confusion None  Danger to Self  Current suicidal ideation? Denies  Agreement Not to Harm Self Yes  Description of Agreement Verbal  Danger to Others  Danger to Others None reported or observed

## 2022-09-15 NOTE — Progress Notes (Signed)
Chaplain met with P[place] to provide emotional and spiritual support.  He has good support from his family, especially his wife.  This is his first hospitalization and he is processing having a new diagnosis. He shared that he thought he was spiritually stronger than to let something like this happen to him.  Chaplain provided education about mental health being similar to physical health and explained that the brain is an organ like anything else.  Chaplain acknowledged that religious communities sometimes tell people to depend on their faith for coping with mental health, but chaplain affirmed that his spiritual strength was not related to his mental health diagnosis. Chaplain offered prayer, at his request.  Please page [place] if other needs arise or if he requests to speak further.  Chaplain Dyanne Carrel, Bcc PAger, [phone]

## 2022-09-15 NOTE — Progress Notes (Signed)
Pt continues to endorse passive SI/ AVH, but pt stated it was getting better    09/15/22 2000  Psych Admission Type (Psych Patients Only)  Admission Status Voluntary  Psychosocial Assessment  Patient Complaints Anxiety  Eye Contact Brief  Facial Expression Flat  Affect Sad;Preoccupied  Speech Logical/coherent  Interaction Minimal  Motor Activity Slow  Appearance/Hygiene Unremarkable  Behavior Characteristics Cooperative  Mood Sad  Aggressive Behavior  Effect No apparent injury  Thought Process  Coherency WDL  Content WDL  Delusions WDL  Perception Hallucinations  Hallucination Auditory;Visual  Judgment WDL  Confusion WDL  Danger to Self  Current suicidal ideation? Passive  Self-Injurious Behavior Some self-injurious ideation observed or expressed.  No lethal plan expressed   Agreement Not to Harm Self Yes  Description of Agreement verbal contract for safety  Danger to Others  Danger to Others None reported or observed

## 2022-09-15 NOTE — Group Note (Signed)
Recreation Therapy Group Note   Group Topic:Coping Skills  Group Date: 09/15/2022 Start Time: 1030 End Time: 1055 Facilitators: Marvin Grabill-McCall, LRT,CTRS Location: [address] Dayroom   Goal Area(s) Addresses:  Patient will identify positive coping techniques. Patient will identify benefits of using coping skills post d/c.  Group Description:  Mind Map.  Patient was provided a blank template of a diagram with 32 blank boxes in a tiered system, branching from the center (similar to a bubble chart). LRT directed patients to label the middle of the diagram "Coping Skills" and consider 8 different sources in which coping skills can be used.  Patient was directed to record their sources (depression, cops, no cop outs, family problems, work, court, stress and grief) in the 2nd tier boxes closest to the center. Patients were to then come up with 3 effective coping skills to address each identified area in the remaining boxes stemming from a particular source. Pts were encouraged to share ideas with one another and ask for suggestions of peers and Clinical research associate when stuck on a certain category of stress.   Affect/Mood: Appropriate   Participation Level: Moderate   Participation Quality: Independent   Behavior: Attentive    Speech/Thought Process: Focused   Insight: Fair   Judgement: Fair    Modes of Intervention: Worksheet   Patient Response to Interventions:  Attentive and Receptive   Education Outcome:  Acknowledges education   Clinical Observations/Individualized Feedback: Pt was attentive and would engage when prompted. Pt came up with the coping skills of Excedrin PM and making sure you make it for depression and court. Pt was pleasant throughout group session.    Plan: Continue to engage patient in RT group sessions 2-3x/week.   Alicianna Litchford-McCall, LRT,CTRS 09/15/2022 1:18 PM

## 2022-09-15 NOTE — Plan of Care (Signed)
  Problem: Education: Goal: Emotional status will improve Outcome: Progressing Goal: Mental status will improve Outcome: Progressing   Problem: Activity: Goal: Sleeping patterns will improve Outcome: Progressing   Problem: Safety: Goal: Periods of time without injury will increase Outcome: Progressing   

## 2022-09-15 NOTE — Group Note (Signed)
Date:  09/15/2022 Time:  8:50 PM  Group Topic/Focus:  Wrap-Up Group:   The focus of this group is to help patients review their daily goal of treatment and discuss progress on daily workbooks.    Participation Level:  Active  Participation Quality:  Appropriate  Affect:  Appropriate  Cognitive:  Appropriate  Insight: Appropriate  Engagement in Group:  Engaged  Modes of Intervention:  Education and Exploration  Additional Comments:  Patient attended and participated in group tonight. He reports having a OK day. He slept good last night, however, he was awaken up early in the morning. He did received some more sleep today. His wife visited with him today.  [facility] 09/15/2022, 8:50 PM

## 2022-09-15 NOTE — Progress Notes (Signed)
[facility] MD Progress Note  09/15/2022 11:52 AM Don Hurst  MRN:  [id number]  Reason for admission: Don Hurst is a 62 year old AA male with no known hx of psychiatric hospitalizations, diagnoses or treatments until now. There were no hx of substance use. Patient is admitted to the [facility] from the [facility] with complaint of new unset of psychosis that comprises of paranoia, delusional thoughts/hallucinations. After the [facility] initial evaluation, patient was transferred to the [facility] for further psychiatric evaluation/treatments. A review of his current lab results has shown an elevated hgba1c 6.1 & toxicology/UDS results were negative of all illegal substances.   Yesterday the psychiatry team made the following recommendations:  -Continue Risperdal 4 mg po Q hs for psychosis -Continue Trazodone 200 mg po Q hs for insomnia  -Discontinued Abilify to 15 mg p.o. daily 09/10/2022. -Continue Prozac 40 mg  po for  depression/anxiety/OCD type symptoms.  -Continue Ativan 0.5 mg po daily for anxiety.  -Continue Ativan 1 mg po daily at bedtime for anxiety/insomnia.  -Continue propranolol 10 mg po bid for elevated heart rate.  On assessment today, the pt reports that his mood is better and less depressed Reports that anxiety is at manageable level Nursing staff report patient sleeps only 7.5 hours last night and more restful.  Thought process more organized. Appetite is good Concentration is improving with slight thought blocking which is at baseline for patient. Energy level is adequate Endorses transient suicidal thoughts, reports that his spouse has already set up an appointment with counseling services for him pending his discharge.  [facility] Chaplain in to see and talk with patient today. Denies having any HI.  Denies having psychotic symptoms, added, "the voices I used to hear in my head is much improved and I am not hearing it anymore."  Denies having side effects to current psychiatric medications.  Patient reports  that Claritin does not work well for his allergies, and requested Zyrtec.  Zyrtec 10 mg p.o. daily was ordered from the pharmacy for patient.  We discussed compliance to current medication regimen.  Patient nods in agreement and smiles.  Reports having good BMs today and yesterday.  Discussed the following psychosocial stressors: Attending therapeutic milieu and group therapies which could improve his mood.  And maintaining proper hygiene and ADLs.  Don Hurst reports, he had a good shower yesterday with change to clean clothes and he feels better.  Principal Problem: Brief psychotic disorder (HCC) Diagnosis: Principal Problem:   Brief psychotic disorder (HCC) Active Problems:   Psychosis, unspecified psychosis type (HCC)  Total Time spent with patient: 35 minutes  Past Psychiatric History: See H&P  Past Medical History:  Past Medical History:  Diagnosis Date   Allergy    Arthritis    Asthma    GERD (gastroesophageal reflux disease)    History of chronic bronchitis    Multiple allergies    Wears glasses     Past Surgical History:  Procedure Laterality Date   COLONOSCOPY     INGUINAL HERNIA REPAIR  02/2004   rt   INGUINAL HERNIA REPAIR Left 02/06/2016   Procedure: LAPAROSCOPIC LEFT INGUINAL HERNIA REPAIR;  Surgeon: Berna Bue, MD;  Location: [facility];  Service: General;  Laterality: Left;   INSERTION OF MESH Left 02/06/2016   Procedure: INSERTION OF MESH;  Surgeon: Berna Bue, MD;  Location: [facility];  Service: General;  Laterality: Left;   PAROTIDECTOMY Left 02/08/2014   Procedure: LEFT SUPERFICIAL PAROTIDECTOMY;  Surgeon: Flo Shanks, MD;  Location: [facility];  Service: ENT;  Laterality: Left;   Family History:  Family History  Problem Relation Age of Onset   Hypertension Mother        per pt dx around 59 y o   Cancer Father 76       colon   Eczema Daughter    Hypertension Sister    Family Psychiatric  History: See H&P  Social History:  Social  History   Substance and Sexual Activity  Alcohol Use No     Social History   Substance and Sexual Activity  Drug Use No    Social History   Socioeconomic History   Marital status: Married    Spouse name: Not on file   Number of children: Not on file   Years of education: Not on file   Highest education level: Not on file  Occupational History   Not on file  Tobacco Use   Smoking status: Former    Current packs/day: 0.00    Average packs/day: 0.3 packs/day for 3.0 years (0.8 ttl pk-yrs)    Types: Cigarettes    Start date: 03/25/1988    Quit date: 03/26/1991    Years since quitting: 31.4   Smokeless tobacco: Never   Tobacco comments:    smoked 1 pk every 2 wks  Vaping Use   Vaping status: Never Used  Substance and Sexual Activity   Alcohol use: No   Drug use: No   Sexual activity: Yes    Comment: number of sex partners in the last 12 months  1  Other Topics Concern   Not on file  Social History Narrative   Exercise doing weights 4 times per week for 30 minutes   Social Determinants of Health   Financial Resource Strain: Not on file  Food Insecurity: No Food Insecurity (09/05/2022)   Hunger Vital Sign    Worried About Running Out of Food in the Last Year: Never true    Ran Out of Food in the Last Year: Never true  Transportation Needs: No Transportation Needs (09/05/2022)   PRAPARE - Administrator, Civil Service (Medical): No    Lack of Transportation (Non-Medical): No  Physical Activity: Not on file  Stress: Not on file  Social Connections: Not on file   Additional Social History:    Current Medications: Current Facility-Administered Medications  Medication Dose Route Frequency Provider Last Rate Last Admin   acetaminophen (TYLENOL) tablet 650 mg  650 mg Oral Q6H PRN Bobbitt, Shalon E, NP       albuterol (VENTOLIN HFA) 108 (90 Base) MCG/ACT inhaler 1-2 puff  1-2 puff Inhalation Q4H PRN Nwoko, Agnes I, NP       alum & mag hydroxide-simeth  (MAALOX/MYLANTA) 200-200-20 MG/5ML suspension 30 mL  30 mL Oral Q4H PRN Bobbitt, Shalon E, NP   30 mL at 09/09/22 2057   atorvastatin (LIPITOR) tablet 20 mg  20 mg Oral Daily Nwoko, Nicole Kindred I, NP   20 mg at 09/15/22 7425   diphenhydrAMINE (BENADRYL) capsule 50 mg  50 mg Oral TID PRN Bobbitt, Shalon E, NP       Or   diphenhydrAMINE (BENADRYL) injection 50 mg  50 mg Intramuscular TID PRN Bobbitt, Shalon E, NP       docusate sodium (COLACE) capsule 100 mg  100 mg Oral BID Massengill, Harrold Donath, MD   100 mg at 09/15/22 0828   feeding supplement (ENSURE ENLIVE / ENSURE PLUS) liquid 237 mL  237 mL  Oral BID BM Massengill, Harrold Donath, MD   237 mL at 09/06/22 1830   FLUoxetine (PROZAC) capsule 40 mg  40 mg Oral Daily Massengill, Nathan, MD   40 mg at 09/15/22 1610   haloperidol (HALDOL) tablet 5 mg  5 mg Oral TID PRN Bobbitt, Shalon E, NP       Or   haloperidol lactate (HALDOL) injection 5 mg  5 mg Intramuscular TID PRN Bobbitt, Shalon E, NP       hydrOXYzine (ATARAX) tablet 25 mg  25 mg Oral TID PRN Bobbitt, Shalon E, NP   25 mg at 09/09/22 2053   levothyroxine (SYNTHROID) tablet 100 mcg  100 mcg Oral Daily Armandina Stammer I, NP   100 mcg at 09/15/22 0829   loratadine (CLARITIN) tablet 10 mg  10 mg Oral Daily Armandina Stammer I, NP   10 mg at 09/15/22 0829   LORazepam (ATIVAN) tablet 2 mg  2 mg Oral TID PRN Bobbitt, Shalon E, NP       Or   LORazepam (ATIVAN) injection 2 mg  2 mg Intramuscular TID PRN Bobbitt, Shalon E, NP       LORazepam (ATIVAN) tablet 0.5 mg  0.5 mg Oral Daily Massengill, Nathan, MD   0.5 mg at 09/15/22 0830   LORazepam (ATIVAN) tablet 1 mg  1 mg Oral QHS Massengill, Nathan, MD   1 mg at 09/14/22 2054   magnesium hydroxide (MILK OF MAGNESIA) suspension 30 mL  30 mL Oral Daily PRN Bobbitt, Shalon E, NP       mometasone-formoterol (DULERA) 100-5 MCG/ACT inhaler 2 puff  2 puff Inhalation BID Armandina Stammer I, NP   2 puff at 09/15/22 0827   montelukast (SINGULAIR) tablet 10 mg  10 mg Oral QHS Sindy Guadeloupe, NP   10 mg at 09/14/22 2054   omega-3 acid ethyl esters (LOVAZA) capsule 1 g  1 g Oral Daily Nwoko, Nicole Kindred I, NP   1 g at 09/15/22 0829   polyethylene glycol (MIRALAX / GLYCOLAX) packet 17 g  17 g Oral Daily Massengill, Harrold Donath, MD   17 g at 09/15/22 0828   propranolol (INDERAL) tablet 10 mg  10 mg Oral Q12H Massengill, Harrold Donath, MD   10 mg at 09/15/22 0649   risperiDONE (RISPERDAL) tablet 4 mg  4 mg Oral QHS Wyona Neils C, FNP   4 mg at 09/14/22 2052   tamsulosin (FLOMAX) capsule 0.4 mg  0.4 mg Oral QPC breakfast Massengill, Harrold Donath, MD   0.4 mg at 09/15/22 9604   traZODone (DESYREL) tablet 200 mg  200 mg Oral QHS Miah Boye C, FNP   200 mg at 09/14/22 2052   vitamin D3 (CHOLECALCIFEROL) tablet 1,000 Units  1,000 Units Oral Daily Armandina Stammer I, NP   1,000 Units at 09/15/22 0830   Lab Results:  No results found for this or any previous visit (from the past 48 hour(s)).   Blood Alcohol level:  Lab Results  Component Value Date   ETH <10 09/05/2022   Metabolic Disorder Labs: Lab Results  Component Value Date   HGBA1C 6.1 (H) 09/05/2022   MPG 128 09/05/2022   No results found for: "PROLACTIN" Lab Results  Component Value Date   CHOL 126 09/05/2022   TRIG 25 09/05/2022   HDL 52 09/05/2022   CHOLHDL 2.4 09/05/2022   VLDL 5 09/05/2022   LDLCALC 69 09/05/2022   LDLCALC 85 06/17/2022   Physical Findings: AIMS: Facial and Oral Movements Muscles of Facial Expression: None, normal Lips  and Perioral Area: None, normal Jaw: None, normal Tongue: None, normal,Extremity Movements Upper (arms, wrists, hands, fingers): None, normal Lower (legs, knees, ankles, toes): None, normal, Trunk Movements Neck, shoulders, hips: None, normal, Overall Severity Severity of abnormal movements (highest score from questions above): None, normal Incapacitation due to abnormal movements: None, normal Patient's awareness of abnormal movements (rate only patient's report): No Awareness, Dental  Status Current problems with teeth and/or dentures?: No Does patient usually wear dentures?: No  CIWA:    COWS:     Musculoskeletal: Strength & Muscle Tone: within normal limits Gait & Station: normal Patient leans: N/A  Psychiatric Specialty Exam:  Presentation  General Appearance:  Appropriate for Environment; Casual; Fairly Groomed  Eye Contact: Good  Speech: Clear and Coherent (Slightly blocked)  Speech Volume: Normal  Handedness: Right  Mood and Affect  Mood: Anxious; Depressed  Affect: Congruent  Thought Process  Thought Processes: -- (Improving, and at baseline)  Descriptions of Associations:Loose  Orientation:Full (Time, Place and Person)  Thought Content:Paranoid Ideation  History of Schizophrenia/Schizoaffective disorder:No  Duration of Psychotic Symptoms:Less than six months  Hallucinations:Hallucinations: None   Ideas of Reference:Paranoia  Suicidal Thoughts:Suicidal Thoughts: Yes, Passive (Passive patient reports that wife is setting up counseling services for him pending discharge.) SI Passive Intent and/or Plan: Without Intent; Without Plan; Without Means to Carry Out   Homicidal Thoughts:Homicidal Thoughts: No  Sensorium  Memory: Immediate Fair; Recent Fair  Judgment: Fair  Insight: Shallow  Executive Functions  Concentration: Good  Attention Span: Good  Recall: Fair  Fund of Knowledge: Fair  Language: Fair  Psychomotor Activity  Psychomotor Activity: Psychomotor Activity: Normal  Assets  Assets: Communication Skills; Desire for Improvement; Housing; Physical Health; Resilience; Social Support  Sleep  Sleep: Sleep: Good Number of Hours of Sleep: 7.5  Physical Exam: Physical Exam Vitals reviewed.  Constitutional:      General: He is not in acute distress.    Appearance: He is normal weight. He is not toxic-appearing.  HENT:     Head: Normocephalic.     Mouth/Throat:     Mouth: Mucous membranes  are moist.     Pharynx: Oropharynx is clear.  Eyes:     Extraocular Movements: Extraocular movements intact.     Pupils: Pupils are equal, round, and reactive to light.  Cardiovascular:     Rate and Rhythm: Normal rate.     Pulses: Normal pulses.  Pulmonary:     Effort: Pulmonary effort is normal. No respiratory distress.  Abdominal:     Comments: Deferred  Genitourinary:    Comments: Patient presents BHP symptoms.  Started on Flomax 0.4 mg. Musculoskeletal:        General: Normal range of motion.     Cervical back: Normal range of motion.  Skin:    General: Skin is warm.  Neurological:     Mental Status: He is alert.     Motor: No weakness.     Gait: Gait normal.  Psychiatric:        Behavior: Behavior normal.    Review of Systems  Constitutional:  Negative for chills and fever.  Respiratory:  Negative for shortness of breath and wheezing.   Cardiovascular:  Negative for chest pain and palpitations.  Gastrointestinal:  Negative for heartburn and nausea.  Neurological:  Negative for dizziness, tingling, tremors and headaches.  Endo/Heme/Allergies:        See allergy listing  Psychiatric/Behavioral:  Negative for depression, hallucinations, memory loss, substance abuse and suicidal ideas. The patient  is nervous/anxious. The patient does not have insomnia.   All other systems reviewed and are negative.  Blood pressure 92/60, pulse (!) 126, temperature 98.2 F (36.8 C), temperature source Oral, resp. rate 20, height 5\' 10"  (1.778 m), weight 76.6 kg, SpO2 96%. Body mass index is 24.22 kg/m.  Treatment Plan Summary: Daily contact with patient to assess and evaluate symptoms and progress in treatment and Medication management  Continue inpatient hospitalization.  Will continue today 09/15/2022 plan as below except where it is noted.   Diagnoses / Active Problems: Brief psychotic disorder.  Unspecified psychotic disorder. Obsessive compulsive disorder.  PLAN:The  risks/benefits/side-effects/alternatives to the medications in use were discussed in detail with the patient and time was given for patient's questions. The patient consents to medication trial. -Continue Risperdal 4 mg po Q hs for psychosis -Continue Trazodone 200 mg po Q hs for insomnia  -Discontinued Abilify to 15 mg p.o. daily 09/10/2022. -Continue Prozac 40 mg  po for  depression/anxiety/OCD type symptoms.  -Continue Ativan 0.5 mg po daily for anxiety.  -Continue Ativan 1 mg po daily at bed  at bedtime For anxiety/insomnia.  -Continue propranolol 10 mg po bid for elevated heart rate.  Agitation protocols: Cont as recommended;  -Benadryl 50 mg po or IM tid prn. -Haldol 5 mg po or IM tid prn.  -Lorazepam 2 mg po or IM tid prn.   Lab ordered: ANA w/reflex if positive, Ceruloplasmin, Heavy metal profile (urine), HIV antibody (routine w/rflx), RRR, Sedimentation rate, TSH, Vit. B12.  Other medical conditions.  -Continue Albuterol inhaler 1-2 puffs Q 4 hrs prn for SOB.  -Continue Dulera 2 puffs bid for asthma. -Continue atorvastatin 20 mg po Q 1800 for hyperlipidemia.  -Continue Synthroid 100 mcg po daily for hypothyroidism.  -Continue Claritin 10 mg po daily for allergies.  -Continue Vit D3 1,000 units po daily for bone health.  -Continue Omega -3 acid 1 gm po daily for hyperlipidemia.  -Continue Singulair 10 mg po every day for breathing issues. -Continue Flomax 0.4 mg po every day for BPH symptoms. -Continue MiraLAX 17 mg p.o. daily for constipation -Continue Colace 100 mg p.o. twice daily for constipation   Other PRNS -Continue Tylenol 650 mg every 6 hours PRN for mild pain -Continue Maalox 30 ml Q 4 hrs PRN for indigestion -Continue MOM 30 ml po Q 6 hrs for constipation.  -Give magnesium citrate 1 bottle once today for constipation.   Safety and Monitoring: Voluntary admission to inpatient psychiatric unit for safety, stabilization and treatment Daily contact with patient to  assess and evaluate symptoms and progress in treatment Patient's case to be discussed in multi-disciplinary team meeting Observation Level : q15 minute checks Vital signs: q12 hours Precautions: Safety   Discharge Planning: Social work and case management to assist with discharge planning and identification of hospital follow-up needs prior to discharge Estimated LOS: 5-7 days Discharge Concerns: Need to establish a safety plan; Medication compliance and effectiveness Discharge Goals: Return home with outpatient referrals for mental health follow-up including medication management/psychotherapy  Don Lowers, FNP, pmhnp, fnp-bc. 09/15/2022, 11:52 AM Patient ID: Stormy Card, male   DOB: October 24, 1960, 62 y.o.   MRN: [id number] Patient ID: Mackson Botz, male   DOB: 1960-11-15, 62 y.o.   MRN: [id number] Patient ID: Daran Favaro, male   DOB: [birth year], 62 y.o.   MRN: [id number] Patient ID: Brain Honeycutt, male   DOB: 01/01/1961, 62 y.o.   MRN: [id number] Patient ID: Jerimah Witucki, male   DOB: 1960/09/29, 62  y.o.   MRN: [id number] Patient ID: Azariel Banik, male   DOB: 30-Apr-1960, 62 y.o.   MRN: [id number] Patient ID: Deshan Hemmelgarn, male   DOB: [birth year], 62 y.o.   MRN: [id number]

## 2022-09-16 DIAGNOSIS — F23 Brief psychotic disorder: Secondary | ICD-10-CM | POA: Diagnosis not present

## 2022-09-16 NOTE — Group Note (Signed)
Recreation Therapy Group Note   Group Topic:Health and Wellness  Group Date: 09/16/2022 Start Time: 1040 End Time: 1120 Facilitators: Noor Witte-McCall, LRT,CTRS Location: [address] Dayroom   Goal Area(s) Addresses:  Patient will verbalize benefit of exercise during group session. Patient will identify an exercise that can be completed post d/c. Patient will acknowledge benefits of exercise when used as a coping mechanism.   Group Description: Exercise. Patients and LRT discussed the importance of exercise on the body. Patients and LRT then stretched to get loosen up muscles. Patients then took turns leading the group in the exercises of their choosing. Patients were to between 20-30 minutes of exercise. Patients were to take breaks or get water as needed.   Affect/Mood: Appropriate   Participation Level: Engaged   Participation Quality: Independent   Behavior: Appropriate   Speech/Thought Process: Focused   Insight: Moderate   Judgement: Moderate   Modes of Intervention: Music   Patient Response to Interventions:  Engaged   Education Outcome:  Acknowledges education   Clinical Observations/Individualized Feedback: Pt was quiet but engaged. Pt suggested exercises when prompted. Pt was bright and focused during group session.     Plan: Continue to engage patient in RT group sessions 2-3x/week.   Arcangel Minion-McCall, LRT,CTRS 09/16/2022 12:13 PM

## 2022-09-16 NOTE — Progress Notes (Signed)
Pt stated he was feeling a little better this evening, pt visible on the unit some this evening    09/16/22 2130  Psych Admission Type (Psych Patients Only)  Admission Status Voluntary  Psychosocial Assessment  Patient Complaints Anxiety  Eye Contact Brief  Facial Expression Flat  Affect Sad;Preoccupied  Speech Logical/coherent  Interaction Minimal  Motor Activity Slow  Appearance/Hygiene Unremarkable  Behavior Characteristics Cooperative  Mood Sad  Aggressive Behavior  Effect No apparent injury  Thought Process  Coherency WDL  Content WDL  Delusions WDL  Perception Hallucinations  Hallucination Auditory;Visual  Judgment WDL  Confusion WDL  Danger to Self  Current suicidal ideation? Passive  Self-Injurious Behavior Some self-injurious ideation observed or expressed.  No lethal plan expressed   Agreement Not to Harm Self Yes  Description of Agreement verbal contract for safety  Danger to Others  Danger to Others None reported or observed

## 2022-09-16 NOTE — Progress Notes (Signed)
[facility] MD Progress Note  09/16/2022 5:44 PM Don Hurst  MRN:  [id number]  Reason for admission: Jasean Ambrosia is a 62 year old AA male with no known hx of psychiatric hospitalizations, diagnoses or treatments until now. There were no hx of substance use. Patient is admitted to the [facility] from the [facility] with complaint of new unset of psychosis that comprises of paranoia, delusional thoughts/hallucinations. After the [facility] initial evaluation, patient was transferred to the [facility] for further psychiatric evaluation/treatments. A review of his current lab results has shown an elevated hgba1c 6.1 & toxicology/UDS results were negative of all illegal substances.   Yesterday the psychiatry team made the following recommendations:  -Continue Risperdal 4 mg po Q hs for psychosis -Continue Trazodone 200 mg po Q hs for insomnia  -Discontinued Abilify to 15 mg p.o. daily 09/10/2022. -Continue Prozac 40 mg  po for  depression/anxiety/OCD type symptoms.  -Continue Ativan 0.5 mg po daily for anxiety.  -Continue Ativan 1 mg po daily at bedtime for anxiety/insomnia.  -Continue propranolol 10 mg po bid for elevated heart rate.  On assessment today, the pt reports that his mood is better and less depressed Reports that anxiety is at manageable level.  Rates anxiety as #5 provide 10, with 10 being high severity Nursing staff report patient sleeps only 8.5 hours last night and more restful.  Thought process more organized. Appetite is good Concentration is improving with slight thought blocking which is at baseline for patient. Energy level is adequate Denies suicidal thoughts and denies intentions or plans Denies having any HI.  Denies having psychotic symptoms  Denies having side effects to current psychiatric medications.    We discussed compliance to current medication regimen.  Patient nods in agreement and smiles.    Discussed the following psychosocial stressors: Attending therapeutic milieu and group therapies  which could improve his mood.  And maintaining proper hygiene and ADLs.  Marris reports, he had a good shower yesterday with change to clean clothes and he feels better.  Principal Problem: Brief psychotic disorder (HCC) Diagnosis: Principal Problem:   Brief psychotic disorder (HCC) Active Problems:   Psychosis, unspecified psychosis type (HCC)  Total Time spent with patient: 35 minutes  Past Psychiatric History: See H&P  Past Medical History:  Past Medical History:  Diagnosis Date   Allergy    Arthritis    Asthma    GERD (gastroesophageal reflux disease)    History of chronic bronchitis    Multiple allergies    Wears glasses     Past Surgical History:  Procedure Laterality Date   COLONOSCOPY     INGUINAL HERNIA REPAIR  02/2004   rt   INGUINAL HERNIA REPAIR Left 02/06/2016   Procedure: LAPAROSCOPIC LEFT INGUINAL HERNIA REPAIR;  Surgeon: Berna Bue, MD;  Location: [facility];  Service: General;  Laterality: Left;   INSERTION OF MESH Left 02/06/2016   Procedure: INSERTION OF MESH;  Surgeon: Berna Bue, MD;  Location: [facility];  Service: General;  Laterality: Left;   PAROTIDECTOMY Left 02/08/2014   Procedure: LEFT SUPERFICIAL PAROTIDECTOMY;  Surgeon: Flo Shanks, MD;  Location: [facility];  Service: ENT;  Laterality: Left;   Family History:  Family History  Problem Relation Age of Onset   Hypertension Mother        per pt dx around 53 y o   Cancer Father 58       colon   Eczema Daughter    Hypertension Sister    Family  Psychiatric  History: See H&P  Social History:  Social History   Substance and Sexual Activity  Alcohol Use No     Social History   Substance and Sexual Activity  Drug Use No    Social History   Socioeconomic History   Marital status: Married    Spouse name: Not on file   Number of children: Not on file   Years of education: Not on file   Highest education level: Not on file  Occupational History   Not on file   Tobacco Use   Smoking status: Former    Current packs/day: 0.00    Average packs/day: 0.3 packs/day for 3.0 years (0.8 ttl pk-yrs)    Types: Cigarettes    Start date: 03/25/1988    Quit date: 03/26/1991    Years since quitting: 31.4   Smokeless tobacco: Never   Tobacco comments:    smoked 1 pk every 2 wks  Vaping Use   Vaping status: Never Used  Substance and Sexual Activity   Alcohol use: No   Drug use: No   Sexual activity: Yes    Comment: number of sex partners in the last 12 months  1  Other Topics Concern   Not on file  Social History Narrative   Exercise doing weights 4 times per week for 30 minutes   Social Determinants of Health   Financial Resource Strain: Not on file  Food Insecurity: No Food Insecurity (09/05/2022)   Hunger Vital Sign    Worried About Running Out of Food in the Last Year: Never true    Ran Out of Food in the Last Year: Never true  Transportation Needs: No Transportation Needs (09/05/2022)   PRAPARE - Administrator, Civil Service (Medical): No    Lack of Transportation (Non-Medical): No  Physical Activity: Not on file  Stress: Not on file  Social Connections: Not on file   Additional Social History:    Current Medications: Current Facility-Administered Medications  Medication Dose Route Frequency Provider Last Rate Last Admin   acetaminophen (TYLENOL) tablet 650 mg  650 mg Oral Q6H PRN Bobbitt, Shalon E, NP       albuterol (VENTOLIN HFA) 108 (90 Base) MCG/ACT inhaler 1-2 puff  1-2 puff Inhalation Q4H PRN Nwoko, Agnes I, NP       alum & mag hydroxide-simeth (MAALOX/MYLANTA) 200-200-20 MG/5ML suspension 30 mL  30 mL Oral Q4H PRN Bobbitt, Shalon E, NP   30 mL at 09/09/22 2057   atorvastatin (LIPITOR) tablet 20 mg  20 mg Oral Daily Nwoko, Nicole Kindred I, NP   20 mg at 09/16/22 1610   cetirizine (ZYRTEC) tablet 10 mg  10 mg Oral Daily Eagan Shifflett, Jesusita Oka, FNP   10 mg at 09/16/22 9604   diphenhydrAMINE (BENADRYL) capsule 50 mg  50 mg Oral TID PRN  Bobbitt, Shalon E, NP       Or   diphenhydrAMINE (BENADRYL) injection 50 mg  50 mg Intramuscular TID PRN Bobbitt, Shalon E, NP       docusate sodium (COLACE) capsule 100 mg  100 mg Oral BID Massengill, Nathan, MD   100 mg at 09/16/22 1704   feeding supplement (ENSURE ENLIVE / ENSURE PLUS) liquid 237 mL  237 mL Oral BID BM Massengill, Harrold Donath, MD   237 mL at 09/06/22 1830   FLUoxetine (PROZAC) capsule 40 mg  40 mg Oral Daily Massengill, Harrold Donath, MD   40 mg at 09/16/22 0821   haloperidol (HALDOL) tablet 5 mg  5 mg Oral TID PRN Bobbitt, Shalon E, NP       Or   haloperidol lactate (HALDOL) injection 5 mg  5 mg Intramuscular TID PRN Bobbitt, Shalon E, NP       hydrOXYzine (ATARAX) tablet 25 mg  25 mg Oral TID PRN Bobbitt, Shalon E, NP   25 mg at 09/09/22 2053   levothyroxine (SYNTHROID) tablet 100 mcg  100 mcg Oral Daily Armandina Stammer I, NP   100 mcg at 09/16/22 0272   LORazepam (ATIVAN) tablet 2 mg  2 mg Oral TID PRN Bobbitt, Shalon E, NP       Or   LORazepam (ATIVAN) injection 2 mg  2 mg Intramuscular TID PRN Bobbitt, Shalon E, NP       LORazepam (ATIVAN) tablet 0.5 mg  0.5 mg Oral Daily Massengill, Nathan, MD   0.5 mg at 09/16/22 5366   LORazepam (ATIVAN) tablet 1 mg  1 mg Oral QHS Massengill, Nathan, MD   1 mg at 09/15/22 2037   magnesium hydroxide (MILK OF MAGNESIA) suspension 30 mL  30 mL Oral Daily PRN Bobbitt, Shalon E, NP       mometasone-formoterol (DULERA) 100-5 MCG/ACT inhaler 2 puff  2 puff Inhalation BID Armandina Stammer I, NP   2 puff at 09/16/22 0821   montelukast (SINGULAIR) tablet 10 mg  10 mg Oral Dorthey Sawyer, NP   10 mg at 09/15/22 2038   omega-3 acid ethyl esters (LOVAZA) capsule 1 g  1 g Oral Daily Nwoko, Nicole Kindred I, NP   1 g at 09/16/22 4403   polyethylene glycol (MIRALAX / GLYCOLAX) packet 17 g  17 g Oral Daily Massengill, Harrold Donath, MD   17 g at 09/15/22 0828   propranolol (INDERAL) tablet 10 mg  10 mg Oral Q12H Massengill, Harrold Donath, MD   10 mg at 09/16/22 4742   risperiDONE  (RISPERDAL) tablet 4 mg  4 mg Oral QHS Terrill Alperin C, FNP   4 mg at 09/15/22 2036   tamsulosin (FLOMAX) capsule 0.4 mg  0.4 mg Oral QPC breakfast Massengill, Harrold Donath, MD   0.4 mg at 09/16/22 5956   traZODone (DESYREL) tablet 200 mg  200 mg Oral QHS Medea Deines C, FNP   200 mg at 09/15/22 2036   vitamin D3 (CHOLECALCIFEROL) tablet 1,000 Units  1,000 Units Oral Daily Armandina Stammer I, NP   1,000 Units at 09/16/22 3875   Lab Results:  No results found for this or any previous visit (from the past 48 hour(s)).   Blood Alcohol level:  Lab Results  Component Value Date   ETH <10 09/05/2022   Metabolic Disorder Labs: Lab Results  Component Value Date   HGBA1C 6.1 (H) 09/05/2022   MPG 128 09/05/2022   No results found for: "PROLACTIN" Lab Results  Component Value Date   CHOL 126 09/05/2022   TRIG 25 09/05/2022   HDL 52 09/05/2022   CHOLHDL 2.4 09/05/2022   VLDL 5 09/05/2022   LDLCALC 69 09/05/2022   LDLCALC 85 06/17/2022   Physical Findings: AIMS: Facial and Oral Movements Muscles of Facial Expression: None, normal Lips and Perioral Area: None, normal Jaw: None, normal Tongue: None, normal,Extremity Movements Upper (arms, wrists, hands, fingers): None, normal Lower (legs, knees, ankles, toes): None, normal, Trunk Movements Neck, shoulders, hips: None, normal, Overall Severity Severity of abnormal movements (highest score from questions above): None, normal Incapacitation due to abnormal movements: None, normal Patient's awareness of abnormal movements (rate only patient's report): No Awareness, Dental Status  Current problems with teeth and/or dentures?: No Does patient usually wear dentures?: No  CIWA:    COWS:     Musculoskeletal: Strength & Muscle Tone: within normal limits Gait & Station: normal Patient leans: N/A  Psychiatric Specialty Exam:  Presentation  General Appearance:  Casual; Fairly Groomed  Eye Contact: Good  Speech: Clear and Coherent; Normal  Rate  Speech Volume: Normal  Handedness: Right  Mood and Affect  Mood: Anxious; Depressed  Affect: Congruent  Thought Process  Thought Processes: Coherent (Moving and at baseline)  Descriptions of Associations:Loose  Orientation:Full (Time, Place and Person)  Thought Content:Logical  History of Schizophrenia/Schizoaffective disorder:No  Duration of Psychotic Symptoms:Less than six months  Hallucinations:Hallucinations: None   Ideas of Reference:Paranoia  Suicidal Thoughts:Suicidal Thoughts: No (Denies) SI Passive Intent and/or Plan: -- (Denies)   Homicidal Thoughts:Homicidal Thoughts: No  Sensorium  Memory: Immediate Fair; Recent Fair  Judgment: Fair  Insight: Shallow  Executive Functions  Concentration: Good  Attention Span: Good  Recall: Fair  Fund of Knowledge: Fair  Language: Fair  Psychomotor Activity  Psychomotor Activity: Psychomotor Activity: Normal  Assets  Assets: Communication Skills; Desire for Improvement; Housing; Physical Health; Resilience; Social Support  Sleep  Sleep: Sleep: Good Number of Hours of Sleep: 8.5  Physical Exam: Physical Exam Vitals reviewed.  Constitutional:      General: He is not in acute distress.    Appearance: He is normal weight. He is not toxic-appearing.  HENT:     Head: Normocephalic.     Mouth/Throat:     Mouth: Mucous membranes are moist.     Pharynx: Oropharynx is clear.  Eyes:     Extraocular Movements: Extraocular movements intact.     Pupils: Pupils are equal, round, and reactive to light.  Cardiovascular:     Rate and Rhythm: Normal rate.     Pulses: Normal pulses.  Pulmonary:     Effort: Pulmonary effort is normal. No respiratory distress.  Abdominal:     Comments: Deferred  Genitourinary:    Comments: Patient presents BHP symptoms.  Started on Flomax 0.4 mg. Musculoskeletal:        General: Normal range of motion.     Cervical back: Normal range of motion.   Skin:    General: Skin is warm.  Neurological:     Mental Status: He is alert.     Motor: No weakness.     Gait: Gait normal.  Psychiatric:        Behavior: Behavior normal.    Review of Systems  Constitutional:  Negative for chills and fever.  Respiratory:  Negative for shortness of breath and wheezing.   Cardiovascular:  Negative for chest pain and palpitations.  Gastrointestinal:  Negative for heartburn and nausea.  Neurological:  Negative for dizziness, tingling, tremors and headaches.  Endo/Heme/Allergies:        See allergy listing  Psychiatric/Behavioral:  Negative for depression, hallucinations, memory loss, substance abuse and suicidal ideas. The patient is nervous/anxious. The patient does not have insomnia.   All other systems reviewed and are negative.  Blood pressure 120/77, pulse 97, temperature 98.9 F (37.2 C), temperature source Oral, resp. rate 16, height 5\' 10"  (1.778 m), weight 76.6 kg, SpO2 98%. Body mass index is 24.22 kg/m.  Treatment Plan Summary: Daily contact with patient to assess and evaluate symptoms and progress in treatment and Medication management  Continue inpatient hospitalization.  Will continue today 09/16/2022 plan as below except where it is noted.   Diagnoses /  Active Problems: Brief psychotic disorder.  Unspecified psychotic disorder. Obsessive compulsive disorder.  PLAN:The risks/benefits/side-effects/alternatives to the medications in use were discussed in detail with the patient and time was given for patient's questions. The patient consents to medication trial. -Continue Risperdal 4 mg po Q hs for psychosis -Continue Trazodone 200 mg po Q hs for insomnia  -Discontinued Abilify to 15 mg p.o. daily 09/10/2022. -Continue Prozac 40 mg  po for  depression/anxiety/OCD type symptoms.  -Continue Ativan 0.5 mg po daily for anxiety.  -Continue Ativan 1 mg po daily at bed  at bedtime For anxiety/insomnia.  -Continue propranolol 10 mg po  bid for elevated heart rate.  Agitation protocols: Cont as recommended;  -Benadryl 50 mg po or IM tid prn. -Haldol 5 mg po or IM tid prn.  -Lorazepam 2 mg po or IM tid prn.   Lab ordered: ANA w/reflex if positive, Ceruloplasmin, Heavy metal profile (urine), HIV antibody (routine w/rflx), RRR, Sedimentation rate, TSH, Vit. B12.  Other medical conditions.  -Continue Albuterol inhaler 1-2 puffs Q 4 hrs prn for SOB.  -Continue Dulera 2 puffs bid for asthma. -Continue atorvastatin 20 mg po Q 1800 for hyperlipidemia.  -Continue Synthroid 100 mcg po daily for hypothyroidism.  -Continue Claritin 10 mg po daily for allergies.  -Continue Vit D3 1,000 units po daily for bone health.  -Continue Omega -3 acid 1 gm po daily for hyperlipidemia.  -Continue Singulair 10 mg po every day for breathing issues. -Continue Flomax 0.4 mg po every day for BPH symptoms. -Continue MiraLAX 17 mg p.o. daily for constipation -Continue Colace 100 mg p.o. twice daily for constipation   Other PRNS -Continue Tylenol 650 mg every 6 hours PRN for mild pain -Continue Maalox 30 ml Q 4 hrs PRN for indigestion -Continue MOM 30 ml po Q 6 hrs for constipation.  -Give magnesium citrate 1 bottle once today for constipation.   Safety and Monitoring: Voluntary admission to inpatient psychiatric unit for safety, stabilization and treatment Daily contact with patient to assess and evaluate symptoms and progress in treatment Patient's case to be discussed in multi-disciplinary team meeting Observation Level : q15 minute checks Vital signs: q12 hours Precautions: Safety   Discharge Planning: Social work and case management to assist with discharge planning and identification of hospital follow-up needs prior to discharge Estimated LOS: 5-7 days Discharge Concerns: Need to establish a safety plan; Medication compliance and effectiveness Discharge Goals: Return home with outpatient referrals for mental health follow-up  including medication management/psychotherapy  Cecilie Lowers, FNP. 09/16/2022, 5:44 PM Patient ID: Stormy Card, male   DOB: [birth year], 62 y.o.   MRN: [id number] Patient ID: Altonio Schwertner, male   DOB: May 24, 1960, 63 y.o.   MRN: [id number] Patient ID: Moroni Nester, male   DOB: 1960-09-08, 62 y.o.   MRN: [id number] Patient ID: Eino Whitner, male   DOB: 26-Oct-1960, 62 y.o.   MRN: [id number] Patient ID: Jaran Sainz, male   DOB: 02-Nov-1960, 62 y.o.   MRN: [id number] Patient ID: Terrence Wishon, male   DOB: [birth year], 62 y.o.   MRN: [id number] Patient ID: Ewing Fandino, male   DOB: 11/06/1960, 62 y.o.   MRN: [id number] Patient ID: Avenir Lozinski, male   DOB: 03/03/1960, 62 y.o.   MRN: [id number]

## 2022-09-16 NOTE — Plan of Care (Signed)
  Problem: Education: Goal: Knowledge of  General Education information/materials will improve Outcome: Progressing Goal: Mental status will improve Outcome: Progressing   Problem: Activity: Goal: Interest or engagement in activities will improve Outcome: Progressing Goal: Sleeping patterns will improve Outcome: Progressing   Problem: Coping: Goal: Will verbalize feelings Outcome: Progressing   Problem: Health Behavior/Discharge Planning: Goal: Compliance with prescribed medication regimen will improve Outcome: Progressing   Problem: Safety: Goal: Ability to remain free from injury will improve Outcome: Progressing   Problem: Self-Concept: Goal: Will verbalize positive feelings about self Outcome: Progressing

## 2022-09-16 NOTE — Group Note (Signed)
Date:  09/16/2022 Time:  9:32 PM  Group Topic/Focus:  Wrap-Up Group:   The focus of this group is to help patients review their daily goal of treatment and discuss progress on daily workbooks.    Participation Level:  Active  Participation Quality:  Appropriate  Affect:  Appropriate  Cognitive:  Appropriate  Insight: Appropriate  Engagement in Group:  Engaged  Modes of Intervention:  Education and Exploration  Additional Comments:  Patient attended and participated in group tonight. He reports that his goal today was to stay positive.He did accomplish his goal today.  Lita Mains [facility] 09/16/2022, 9:32 PM

## 2022-09-16 NOTE — Group Note (Unsigned)
Date:  09/16/2022 Time:  8:32 PM  Group Topic/Focus:  Wrap-Up Group:   The focus of this group is to help patients review their daily goal of treatment and discuss progress on daily workbooks.     Participation Level:  {B[facility] PARTICIPATION UEAVW:09811}  Participation Quality:  {B[facility] PARTICIPATION QUALITY:22265}  Affect:  {BHH AFFECT:22266}  Cognitive:  {B[facility] COGNITIVE:22267}  Insight: {BHH Insight2:20797}  Engagement in Group:  {BHH ENGAGEMENT IN BJYNW:29562}  Modes of Intervention:  {BHH MODES OF INTERVENTION:22269}  Additional Comments:  ***  Scot Dock 09/16/2022, 8:32 PM

## 2022-09-16 NOTE — Progress Notes (Signed)
Adult Psychoeducational Group Note  Date:  09/16/2022 Time:  9:40 AM  Group Topic/Focus:  Goals Group:   The focus of this group is to help patients establish daily goals to achieve during treatment and discuss how the patient can incorporate goal setting into their daily lives to aide in recovery.  Participation Level:  Active  Participation Quality:  Appropriate  Affect:  Appropriate  Cognitive:  Appropriate  Insight: Appropriate  Engagement in Group:  Engaged  Modes of Intervention:  Discussion  Additional Comments: The patient engage in group  Octavio Manns 09/16/2022, 9:40 AM

## 2022-09-17 ENCOUNTER — Encounter (HOSPITAL_COMMUNITY): Payer: Self-pay

## 2022-09-17 DIAGNOSIS — F23 Brief psychotic disorder: Secondary | ICD-10-CM | POA: Diagnosis not present

## 2022-09-17 NOTE — Plan of Care (Signed)
  Problem: Education: Goal: Knowledge of [place] [facility] Education information/materials will improve Outcome: Progressing Goal: Emotional status will improve Outcome: Progressing Goal: Mental status will improve Outcome: Progressing   

## 2022-09-17 NOTE — Progress Notes (Signed)
   09/17/22 2130  Psych Admission Type (Psych Patients Only)  Admission Status Voluntary  Psychosocial Assessment  Patient Complaints Anxiety  Eye Contact Brief  Facial Expression Flat  Affect Sad;Preoccupied  Speech Logical/coherent  Interaction Minimal  Motor Activity Slow  Appearance/Hygiene Unremarkable  Behavior Characteristics Cooperative  Mood Anxious;Depressed  Aggressive Behavior  Effect No apparent injury  Thought Process  Coherency WDL  Content WDL  Delusions WDL  Perception Hallucinations  Hallucination Auditory;Visual  Judgment WDL  Confusion WDL  Danger to Self  Current suicidal ideation? Passive  Self-Injurious Behavior Some self-injurious ideation observed or expressed.  No lethal plan expressed   Agreement Not to Harm Self Yes  Description of Agreement verbal contract for safety  Danger to Others  Danger to Others None reported or observed

## 2022-09-17 NOTE — [facility] Group Notes (Signed)
Adult Psychoeducational Group Note  Date:  09/17/2022 Time:  8:38 PM  Group Topic/Focus:  Wrap-Up Group:   The focus of this group is to help patients review their daily goal of treatment and discuss progress on daily workbooks.  Participation Level:  Active  Participation Quality:  Appropriate  Affect:  Appropriate  Cognitive:  Appropriate  Insight: Appropriate  Engagement in Group:  Engaged  Modes of Intervention:  Discussion  Additional Comments:  Pt. Attended group.  [name] 09/17/2022, 8:38 PM

## 2022-09-17 NOTE — Progress Notes (Signed)
Patient ID: Don Hurst, male   DOB: 1960-04-13, 62 y.o.   MRN: [id number] [facility] MD Progress Note  09/17/2022 1:41 PM Don Hurst  MRN:  [id number]  Reason for admission: Don Hurst is a 62 year old AA male with no known hx of psychiatric hospitalizations, diagnoses or treatments until now. There were no hx of substance use. Patient is admitted to the [facility] from the [facility] with complaint of new unset of psychosis that comprises of paranoia, delusional thoughts/hallucinations. After the [facility] initial evaluation, patient was transferred to the [facility] for further psychiatric evaluation/treatments. A review of his current lab results has shown an elevated hgba1c 6.1 & toxicology/UDS results were negative of all illegal substances.   Yesterday the psychiatry team made the following recommendations:  -Continue Risperdal 4 mg po Q hs for psychosis -Continue Trazodone 200 mg po Q hs for insomnia  -Discontinued Abilify to 15 mg p.o. daily 09/10/2022. -Continue Prozac 40 mg  po for  depression/anxiety/OCD type symptoms.  -Continue Ativan 0.5 mg po daily for anxiety.  -Continue Ativan 1 mg po daily at bedtime for anxiety/insomnia.  -Continue propranolol 10 mg po bid for elevated heart rate.  On assessment today, the pt reports that their mood is euthymic, improved since admission, and stable. Denies feeling down, depressed, or sad.  Reports that anxiety symptoms are at manageable level.  Sleep is stable. Appetite is stable.  Concentration is without complaint.  Energy level is adequate. Denies having any suicidal thoughts. Denies having any suicidal intent and plan.  Denies having any HI.  Denies having psychotic symptoms.   Denies having side effects to current psychiatric medications.   Discussed discharge planning: How to identify the signs of impending crisis, use of internal coping strategies, reaching out to friends and family can help  navigate a crisis, and a list of mental health professionals and  agencies to call.  Principal Problem: Brief psychotic disorder (HCC) Diagnosis: Principal Problem:   Brief psychotic disorder (HCC) Active Problems:   Psychosis, unspecified psychosis type (HCC)  Total Time spent with patient: 35 minutes  Past Psychiatric History: See H&P  Past Medical History:  Past Medical History:  Diagnosis Date   Allergy    Arthritis    Asthma    GERD (gastroesophageal reflux disease)    History of chronic bronchitis    Multiple allergies    Wears glasses     Past Surgical History:  Procedure Laterality Date   COLONOSCOPY     INGUINAL HERNIA REPAIR  02/2004   rt   INGUINAL HERNIA REPAIR Left 02/06/2016   Procedure: LAPAROSCOPIC LEFT INGUINAL HERNIA REPAIR;  Surgeon: Berna Bue, MD;  Location: [facility];  Service: General;  Laterality: Left;   INSERTION OF MESH Left 02/06/2016   Procedure: INSERTION OF MESH;  Surgeon: Berna Bue, MD;  Location: [facility];  Service: General;  Laterality: Left;   PAROTIDECTOMY Left 02/08/2014   Procedure: LEFT SUPERFICIAL PAROTIDECTOMY;  Surgeon: Flo Shanks, MD;  Location: [facility];  Service: ENT;  Laterality: Left;   Family History:  Family History  Problem Relation Age of Onset   Hypertension Mother        per pt dx around 53 y o   Cancer Father 13       colon   Eczema Daughter    Hypertension Sister    Family Psychiatric  History: See H&P  Social History:  Social History   Substance and Sexual Activity  Alcohol Use No  Social History   Substance and Sexual Activity  Drug Use No    Social History   Socioeconomic History   Marital status: Married    Spouse name: Not on file   Number of children: Not on file   Years of education: Not on file   Highest education level: Not on file  Occupational History   Not on file  Tobacco Use   Smoking status: Former    Current packs/day: 0.00    Average packs/day: 0.3 packs/day for 3.0 years (0.8 ttl pk-yrs)    Types: Cigarettes     Start date: 03/25/1988    Quit date: 03/26/1991    Years since quitting: 31.5   Smokeless tobacco: Never   Tobacco comments:    smoked 1 pk every 2 wks  Vaping Use   Vaping status: Never Used  Substance and Sexual Activity   Alcohol use: No   Drug use: No   Sexual activity: Yes    Comment: number of sex partners in the last 12 months  1  Other Topics Concern   Not on file  Social History Narrative   Exercise doing weights 4 times per week for 30 minutes   Social Determinants of Health   Financial Resource Strain: Not on file  Food Insecurity: No Food Insecurity (09/05/2022)   Hunger Vital Sign    Worried About Running Out of Food in the Last Year: Never true    Ran Out of Food in the Last Year: Never true  Transportation Needs: No Transportation Needs (09/05/2022)   PRAPARE - Administrator, Civil Service (Medical): No    Lack of Transportation (Non-Medical): No  Physical Activity: Not on file  Stress: Not on file  Social Connections: Not on file   Additional Social History:    Current Medications: Current Facility-Administered Medications  Medication Dose Route Frequency Provider Last Rate Last Admin   acetaminophen (TYLENOL) tablet 650 mg  650 mg Oral Q6H PRN Bobbitt, Shalon E, NP       albuterol (VENTOLIN HFA) 108 (90 Base) MCG/ACT inhaler 1-2 puff  1-2 puff Inhalation Q4H PRN Nwoko, Agnes I, NP       alum & mag hydroxide-simeth (MAALOX/MYLANTA) 200-200-20 MG/5ML suspension 30 mL  30 mL Oral Q4H PRN Bobbitt, Shalon E, NP   30 mL at 09/09/22 2057   atorvastatin (LIPITOR) tablet 20 mg  20 mg Oral Daily Nwoko, Nicole Kindred I, NP   20 mg at 09/17/22 0752   cetirizine (ZYRTEC) tablet 10 mg  10 mg Oral Daily Dejion Grillo, Jesusita Oka, FNP   10 mg at 09/17/22 1610   diphenhydrAMINE (BENADRYL) capsule 50 mg  50 mg Oral TID PRN Bobbitt, Shalon E, NP       Or   diphenhydrAMINE (BENADRYL) injection 50 mg  50 mg Intramuscular TID PRN Bobbitt, Shalon E, NP       docusate sodium (COLACE)  capsule 100 mg  100 mg Oral BID Massengill, Harrold Donath, MD   100 mg at 09/17/22 0752   feeding supplement (ENSURE ENLIVE / ENSURE PLUS) liquid 237 mL  237 mL Oral BID BM Massengill, Harrold Donath, MD   237 mL at 09/06/22 1830   FLUoxetine (PROZAC) capsule 40 mg  40 mg Oral Daily Massengill, Harrold Donath, MD   40 mg at 09/17/22 0752   haloperidol (HALDOL) tablet 5 mg  5 mg Oral TID PRN Bobbitt, Shalon E, NP       Or   haloperidol lactate (HALDOL) injection 5 mg  5 mg Intramuscular TID PRN Bobbitt, Shalon E, NP       hydrOXYzine (ATARAX) tablet 25 mg  25 mg Oral TID PRN Bobbitt, Shalon E, NP   25 mg at 09/09/22 2053   levothyroxine (SYNTHROID) tablet 100 mcg  100 mcg Oral Daily Armandina Stammer I, NP   100 mcg at 09/17/22 0752   LORazepam (ATIVAN) tablet 2 mg  2 mg Oral TID PRN Bobbitt, Shalon E, NP       Or   LORazepam (ATIVAN) injection 2 mg  2 mg Intramuscular TID PRN Bobbitt, Shalon E, NP       LORazepam (ATIVAN) tablet 0.5 mg  0.5 mg Oral Daily Massengill, Nathan, MD   0.5 mg at 09/17/22 0756   LORazepam (ATIVAN) tablet 1 mg  1 mg Oral QHS Massengill, Nathan, MD   1 mg at 09/16/22 2105   magnesium hydroxide (MILK OF MAGNESIA) suspension 30 mL  30 mL Oral Daily PRN Bobbitt, Shalon E, NP       mometasone-formoterol (DULERA) 100-5 MCG/ACT inhaler 2 puff  2 puff Inhalation BID Armandina Stammer I, NP   2 puff at 09/17/22 0752   montelukast (SINGULAIR) tablet 10 mg  10 mg Oral Dorthey Sawyer, NP   10 mg at 09/16/22 2105   omega-3 acid ethyl esters (LOVAZA) capsule 1 g  1 g Oral Daily Nwoko, Nicole Kindred I, NP   1 g at 09/17/22 0752   polyethylene glycol (MIRALAX / GLYCOLAX) packet 17 g  17 g Oral Daily Massengill, Harrold Donath, MD   17 g at 09/17/22 0753   propranolol (INDERAL) tablet 10 mg  10 mg Oral Q12H Massengill, Harrold Donath, MD   10 mg at 09/17/22 0752   risperiDONE (RISPERDAL) tablet 4 mg  4 mg Oral QHS Gershom Brobeck C, FNP   4 mg at 09/16/22 2105   tamsulosin (FLOMAX) capsule 0.4 mg  0.4 mg Oral QPC breakfast Massengill, Harrold Donath, MD    0.4 mg at 09/17/22 6962   traZODone (DESYREL) tablet 200 mg  200 mg Oral QHS Madelynn Malson C, FNP   200 mg at 09/16/22 2105   vitamin D3 (CHOLECALCIFEROL) tablet 1,000 Units  1,000 Units Oral Daily Armandina Stammer I, NP   1,000 Units at 09/17/22 9528   Lab Results:  No results found for this or any previous visit (from the past 48 hour(s)).   Blood Alcohol level:  Lab Results  Component Value Date   ETH <10 09/05/2022   Metabolic Disorder Labs: Lab Results  Component Value Date   HGBA1C 6.1 (H) 09/05/2022   MPG 128 09/05/2022   No results found for: "PROLACTIN" Lab Results  Component Value Date   CHOL 126 09/05/2022   TRIG 25 09/05/2022   HDL 52 09/05/2022   CHOLHDL 2.4 09/05/2022   VLDL 5 09/05/2022   LDLCALC 69 09/05/2022   LDLCALC 85 06/17/2022   Physical Findings: AIMS: Facial and Oral Movements Muscles of Facial Expression: None, normal Lips and Perioral Area: None, normal Jaw: None, normal Tongue: None, normal,Extremity Movements Upper (arms, wrists, hands, fingers): None, normal Lower (legs, knees, ankles, toes): None, normal, Trunk Movements Neck, shoulders, hips: None, normal, Overall Severity Severity of abnormal movements (highest score from questions above): None, normal Incapacitation due to abnormal movements: None, normal Patient's awareness of abnormal movements (rate only patient's report): No Awareness, Dental Status Current problems with teeth and/or dentures?: No Does patient usually wear dentures?: No  CIWA:    COWS:     Musculoskeletal: Strength &  Muscle Tone: within normal limits Gait & Station: normal Patient leans: N/A  Psychiatric Specialty Exam:  Presentation  General Appearance:  Casual; Fairly Groomed; Appropriate for Environment  Eye Contact: Good  Speech: Clear and Coherent; Normal Rate  Speech Volume: Normal  Handedness: Right  Mood and Affect  Mood: Euthymic  Affect: Congruent  Thought Process  Thought  Processes: Coherent; Goal Directed (Improving)  Descriptions of Associations:Intact  Orientation:Full (Time, Place and Person)  Thought Content:Logical  History of Schizophrenia/Schizoaffective disorder:No  Duration of Psychotic Symptoms:Less than six months  Hallucinations:Hallucinations: None  Ideas of Reference:None  Suicidal Thoughts:Suicidal Thoughts: No SI Passive Intent and/or Plan: -- (Denies)  Homicidal Thoughts:Homicidal Thoughts: No  Sensorium  Memory: Immediate Fair; Recent Fair  Judgment: Fair  Insight: Shallow  Executive Functions  Concentration: Good  Attention Span: Good  Recall: Fair  Fund of Knowledge: Fair  Language: Fair  Psychomotor Activity  Psychomotor Activity: Psychomotor Activity: Normal  Assets  Assets: Communication Skills; Desire for Improvement; Housing; Physical Health; Resilience; Social Support  Sleep  Sleep: Sleep: Good Number of Hours of Sleep: 9  Physical Exam: Physical Exam Vitals reviewed.  Constitutional:      General: He is not in acute distress.    Appearance: He is normal weight. He is not toxic-appearing.  HENT:     Head: Normocephalic.     Mouth/Throat:     Mouth: Mucous membranes are moist.     Pharynx: Oropharynx is clear.  Eyes:     Extraocular Movements: Extraocular movements intact.     Pupils: Pupils are equal, round, and reactive to light.  Cardiovascular:     Rate and Rhythm: Normal rate.     Pulses: Normal pulses.  Pulmonary:     Effort: Pulmonary effort is normal. No respiratory distress.  Abdominal:     Comments: Deferred  Genitourinary:    Comments: Patient presents BHP symptoms.  Started on Flomax 0.4 mg. Musculoskeletal:        General: Normal range of motion.     Cervical back: Normal range of motion.  Skin:    General: Skin is warm.  Neurological:     Mental Status: He is alert.     Motor: No weakness.     Gait: Gait normal.  Psychiatric:        Mood and Affect:  Mood normal.        Behavior: Behavior normal.    Review of Systems  Constitutional:  Negative for chills and fever.  Respiratory:  Negative for shortness of breath and wheezing.   Cardiovascular:  Negative for chest pain and palpitations.  Gastrointestinal:  Negative for heartburn and nausea.  Neurological:  Negative for dizziness, tingling, tremors and headaches.  Endo/Heme/Allergies:        See allergy listing  Psychiatric/Behavioral:  Negative for depression, hallucinations, memory loss, substance abuse and suicidal ideas. The patient is nervous/anxious. The patient does not have insomnia.   All other systems reviewed and are negative.  Blood pressure 90/64, pulse 97, temperature 98.4 F (36.9 C), temperature source Oral, resp. rate 16, height 5\' 10"  (1.778 m), weight 76.6 kg, SpO2 99%. Body mass index is 24.22 kg/m.  Treatment Plan Summary: Daily contact with patient to assess and evaluate symptoms and progress in treatment and Medication management  Continue inpatient hospitalization.  Will continue today 09/17/2022 plan as below except where it is noted.   Diagnoses / Active Problems: Brief psychotic disorder.  Unspecified psychotic disorder. Obsessive compulsive disorder.  PLAN:The risks/benefits/side-effects/alternatives to  the medications in use were discussed in detail with the patient and time was given for patient's questions. The patient consents to medication trial. -Continue Risperdal 4 mg po Q hs for psychosis -Continue Trazodone 200 mg po Q hs for insomnia  -Discontinued Abilify to 15 mg p.o. daily 09/10/2022. -Continue Prozac 40 mg  po for  depression/anxiety/OCD type symptoms.  -Continue Ativan 0.5 mg po daily for anxiety.  -Continue Ativan 1 mg po daily at bed  at bedtime For anxiety/insomnia.  -Continue propranolol 10 mg po bid for elevated heart rate.  Agitation protocols: Cont as recommended;  -Benadryl 50 mg po or IM tid prn. -Haldol 5 mg po or IM tid  prn.  -Lorazepam 2 mg po or IM tid prn.   Lab ordered: ANA w/reflex if positive, Ceruloplasmin, Heavy metal profile (urine), HIV antibody (routine w/rflx), RRR, Sedimentation rate, TSH, Vit. B12.  Other medical conditions.  -Continue Albuterol inhaler 1-2 puffs Q 4 hrs prn for SOB.  -Continue Dulera 2 puffs bid for asthma. -Continue atorvastatin 20 mg po Q 1800 for hyperlipidemia.  -Continue Synthroid 100 mcg po daily for hypothyroidism.  -Continue Claritin 10 mg po daily for allergies.  -Continue Vit D3 1,000 units po daily for bone health.  -Continue Omega -3 acid 1 gm po daily for hyperlipidemia.  -Continue Singulair 10 mg po every day for breathing issues. -Continue Flomax 0.4 mg po every day for BPH symptoms. -Continue MiraLAX 17 mg p.o. daily for constipation -Continue Colace 100 mg p.o. twice daily for constipation   Other PRNS -Continue Tylenol 650 mg every 6 hours PRN for mild pain -Continue Maalox 30 ml Q 4 hrs PRN for indigestion -Continue MOM 30 ml po Q 6 hrs for constipation.  -Give magnesium citrate 1 bottle once today for constipation.   Safety and Monitoring: Voluntary admission to inpatient psychiatric unit for safety, stabilization and treatment Daily contact with patient to assess and evaluate symptoms and progress in treatment Patient's case to be discussed in multi-disciplinary team meeting Observation Level : q15 minute checks Vital signs: q12 hours Precautions: Safety   Discharge Planning: Social work and case management to assist with discharge planning and identification of hospital follow-up needs prior to discharge Estimated LOS: 5-7 days Discharge Concerns: Need to establish a safety plan; Medication compliance and effectiveness Discharge Goals: Return home with outpatient referrals for mental health follow-up including medication management/psychotherapy  Cecilie Lowers, FNP. 09/17/2022, 1:41 PM Patient ID: Stormy Card, male   DOB: September 24, 1960, 62 y.o.    MRN: [id number] Patient ID: Ashad Fawbush, male   DOB: Oct 02, 1960, 62 y.o.   MRN: [id number] Patient ID: Seon Gaertner, male   DOB: [birth year], 62 y.o.   MRN: [id number] Patient ID: Olander Friedl, male   DOB: 1960-05-02, 62 y.o.   MRN: [id number] Patient ID: Denys Salinger, male   DOB: Mar 11, 1960, 62 y.o.   MRN: [id number] Patient ID: Eriq Hufford, male   DOB: 01-Sep-1960, 62 y.o.   MRN: [id number] Patient ID: Orley Lawry, male   DOB: [birth year], 62 y.o.   MRN: [id number] Patient ID: Kahlil Cowans, male   DOB: 25-Mar-1960, 62 y.o.   MRN: [id number]

## 2022-09-17 NOTE — Progress Notes (Signed)
Collateral information: Patient's spouse [name] Dougal at [phone] called regarding patient's pending discharge tomorrow.  Spouse in agreement to patient being discharged, however, she is concerned about what she needs to do after discharge.  Made patient's wife aware that we would discharge the patient with 1 month supply of his medications which will be picked up at the pharmacy of his choice at [facility] drug store [place]  Also concerned about if a neurologist will be able to look at the results of the CT scan if needed.  Made patient and wife aware that the CT scan results is in the epic system and the neurologist if needed could access the results directly from the system.  Reports that she will be at the hospital at [facility] at 11 AM to pick patient to home.  Alan Mulder, NP Psychiatry.

## 2022-09-17 NOTE — Progress Notes (Signed)
   09/17/22 0800  Psych Admission Type (Psych Patients Only)  Admission Status Voluntary  Psychosocial Assessment  Patient Complaints Anxiety  Eye Contact Brief  Facial Expression Flat  Affect Depressed  Speech Logical/coherent  Interaction Minimal  Motor Activity Slow  Appearance/Hygiene Unremarkable  Behavior Characteristics Cooperative  Mood Sad  Thought Process  Coherency WDL  Content WDL  Delusions WDL  Perception WDL  Hallucination None reported or observed  Judgment Poor  Confusion None  Danger to Self  Current suicidal ideation? Passive  Agreement Not to Harm Self Yes  Description of Agreement Contract for safety

## 2022-09-17 NOTE — Group Note (Signed)
Recreation Therapy Group Note   Group Topic:Leisure Education  Group Date: 09/17/2022 Start Time: 1000 End Time: 1030 Facilitators: Jasma Seevers-McCall, LRT,CTRS Location: [address] Dayroom   Goal Area(s) Addresses:  Patient will successfully identify positive leisure and recreation activities.  Patient will acknowledge benefits of participation in healthy leisure activities post discharge.  Patient will actively work with peers toward a shared goal.    Group Description: Pictionary. In groups of 5-7, patients took turns trying to guess the picture being drawn on the board by their teammate.  If the team guessed the correct answer, they won a point.  If the team guessed wrong, the other team got a chance to steal the point. After several rounds of game play, the team with the most points were declared winners. Post-activity discussion reviewed benefits of positive recreation outlets: reducing stress, improving coping mechanisms, increasing self-esteem, and building larger support systems.   Affect/Mood: N/A   Participation Level: Did not attend    Clinical Observations/Individualized Feedback:     Plan: Continue to engage patient in RT group sessions 2-3x/week.   Aubrionna Istre-McCall, LRT,CTRS 09/17/2022 11:19 AM

## 2022-09-17 NOTE — BH IP Treatment Plan (Signed)
Interdisciplinary Treatment and Diagnostic Plan Update  09/17/2022 Time of Session: 1300 [address] MRN: [id number]  Principal Diagnosis: Brief psychotic disorder [facility])  Secondary Diagnoses: Principal Problem:   Brief psychotic disorder (HCC) Active Problems:   Psychosis, unspecified psychosis type (HCC)   Current Medications:  Current Facility-Administered Medications  Medication Dose Route Frequency Provider Last Rate Last Admin   acetaminophen (TYLENOL) tablet 650 mg  650 mg Oral Q6H PRN Bobbitt, Shalon E, NP       albuterol (VENTOLIN HFA) 108 (90 Base) MCG/ACT inhaler 1-2 puff  1-2 puff Inhalation Q4H PRN Nwoko, Agnes I, NP       alum & mag hydroxide-simeth (MAALOX/MYLANTA) 200-200-20 MG/5ML suspension 30 mL  30 mL Oral Q4H PRN Bobbitt, Shalon E, NP   30 mL at 09/09/22 2057   atorvastatin (LIPITOR) tablet 20 mg  20 mg Oral Daily Nwoko, Nicole Kindred I, NP   20 mg at 09/17/22 0752   cetirizine (ZYRTEC) tablet 10 mg  10 mg Oral Daily Ntuen, Jesusita Oka, FNP   10 mg at 09/17/22 6269   diphenhydrAMINE (BENADRYL) capsule 50 mg  50 mg Oral TID PRN Bobbitt, Shalon E, NP       Or   diphenhydrAMINE (BENADRYL) injection 50 mg  50 mg Intramuscular TID PRN Bobbitt, Shalon E, NP       docusate sodium (COLACE) capsule 100 mg  100 mg Oral BID Massengill, Harrold Donath, MD   100 mg at 09/17/22 4854   feeding supplement (ENSURE ENLIVE / ENSURE PLUS) liquid 237 mL  237 mL Oral BID BM Massengill, Nathan, MD   237 mL at 09/06/22 1830   FLUoxetine (PROZAC) capsule 40 mg  40 mg Oral Daily Massengill, Harrold Donath, MD   40 mg at 09/17/22 6270   haloperidol (HALDOL) tablet 5 mg  5 mg Oral TID PRN Bobbitt, Shalon E, NP       Or   haloperidol lactate (HALDOL) injection 5 mg  5 mg Intramuscular TID PRN Bobbitt, Shalon E, NP       hydrOXYzine (ATARAX) tablet 25 mg  25 mg Oral TID PRN Bobbitt, Shalon E, NP   25 mg at 09/09/22 2053   levothyroxine (SYNTHROID) tablet 100 mcg  100 mcg Oral Daily Armandina Stammer I, NP   100 mcg at 09/17/22  0752   LORazepam (ATIVAN) tablet 2 mg  2 mg Oral TID PRN Bobbitt, Shalon E, NP       Or   LORazepam (ATIVAN) injection 2 mg  2 mg Intramuscular TID PRN Bobbitt, Shalon E, NP       LORazepam (ATIVAN) tablet 0.5 mg  0.5 mg Oral Daily Massengill, Nathan, MD   0.5 mg at 09/17/22 0756   LORazepam (ATIVAN) tablet 1 mg  1 mg Oral QHS Massengill, Nathan, MD   1 mg at 09/16/22 2105   magnesium hydroxide (MILK OF MAGNESIA) suspension 30 mL  30 mL Oral Daily PRN Bobbitt, Shalon E, NP       mometasone-formoterol (DULERA) 100-5 MCG/ACT inhaler 2 puff  2 puff Inhalation BID Armandina Stammer I, NP   2 puff at 09/17/22 0752   montelukast (SINGULAIR) tablet 10 mg  10 mg Oral QHS Sindy Guadeloupe, NP   10 mg at 09/16/22 2105   omega-3 acid ethyl esters (LOVAZA) capsule 1 g  1 g Oral Daily Nwoko, Nicole Kindred I, NP   1 g at 09/17/22 0752   polyethylene glycol (MIRALAX / GLYCOLAX) packet 17 g  17 g Oral Daily Massengill, Harrold Donath, MD  17 g at 09/17/22 0753   propranolol (INDERAL) tablet 10 mg  10 mg Oral Q12H Massengill, Harrold Donath, MD   10 mg at 09/17/22 0752   risperiDONE (RISPERDAL) tablet 4 mg  4 mg Oral QHS Ntuen, Jesusita Oka, FNP   4 mg at 09/16/22 2105   tamsulosin (FLOMAX) capsule 0.4 mg  0.4 mg Oral QPC breakfast Massengill, Harrold Donath, MD   0.4 mg at 09/17/22 2595   traZODone (DESYREL) tablet 200 mg  200 mg Oral QHS Cecilie Lowers, FNP   200 mg at 09/16/22 2105   vitamin D3 (CHOLECALCIFEROL) tablet 1,000 Units  1,000 Units Oral Daily Armandina Stammer I, NP   1,000 Units at 09/17/22 6387   PTA Medications: Medications Prior to Admission  Medication Sig Dispense Refill Last Dose   atorvastatin (LIPITOR) 20 MG tablet TAKE 1 TABLET(20 MG) BY MOUTH DAILY (Patient taking differently: Take 20 mg by mouth daily.) 90 tablet 1    budesonide-formoterol (SYMBICORT) 80-4.5 MCG/ACT inhaler Inhale 2 puffs into the lungs 2 (two) times daily. (Patient taking differently: Inhale 2 puffs into the lungs 2 (two) times daily as needed (For shortness of  breath).) 10.2 g 2    cetirizine (ZYRTEC) 10 MG tablet Take 10 mg by mouth daily.      chlorpheniramine (CHLOR-TRIMETON) 4 MG tablet Take 4 mg by mouth daily.      Cholecalciferol (VITAMIN D PO) Take 1 tablet by mouth daily.       Cyanocobalamin (VITAMIN B 12 PO) Take 1 tablet by mouth daily.      ELDERBERRY PO Take 1 tablet by mouth daily.      EPINEPHrine 0.3 mg/0.3 mL IJ SOAJ injection Inject 0.3 mg into the muscle as needed for anaphylaxis.      levothyroxine (SYNTHROID) 100 MCG tablet TAKE 1 TABLET(100 MCG) BY MOUTH DAILY BEFORE BREAKFAST (Patient taking differently: Take 100 mcg by mouth daily.) 90 tablet 1    montelukast (SINGULAIR) 10 MG tablet Take 1 tablet (10 mg total) by mouth at bedtime. 90 tablet 3    Omega-3 Fatty Acids (FISH OIL) 1000 MG CPDR Take 1,000 mg by mouth daily.      PROAIR HFA 108 (90 Base) MCG/ACT inhaler 1-2 puffs every 4-6 hours as needed for shortness of breath or wheezing (Patient taking differently: 1-2 puffs every 4 (four) hours as needed for wheezing or shortness of breath.) 8.5 g 0    tetrahydrozoline 0.05 % ophthalmic solution Place 1-2 drops into both eyes 4 (four) times daily as needed (For eye irritation).      vitamin C (ASCORBIC ACID) 500 MG tablet Take 500 mg by mouth daily.      VITAMIN E PO Take 1 tablet by mouth daily.        Patient Stressors: Other: "Paranoia"    Patient Strengths: Ability for insight  Average or above average intelligence  Capable of independent living  Education administrator  Motivation for treatment/growth  Religious Affiliation  Supportive family/friends   Treatment Modalities: Medication Management, Group therapy, Case management,  1 to 1 session with clinician, Psychoeducation, Recreational therapy.   Physician Treatment Plan for Primary Diagnosis: Brief psychotic disorder (HCC) Long Term Goal(s): Improvement in symptoms so as ready for discharge   Short Term Goals: Ability to identify and develop  effective coping behaviors will improve Ability to maintain clinical measurements within normal limits will improve Compliance with prescribed medications will improve Ability to identify changes in lifestyle to reduce recurrence of condition  will improve Ability to verbalize feelings will improve Ability to disclose and discuss suicidal ideas Ability to demonstrate self-control will improve  Medication Management: Evaluate patient's response, side effects, and tolerance of medication regimen.  Therapeutic Interventions: 1 to 1 sessions, Unit Group sessions and Medication administration.  Evaluation of Outcomes: Progressing  Physician Treatment Plan for Secondary Diagnosis: Principal Problem:   Brief psychotic disorder (HCC) Active Problems:   Psychosis, unspecified psychosis type (HCC)  Long Term Goal(s): Improvement in symptoms so as ready for discharge   Short Term Goals: Ability to identify and develop effective coping behaviors will improve Ability to maintain clinical measurements within normal limits will improve Compliance with prescribed medications will improve Ability to identify changes in lifestyle to reduce recurrence of condition will improve Ability to verbalize feelings will improve Ability to disclose and discuss suicidal ideas Ability to demonstrate self-control will improve     Medication Management: Evaluate patient's response, side effects, and tolerance of medication regimen.  Therapeutic Interventions: 1 to 1 sessions, Unit Group sessions and Medication administration.  Evaluation of Outcomes: Progressing   RN Treatment Plan for Primary Diagnosis: Brief psychotic disorder (HCC) Long Term Goal(s): Knowledge of disease and therapeutic regimen to maintain health will improve  Short Term Goals: Ability to remain free from injury will improve, Ability to verbalize frustration and anger appropriately will improve, Ability to demonstrate self-control, Ability  to participate in decision making will improve, Ability to verbalize feelings will improve, Ability to disclose and discuss suicidal ideas, Ability to identify and develop effective coping behaviors will improve, and Compliance with prescribed medications will improve  Medication Management: RN will administer medications as ordered by provider, will assess and evaluate patient's response and provide education to patient for prescribed medication. RN will report any adverse and/or side effects to prescribing provider.  Therapeutic Interventions: 1 on 1 counseling sessions, Psychoeducation, Medication administration, Evaluate responses to treatment, Monitor vital signs and CBGs as ordered, Perform/monitor CIWA, COWS, AIMS and Fall Risk screenings as ordered, Perform wound care treatments as ordered.  Evaluation of Outcomes: Progressing   LCSW Treatment Plan for Primary Diagnosis: Brief psychotic disorder [facility]) Long Term Goal(s): Safe transition to appropriate next level of care at discharge, Engage patient in therapeutic group addressing interpersonal concerns.  Short Term Goals: Engage patient in aftercare planning with referrals and resources, Increase social support, Increase ability to appropriately verbalize feelings, Increase emotional regulation, Facilitate acceptance of mental health diagnosis and concerns, Facilitate patient progression through stages of change regarding substance use diagnoses and concerns, Identify triggers associated with mental health/substance abuse issues, and Increase skills for wellness and recovery  Therapeutic Interventions: Assess for all discharge needs, 1 to 1 time with Social worker, Explore available resources and support systems, Assess for adequacy in community support network, Educate family and significant other(s) on suicide prevention, Complete Psychosocial Assessment, Interpersonal group therapy.  Evaluation of Outcomes: Progressing   Progress in  Treatment: Attending groups: Yes. Participating in groups: Yes. Taking medication as prescribed: Yes. Toleration medication: Yes. Family/Significant other contact made: Yes, individual(s) contacted:  - wife [name] Kimmet [phone]. Patient understands diagnosis: Yes. Discussing patient identified problems/goals with staff: Yes. Medical problems stabilized or resolved: Yes. Denies suicidal/homicidal ideation: Yes. Issues/concerns per patient self-inventory: Yes. Other: N/A  New problem(s) identified: No, Describe:  None Reported  New Short Term/Long Term Goal(s): medication stabilization, elimination of SI thoughts, development of comprehensive mental wellness plan.   Patient Goals:  Coping Skills  Discharge Plan or Barriers: CSW will continue to  follow and assess for appropriate referrals and possible discharge planning.   Reason for Continuation of Hospitalization: Delusions  Hallucinations Medication stabilization  Estimated Length of Stay: 5-7 Days  Last 3 [place] Suicide Severity Risk Score: Flowsheet Row [facility]Current[facility] Most recent reading at 09/05/2022 11:55 PM ED from 09/05/2022 in [facility] Most recent reading at 09/05/2022 11:59 AM Video Visit from 08/26/2022 in [facility] Most recent reading at 08/26/2022  1:23 PM  C-SSRS RISK CATEGORY No Risk No Risk No Risk       Last PHQ 2/9 Scores:    08/26/2022    1:23 PM 06/11/2021   11:20 AM 12/04/2020   10:22 AM  Depression screen PHQ 2/9  Decreased Interest 3 0 0  Down, Depressed, Hopeless 3 0 0  PHQ - 2 Score 6 0 0  Altered sleeping 2    Tired, decreased energy 1    Change in appetite 0    Feeling bad or failure about yourself  1    Trouble concentrating 1    Moving slowly or fidgety/restless 1    Suicidal thoughts 1    PHQ-9 Score 13    Difficult doing work/chores Extremely dIfficult       medication stabilization, elimination of SI thoughts, development of comprehensive mental wellness plan.    Scribe for Treatment Team: Ane Payment, LCSW 09/17/2022 3:12 PM

## 2022-09-17 NOTE — Group Note (Signed)
Date:  09/17/2022 Time:  10:00 AM  Group Topic/Focus:  Goals Group:   The focus of this group is to help patients establish daily goals to achieve during treatment and discuss how the patient can incorporate goal setting into their daily lives to aide in recovery.    Participation Level:  Did Not Attend  Additional Comments:  Patient was encouraged to attend group multiple times.   Auriana Scalia T Mikenzi Raysor 09/17/2022, 10:00 AM

## 2022-09-17 NOTE — Plan of Care (Signed)

## 2022-09-18 DIAGNOSIS — F23 Brief psychotic disorder: Secondary | ICD-10-CM | POA: Diagnosis not present

## 2022-09-18 MED ORDER — HYDROXYZINE HCL 25 MG PO TABS: 25.0000 mg | ORAL_TABLET | Freq: Three times a day (TID) | ORAL | 0 refills | Status: AC | PRN

## 2022-09-18 MED ORDER — RISPERIDONE 4 MG PO TABS: 4.0000 mg | ORAL_TABLET | Freq: Every day | ORAL | 0 refills | Status: AC

## 2022-09-18 MED ORDER — MOMETASONE FURO-FORMOTEROL FUM 100-5 MCG/ACT IN AERO: 2.0000 | INHALATION_SPRAY | Freq: Two times a day (BID) | RESPIRATORY_TRACT | 0 refills | Status: AC

## 2022-09-18 MED ORDER — CETIRIZINE HCL 10 MG PO TABS: 10.0000 mg | ORAL_TABLET | Freq: Every day | ORAL | 0 refills | Status: AC

## 2022-09-18 MED ORDER — ATORVASTATIN CALCIUM 20 MG PO TABS: 20.0000 mg | ORAL_TABLET | Freq: Every day | ORAL | 0 refills | Status: AC

## 2022-09-18 MED ORDER — TAMSULOSIN HCL 0.4 MG PO CAPS: 0.4000 mg | ORAL_CAPSULE | Freq: Every day | ORAL | 0 refills | Status: AC

## 2022-09-18 MED ORDER — LEVOTHYROXINE SODIUM 100 MCG PO TABS: 100.0000 ug | ORAL_TABLET | Freq: Every day | ORAL | 0 refills | Status: AC

## 2022-09-18 MED ORDER — POLYETHYLENE GLYCOL 3350 17 G PO PACK: 17.0000 g | PACK | Freq: Every day | ORAL | 0 refills | Status: AC

## 2022-09-18 MED ORDER — FLUOXETINE HCL 40 MG PO CAPS: 40.0000 mg | ORAL_CAPSULE | Freq: Every day | ORAL | 3 refills | Status: AC

## 2022-09-18 MED ORDER — DOCUSATE SODIUM 100 MG PO CAPS: 100.0000 mg | ORAL_CAPSULE | Freq: Two times a day (BID) | ORAL | 0 refills | Status: AC

## 2022-09-18 MED ORDER — ALBUTEROL SULFATE HFA 108 (90 BASE) MCG/ACT IN AERS: 1.0000 | INHALATION_SPRAY | RESPIRATORY_TRACT | 0 refills | Status: AC | PRN

## 2022-09-18 MED ORDER — TRAZODONE HCL 100 MG PO TABS: 200.0000 mg | ORAL_TABLET | Freq: Every day | ORAL | 0 refills | Status: AC

## 2022-09-18 MED ORDER — VITAMIN D3 25 MCG PO TABS: 1000.0000 [IU] | ORAL_TABLET | Freq: Every day | ORAL | 0 refills | Status: AC

## 2022-09-18 MED ORDER — PROPRANOLOL HCL 10 MG PO TABS: 10.0000 mg | ORAL_TABLET | Freq: Two times a day (BID) | ORAL | 0 refills | Status: AC

## 2022-09-18 MED ORDER — OMEGA-3-ACID ETHYL ESTERS 1 G PO CAPS: 1.0000 g | ORAL_CAPSULE | Freq: Every day | ORAL | 0 refills | Status: AC

## 2022-09-18 NOTE — Discharge Summary (Signed)
Physician Discharge Summary Note  Patient:  Don Hurst is an 62 y.o., male MRN:  [id number] DOB:  1960/09/01 Patient phone:  [phone] (home)  Patient address:   [address] [place] [address],   Total Time spent with patient: 30 minutes  Date of Admission:  09/05/2022 Date of Discharge:   09/18/2022  Reason for Admission:  This is the first psychiatric admission/assessment in this [facility] for this 62 year old AA male with no known hx of psychiatric hospitalizations, diagnoses or treatments until now. There were no hx of substance use. Patient is admitted to the [facility] from the [facility] with complaint of new unset of psychosis that comprises of paranoia, delusional thoughts/hallucinations. After the [facility] initial evaluation, patient was transferred to the [facility] for further psychiatric evaluation/treatments. A review of his current lab results has shown an elevated hgba1c 6.1 & toxicology/UDS results were negative of all illegal substances.   Principal Problem: Brief psychotic disorder [facility]) Discharge Diagnoses: Principal Problem:   Brief psychotic disorder (HCC) Active Problems:   Psychosis, unspecified psychosis type (HCC)  Past Psychiatric History:  None reported by patient or wife.   Past Medical History:  Past Medical History:  Diagnosis Date   Allergy    Arthritis    Asthma    GERD (gastroesophageal reflux disease)    History of chronic bronchitis    Multiple allergies    Wears glasses     Past Surgical History:  Procedure Laterality Date   COLONOSCOPY     INGUINAL HERNIA REPAIR  02/2004   rt   INGUINAL HERNIA REPAIR Left 02/06/2016   Procedure: LAPAROSCOPIC LEFT INGUINAL HERNIA REPAIR;  Surgeon: Berna Bue, MD;  Location: [facility];  Service: General;  Laterality: Left;   INSERTION OF MESH Left 02/06/2016   Procedure: INSERTION OF MESH;  Surgeon: Berna Bue, MD;  Location: [facility];  Service: General;  Laterality: Left;   PAROTIDECTOMY Left 02/08/2014   Procedure:  LEFT SUPERFICIAL PAROTIDECTOMY;  Surgeon: Flo Shanks, MD;  Location: [facility];  Service: ENT;  Laterality: Left;   Family History:  Family History  Problem Relation Age of Onset   Hypertension Mother        per pt dx around 54 y o   Cancer Father 17       colon   Eczema Daughter    Hypertension Sister    Family Psychiatric  History: See H&P  Social History:  Social History   Substance and Sexual Activity  Alcohol Use No     Social History   Substance and Sexual Activity  Drug Use No    Social History   Socioeconomic History   Marital status: Married    Spouse name: Not on file   Number of children: Not on file   Years of education: Not on file   Highest education level: Not on file  Occupational History   Not on file  Tobacco Use   Smoking status: Former    Current packs/day: 0.00    Average packs/day: 0.3 packs/day for 3.0 years (0.8 ttl pk-yrs)    Types: Cigarettes    Start date: 03/25/1988    Quit date: 03/26/1991    Years since quitting: 31.5   Smokeless tobacco: Never   Tobacco comments:    smoked 1 pk every 2 wks  Vaping Use   Vaping status: Never Used  Substance and Sexual Activity   Alcohol use: No   Drug use: No   Sexual activity:  Yes    Comment: number of sex partners in the last 12 months  1  Other Topics Concern   Not on file  Social History Narrative   Exercise doing weights 4 times per week for 30 minutes   Social Determinants of Health   Financial Resource Strain: Not on file  Food Insecurity: No Food Insecurity (09/05/2022)   Hunger Vital Sign    Worried About Running Out of Food in the Last Year: Never true    Ran Out of Food in the Last Year: Never true  Transportation Needs: No Transportation Needs (09/05/2022)   PRAPARE - Administrator, Civil Service (Medical): No    Lack of Transportation (Non-Medical): No  Physical Activity: Not on file  Stress: Not on file  Social Connections: Not on file    Hospital Course:  During the patient's hospitalization, patient had extensive initial psychiatric evaluation, and follow-up psychiatric evaluations every day.  Psychiatric diagnoses provided upon initial assessment:  Brief psychotic disorder (HCC) Active Problems:   Psychosis, unspecified psychosis type (HCC)  Patient's psychiatric medications were adjusted on admission:  -Initiated Abilify 5 mg po daily for psychosis.  -Initiated Prozac 10 mg po daily for depression.  -Continue Hydroxyzine 25 mg po tid prn for anxiety.  -Continue Trazodone 50 mg po Q hs prn for insomnia.   During the hospitalization, other adjustments were made to the patient's psychiatric medication regimen:  Abilify was discontinued due to noneffectiveness Risperdal 4 mg p.o. daily at bedtime was initiated for psychosis Prozac was increased to 40 mg p.o. daily for depression  Patient's care was discussed during the interdisciplinary team meeting every day during the hospitalization.  The patient denies having side effects to prescribed psychiatric medication.  Gradually, patient started adjusting to milieu. The patient was evaluated each day by a clinical provider to ascertain response to treatment. Improvement was noted by the patient's report of decreasing symptoms, improved sleep and appetite, affect, medication tolerance, behavior, and participation in unit programming.  Patient was asked each day to complete a self inventory noting mood, mental status, pain, new symptoms, anxiety and concerns.    Symptoms were reported as significantly decreased or resolved completely by discharge.   On day of discharge, the patient reports that their mood is stable. The patient denied having suicidal thoughts for more than 48 hours prior to discharge.  Patient denies having homicidal thoughts.  Patient denies having auditory hallucinations.  Patient denies any visual hallucinations or other symptoms of psychosis. The patient  was motivated to continue taking medication with a goal of continued improvement in mental health.   The patient reports their target psychiatric symptoms of psychosis responded well to the psychiatric medications, and the patient reports overall benefit other psychiatric hospitalization. Supportive psychotherapy was provided to the patient. The patient also participated in regular group therapy while hospitalized. Coping skills, problem solving as well as relaxation therapies were also part of the unit programming.  Labs were reviewed with the patient, and abnormal results were discussed with the patient.  The patient is able to verbalize their individual safety plan to this provider.  # It is recommended to the patient to continue psychiatric medications as prescribed, after discharge from the hospital.    # It is recommended to the patient to follow up with your outpatient psychiatric provider and PCP.  # It was discussed with the patient, the impact of alcohol, drugs, tobacco have been there overall psychiatric and medical wellbeing, and total abstinence  from substance use was recommended the patient.ed.  # Prescriptions provided or sent directly to preferred pharmacy at discharge. Patient agreeable to plan. Given opportunity to ask questions. Appears to feel comfortable with discharge.    # In the event of worsening symptoms, the patient is instructed to call the crisis hotline, 911 and or go to the nearest ED for appropriate evaluation and treatment of symptoms. To follow-up with primary care provider for other medical issues, concerns and or health care needs  # Patient was discharged to home with a plan to follow up as noted below.  Physical Findings: AIMS: Facial and Oral Movements Muscles of Facial Expression: None, normal Lips and Perioral Area: None, normal Jaw: None, normal Tongue: None, normal,Extremity Movements Upper (arms, wrists, hands, fingers): None, normal Lower (legs,  knees, ankles, toes): None, normal, Trunk Movements Neck, shoulders, hips: None, normal, Overall Severity Severity of abnormal movements (highest score from questions above): None, normal Incapacitation due to abnormal movements: None, normal Patient's awareness of abnormal movements (rate only patient's report): No Awareness, Dental Status Current problems with teeth and/or dentures?: No Does patient usually wear dentures?: No  CIWA:    COWS:     Musculoskeletal: Strength & Muscle Tone: within normal limits Gait & Station: normal Patient leans: N/A  Psychiatric Specialty Exam:  Presentation  General Appearance:  Casual; Fairly Groomed; Appropriate for Environment  Eye Contact: Good  Speech: Clear and Coherent; Normal Rate  Speech Volume: Normal  Handedness: Right  Mood and Affect  Mood: Euthymic  Affect: Congruent  Thought Process  Thought Processes: Coherent  Descriptions of Associations:Intact  Orientation:Full (Time, Place and Person)  Thought Content:Logical  History of Schizophrenia/Schizoaffective disorder:No  Duration of Psychotic Symptoms:Less than six months  Hallucinations:Hallucinations: None  Ideas of Reference:None  Suicidal Thoughts:Suicidal Thoughts: No SI Passive Intent and/or Plan: -- (Denies)  Homicidal Thoughts:Homicidal Thoughts: No  Sensorium  Memory: Immediate Fair; Recent Fair  Judgment: Fair  Insight: Present  Executive Functions  Concentration: Good  Attention Span: Good  Recall: Fair  Fund of Knowledge: Fair  Language: Good  Psychomotor Activity  Psychomotor Activity: Psychomotor Activity: Normal  Assets  Assets: Communication Skills; Desire for Improvement; Housing; Health and safety inspector; Physical Health; Resilience; Social Support  Sleep  Sleep: Sleep: Good Number of Hours of Sleep: 8    Physical Exam: Physical Exam Vitals and nursing note reviewed.  HENT:     Head:  Normocephalic.     Nose: Nose normal.     Mouth/Throat:     Mouth: Mucous membranes are moist.     Pharynx: Oropharynx is clear.  Eyes:     Extraocular Movements: Extraocular movements intact.     Pupils: Pupils are equal, round, and reactive to light.  Cardiovascular:     Rate and Rhythm: Tachycardia present.     Comments: Blood pressure 94/66, pulse 104.  Patient is asymptomatic.  Nursing staff to recheck vital signs prior to discharge. Pulmonary:     Effort: Pulmonary effort is normal.  Abdominal:     Comments: Deferred  Genitourinary:    Comments: Deferred Musculoskeletal:        General: Normal range of motion.     Cervical back: Normal range of motion.  Skin:    General: Skin is warm.  Neurological:     General: No focal deficit present.     Mental Status: He is alert and oriented to person, place, and time.  Psychiatric:        Mood and Affect: Mood  normal.        Behavior: Behavior normal.        Thought Content: Thought content normal.    Review of Systems  Constitutional:  Negative for chills and fever.  HENT:  Negative for sore throat.   Eyes:        Wears reading glasses  Respiratory:  Negative for cough, shortness of breath and wheezing.   Cardiovascular:  Negative for chest pain and palpitations.       Blood pressure 94/66, pulse 104.  Patient is asymptomatic.  Nursing staff to recheck vital signs prior to discharge.  Gastrointestinal:  Negative for abdominal pain, heartburn, nausea and vomiting.  Genitourinary:  Negative for dysuria.  Musculoskeletal: Negative.   Skin:  Negative for itching and rash.  Neurological:  Negative for dizziness, tingling, tremors and headaches.  Endo/Heme/Allergies:        See allergy listing  Psychiatric/Behavioral:  Positive for depression (Stable with medication) and hallucinations (Stable with medication). The patient is nervous/anxious (Improved with medication) and has insomnia (Improved with medication).    Blood  pressure 94/66, pulse (!) 104, temperature 98.3 F (36.8 C), temperature source Oral, resp. rate 18, height 5\' 10"  (1.778 m), weight 76.6 kg, SpO2 97%. Body mass index is 24.22 kg/m.  Social History   Tobacco Use  Smoking Status Former   Current packs/day: 0.00   Average packs/day: 0.3 packs/day for 3.0 years (0.8 ttl pk-yrs)   Types: Cigarettes   Start date: 03/25/1988   Quit date: 03/26/1991   Years since quitting: 31.5  Smokeless Tobacco Never  Tobacco Comments   smoked 1 pk every 2 wks   Tobacco Cessation:  A prescription for an FDA-approved tobacco cessation medication was offered at discharge and the patient refused  Blood Alcohol level:  Lab Results  Component Value Date   ETH <10 09/05/2022    Metabolic Disorder Labs:  Lab Results  Component Value Date   HGBA1C 6.1 (H) 09/05/2022   MPG 128 09/05/2022   No results found for: "PROLACTIN" Lab Results  Component Value Date   CHOL 126 09/05/2022   TRIG 25 09/05/2022   HDL 52 09/05/2022   CHOLHDL 2.4 09/05/2022   VLDL 5 09/05/2022   LDLCALC 69 09/05/2022   LDLCALC 85 06/17/2022    See Psychiatric Specialty Exam and Suicide Risk Assessment completed by Attending Physician prior to discharge.  Discharge destination:  Home  Is patient on multiple antipsychotic therapies at discharge:  No   Has Patient had three or more failed trials of antipsychotic monotherapy by history:  No  Recommended Plan for Multiple Antipsychotic Therapies: NA  Discharge Instructions     Increase activity slowly   Complete by: As directed       Allergies as of 09/18/2022       Reactions   Bee Pollen Anaphylaxis, Shortness Of Breath   Cheese Diarrhea, Other (See Comments)   GI upset   Egg-derived Products Diarrhea, Other (See Comments)   GI upset   Lactose Intolerance (gi) Diarrhea, Other (See Comments)   GI upset   Other Diarrhea, Other (See Comments)   [place]onnnaise        Medication List     STOP taking these  medications    ascorbic acid 500 MG tablet Commonly known as: VITAMIN C   budesonide-formoterol 80-4.5 MCG/ACT inhaler Commonly known as: SYMBICORT Replaced by: mometasone-formoterol 100-5 MCG/ACT Aero   chlorpheniramine 4 MG tablet Commonly known as: CHLOR-TRIMETON   ELDERBERRY PO   EPINEPHrine  0.3 mg/0.3 mL Soaj injection Commonly known as: EPI-PEN   Fish Oil 1000 MG Cpdr Replaced by: omega-3 acid ethyl esters 1 g capsule   tetrahydrozoline 0.05 % ophthalmic solution   VITAMIN B 12 PO   VITAMIN E PO       TAKE these medications      Indication  albuterol 108 (90 Base) MCG/ACT inhaler Commonly known as: ProAir HFA Inhale 1-2 puffs into the lungs every 4 (four) hours as needed for wheezing or shortness of breath. What changed:  how much to take how to take this when to take this reasons to take this additional instructions  Indication: Chronic Obstructive Lung Disease   atorvastatin 20 MG tablet Commonly known as: LIPITOR Take 1 tablet (20 mg total) by mouth daily. Start taking on: September 19, 2022 What changed: See the new instructions.  Indication: High Amount of Fats in the Blood   cetirizine 10 MG tablet Commonly known as: ZYRTEC Take 1 tablet (10 mg total) by mouth daily. Start taking on: September 19, 2022  Indication: Hayfever   docusate sodium 100 MG capsule Commonly known as: COLACE Take 1 capsule (100 mg total) by mouth 2 (two) times daily.  Indication: Constipation   FLUoxetine 40 MG capsule Commonly known as: PROZAC Take 1 capsule (40 mg total) by mouth daily. Start taking on: September 19, 2022  Indication: Major Depressive Disorder   hydrOXYzine 25 MG tablet Commonly known as: ATARAX Take 1 tablet (25 mg total) by mouth 3 (three) times daily as needed for anxiety.  Indication: Feeling Anxious   levothyroxine 100 MCG tablet Commonly known as: SYNTHROID Take 1 tablet (100 mcg total) by mouth daily. Start taking on: September 19, 2022  Indication:  Underactive Thyroid   mometasone-formoterol 100-5 MCG/ACT Aero Commonly known as: DULERA Inhale 2 puffs into the lungs 2 (two) times daily. Replaces: budesonide-formoterol 80-4.5 MCG/ACT inhaler  Indication: Intermittent Asthma   montelukast 10 MG tablet Commonly known as: SINGULAIR Take 1 tablet (10 mg total) by mouth at bedtime.  Indication: Asthma   omega-3 acid ethyl esters 1 g capsule Commonly known as: LOVAZA Take 1 capsule (1 g total) by mouth daily. Start taking on: September 19, 2022 Replaces: Fish Oil 1000 MG Cpdr  Indication: High Amount of Triglycerides in the Blood   polyethylene glycol 17 g packet Commonly known as: MIRALAX / GLYCOLAX Take 17 g by mouth daily. Start taking on: September 19, 2022  Indication: Constipation   propranolol 10 MG tablet Commonly known as: INDERAL Take 1 tablet (10 mg total) by mouth every 12 (twelve) hours.  Indication: High Blood Pressure Disorder   risperidone 4 MG tablet Commonly known as: RISPERDAL Take 1 tablet (4 mg total) by mouth at bedtime.  Indication: Major Depressive Disorder   tamsulosin 0.4 MG Caps capsule Commonly known as: FLOMAX Take 1 capsule (0.4 mg total) by mouth daily after breakfast. Start taking on: September 19, 2022  Indication: Benign Enlargement of Prostate   traZODone 100 MG tablet Commonly known as: DESYREL Take 2 tablets (200 mg total) by mouth at bedtime.  Indication: Trouble Sleeping   vitamin D3 25 MCG tablet Commonly known as: CHOLECALCIFEROL Take 1 tablet (1,000 Units total) by mouth daily. Start taking on: September 19, 2022 What changed:  medication strength how much to take  Indication: Vitamin D Deficiency        Follow-up Information     [facility] Follow up on 09/19/2022.   Why: You have a hospital follow up  appointment for therapy and medication management services on 09/19/22 at 8:30 am.  This will be a Virtual telehealth appointment. Contact information: [address]  [address] [place] [address] [phone]                 Follow-up recommendations:   Discharge Recommendations:  The patient is being discharged to Home. Patient is to take his discharge medications as ordered.  See follow up above. We recommend that he participates in individual therapy to target uncontrollable agitation.  We recommend that he participates in therapy to target personal conflict, to improve communication skills and conflict resolution skills. Patient is to initiate/implement a contingency based behavioral model to address his behavior. We recommend that he gets AIMS scale, height, weight, blood pressure, fasting lipid panel, fasting blood sugar in three months from discharge if he's on atypical antipsychotics.  Patient will benefit from monitoring of recurrent suicidal ideation since patient is on antidepressant medication. The patient should abstain from all illicit substances and alcohol. If the patient's symptoms worsen or do not continue to improve or if the patient becomes actively suicidal or homicidal then it is recommended that the patient return to the closest hospital emergency room or call 911 for further evaluation and treatment. National Suicide Prevention Lifeline 1800-SUICIDE or 541-606-1649. Please follow up with your primary medical doctor for all other medical needs.  The patient has been educated on the possible side effects to medications and she/her guardian is to contact a medical professional and inform outpatient provider of any new side effects of medication. He is to take regular diet and activity as tolerated.  Will benefit from moderate daily exercise. Patient and Family was educated about removing/locking any firearms, medications or dangerous products from the home.  Activity:  As tolerated Diet:  Regular Diet  Signed: Cecilie Lowers, FNP 09/18/2022, 9:42 AM

## 2022-09-18 NOTE — [facility] Suicide Risk Assessment (Signed)
Suicide Risk Assessment  Discharge Assessment    [facility] Discharge Suicide Risk Assessment   Principal Problem: Brief psychotic disorder [facility]) Discharge Diagnoses: Principal Problem:   Brief psychotic disorder (HCC) Active Problems:   Psychosis, unspecified psychosis type (HCC)  Reason for admission:   This is the first psychiatric admission/assessment in this [facility] for this 62 year old AA male with no known hx of psychiatric hospitalizations, diagnoses or treatments until now. There were no hx of substance use. Patient is admitted to the [facility] from the [facility] with complaint of new unset of psychosis that comprises of paranoia, delusional thoughts/hallucinations. After the [facility] initial evaluation, patient was transferred to the [facility] for further psychiatric evaluation/treatments. A review of his current lab results has shown an elevated hgba1c 6.1 & toxicology/UDS results were negative of all illegal substances.   Total Time spent with patient: 30 minutes  Musculoskeletal: Strength & Muscle Tone: within normal limits Gait & Station: normal Patient leans: N/A  Psychiatric Specialty Exam  Presentation  General Appearance:  Casual; Fairly Groomed; Appropriate for Environment  Eye Contact: Good  Speech: Clear and Coherent; Normal Rate  Speech Volume: Normal  Handedness: Right  Mood and Affect  Mood: Euthymic  Duration of Depression Symptoms: Greater than two weeks  Affect: Congruent  Thought Process  Thought Processes: Coherent  Descriptions of Associations:Intact  Orientation:Full (Time, Place and Person)  Thought Content:Logical  History of Schizophrenia/Schizoaffective disorder:No  Duration of Psychotic Symptoms:Less than six months  Hallucinations:Hallucinations: None  Ideas of Reference:None  Suicidal Thoughts:Suicidal Thoughts: No SI Passive Intent and/or Plan: -- (Denies)  Homicidal Thoughts:Homicidal Thoughts: No  Sensorium  Memory: Immediate Fair;  Recent Fair  Judgment: Fair  Insight: Present   Executive Functions  Concentration: Good  Attention Span: Good  Recall: Fair  Fund of Knowledge: Fair  Language: Good  Psychomotor Activity  Psychomotor Activity: Psychomotor Activity: Normal  Assets  Assets: Communication Skills; Desire for Improvement; Housing; Health and safety inspector; Physical Health; Resilience; Social Support  Sleep  Sleep: Sleep: Good Number of Hours of Sleep: 8  Physical Exam: Physical Exam Vitals and nursing note reviewed.  HENT:     Head: Normocephalic.     Nose: Nose normal.     Mouth/Throat:     Mouth: Mucous membranes are moist.     Pharynx: Oropharynx is clear.  Eyes:     Extraocular Movements: Extraocular movements intact.     Pupils: Pupils are equal, round, and reactive to light.  Cardiovascular:     Rate and Rhythm: Tachycardia present.     Comments: Blood pressure 94/66, pulse 104.  Patient is asymptomatic.  Nursing staff to recheck vital signs prior to discharge Pulmonary:     Effort: Pulmonary effort is normal.  Abdominal:     Comments: Deferred  Genitourinary:    Comments: Deferred Musculoskeletal:        General: Normal range of motion.     Cervical back: Normal range of motion.  Skin:    General: Skin is warm.  Neurological:     General: No focal deficit present.     Mental Status: He is alert and oriented to person, place, and time. Mental status is at baseline.  Psychiatric:        Mood and Affect: Mood normal.        Behavior: Behavior normal.        Thought Content: Thought content normal.    Review of Systems  Constitutional:  Negative for chills, diaphoresis, fever and malaise/fatigue.  HENT:  Negative for sore throat.   Eyes:        Wears reading glasses  Cardiovascular:  Negative for chest pain and palpitations.  Gastrointestinal:  Negative for abdominal pain, heartburn, nausea and vomiting.  Genitourinary:  Negative for dysuria.   Musculoskeletal: Negative.   Neurological:  Negative for dizziness, tingling, tremors and headaches.  Endo/Heme/Allergies:        See allergy listing  Psychiatric/Behavioral:  Positive for depression (Stable with medication) and hallucinations (Stable with medication). The patient is nervous/anxious (Improved with medication) and has insomnia (Improved with medication).    Blood pressure 94/66, pulse (!) 104, temperature 98.3 F (36.8 C), temperature source Oral, resp. rate 18, height 5\' 10"  (1.778 m), weight 76.6 kg, SpO2 97%. Body mass index is 24.22 kg/m.  Mental Status Per Nursing Assessment::   On Admission:  Suicidal ideation indicated by patient  Demographic Factors:  Male and Unemployed  Loss Factors: NA  Historical Factors: NA  Risk Reduction Factors:   Positive social support, Positive therapeutic relationship, and Positive coping skills or problem solving skills  Continued Clinical Symptoms:  Depression:   Insomnia Recent sense of peace/wellbeing Medical Diagnoses and Treatments/Surgeries  Cognitive Features That Contribute To Risk:  Polarized thinking    Suicide Risk:  Mild:  Suicidal ideation of limited frequency, intensity, duration, and specificity.  There are no identifiable plans, no associated intent, mild dysphoria and related symptoms, good self-control (both objective and subjective assessment), few other risk factors, and identifiable protective factors, including available and accessible social support.   Follow-up Information     [facility] Follow up on 09/19/2022.   Why: You have a hospital follow up appointment for therapy and medication management services on 09/19/22 at 8:30 am.  This will be a Virtual telehealth appointment. Contact information: [address]  [address] [place] [address] [phone]                 Plan Of Care/Follow-up recommendations:  Discharge Recommendations:  The patient is being discharged to  Home. Patient is to take his discharge medications as ordered.  See follow up above. We recommend that he participates in individual therapy to target uncontrollable agitation and substance abuse.  We recommend that he participates in therapy to target personal conflict, to improve communication skills and conflict resolution skills. Patient is to initiate/implement a contingency based behavioral model to address his behavior. We recommend that he gets AIMS scale, height, weight, blood pressure, fasting lipid panel, fasting blood sugar in three months from discharge if he's on atypical antipsychotics.  Patient will benefit from monitoring of recurrent suicidal ideation since patient is on antidepressant medication. The patient should abstain from all illicit substances and alcohol. If the patient's symptoms worsen or do not continue to improve or if the patient becomes actively suicidal or homicidal then it is recommended that the patient return to the closest hospital emergency room or call 911 for further evaluation and treatment. National Suicide Prevention Lifeline 1800-SUICIDE or (718)481-4394. Please follow up with your primary medical doctor for all other medical needs.  The patient has been educated on the possible side effects to medications and she/her guardian is to contact a medical professional and inform outpatient provider of any new side effects of medication. He is to take regular diet and activity as tolerated.  Will benefit from moderate daily exercise. Patient and Family was educated about removing/locking any firearms, medications or dangerous products from the home.  Activity:  As tolerated Diet:  Regular  Diet   Cecilie Lowers, FNP 09/18/2022, 11:12 AM

## 2022-09-18 NOTE — Group Note (Signed)
Recreation Therapy Group Note   Group Topic:General Recreation  Group Date: 09/18/2022 Start Time: 0957 End Time: 1027 Facilitators: Jadine Brumley-McCall, LRT,CTRS Location: [address] Dayroom   Goal Area(s) Addresses:  Patient will use appropriate interactions in play with peers.      Group Description: Keep It Going Volleyball.  Patients were placed in circle. Patients hit a beach ball to each other for as long as possible. LRT kept the time for how the ball was in motion. Patients were to keep the ball moving at all times. If the ball came to a stop at any point, LRT would restart the time. LRT also played music to give the activity a beach vibe.   Affect/Mood: Appropriate   Participation Level: Engaged   Participation Quality: Independent   Behavior: Appropriate   Speech/Thought Process: Focused   Insight: Good   Judgement: Good   Modes of Intervention: Cooperative Play   Patient Response to Interventions:  Engaged   Education Outcome:  Acknowledges education   Clinical Observations/Individualized Feedback:  Patient played appropriately with peers. Patient was quiet but bright during group session. Patient would respond when prompted. Patients were instructed on the benefits of exercise, as well as, how often and for how long physical activity should be done to lead to a healthy lifestyle.    Plan: Continue to engage patient in RT group sessions 2-3x/week.   Don Hurst, LRT,CTRS 09/18/2022 12:10 PM

## 2022-09-18 NOTE — Progress Notes (Signed)
  [facility] Adult Case Management Discharge Plan :  Will you be returning to the same living situation after discharge:  Yes,  wife [name] Coffie [phone]. At discharge, do you have transportation home?: Yes,  wife [name] Allers [phone]. Do you have the ability to pay for your medications: Yes,  Insured [employer]  Release of information consent forms completed and in the chart;  Patient's signature needed at discharge.  Patient to Follow up at:  Follow-up Information     [facility] Follow up on 09/19/2022.   Why: You have a hospital follow up appointment for therapy and medication management services on 09/19/22 at 8:30 am.  This will be a Virtual telehealth appointment. Contact information: [address]  [address] [place] [address] [phone]                 Next level of care provider has access to [facility] Link:no  Safety Planning and Suicide Prevention discussed: Yes,  wife [name] Riemenschneider [phone].     Has patient been referred to the Quitline?: Patient refused referral for treatment  Patient has been referred for addiction treatment: No known substance use disorder. Patient to continue working towards treatment goals after discharge. Patient no longer meets criteria for inpatient criteria per attending physician. Continue taking medications as prescribed, nursing to provide instructions at discharge. Follow up with all scheduled appointments.   Marykathryn Carboni S Aurelio Mccamy, LCSW 09/18/2022, 9:13 AM

## 2022-09-18 NOTE — Plan of Care (Signed)
Patient did not complete goal due to not much interaction during group sessions. Patient would engage at times when prompted. However, patient would not elaborate on any answers he may have given during group sessions.   Aujanae Mccullum-McCall, LRT,CTRS

## 2022-09-18 NOTE — Progress Notes (Signed)
Pt discharged to lobby. Pt was stable and appreciative at that time. All papers and prescriptions were given and valuables returned. Verbal understanding expressed. Denies SI/HI and A/VH. Pt given opportunity to express concerns and ask questions.  

## 2022-09-18 NOTE — Progress Notes (Signed)
Recreation Therapy Notes  INPATIENT RECREATION TR PLAN  Patient Details Name: Don Hurst MRN: [id number] DOB: 1961/02/07 Today's Date: 09/18/2022  Rec Therapy Plan Is patient appropriate for Therapeutic Recreation?: Yes Treatment times per week: about 3 days Estimated Length of Stay: 5-7 days TR Treatment/Interventions: Group participation (Comment)  Discharge Criteria Pt will be discharged from therapy if:: Discharged Treatment plan/goals/alternatives discussed and agreed upon by:: Patient/family  Discharge Summary Short term goals set: See patient care plan Short term goals met: Not met Progress toward goals comments: Groups attended Which groups?: Self-esteem, Wellness, Communication, Coping skills, Other (Comment) (General Recreation; Problem Solving) Reason goals not met: Patient was limited in interactions even with prompting. Therapeutic equipment acquired: N/A Reason patient discharged from therapy: Discharge from hospital Pt/family agrees with progress & goals achieved: Yes Date patient discharged from therapy: 09/18/22    Zia Kanner-McCall, LRT,CTRS Kyser Wandel A Onnie Hatchel-McCall 09/18/2022, 12:33 PM

## 2022-09-19 DIAGNOSIS — F331 Major depressive disorder, recurrent, moderate: Secondary | ICD-10-CM | POA: Diagnosis not present

## 2022-09-26 NOTE — H&P (Signed)
I called poison control to discuss elevated arsenic level.  I provided them with the clinician with information they requested.   Poinson control recommend 24 hour urine collection, with correct container, as the next step for work-up.   Avoid seafood and well water.   I called pt's wife and we discussed elevated arsenic level, and recommended the follow-up with PCP for ordering of 24 hour urine collection for arsenic level, then re-consult poison control for next steps after those results.

## 2022-09-29 DIAGNOSIS — F331 Major depressive disorder, recurrent, moderate: Secondary | ICD-10-CM | POA: Diagnosis not present

## 2022-10-03 DIAGNOSIS — E039 Hypothyroidism, unspecified: Secondary | ICD-10-CM | POA: Diagnosis not present

## 2022-10-03 DIAGNOSIS — T570X1D Toxic effect of arsenic and its compounds, accidental (unintentional), subsequent encounter: Secondary | ICD-10-CM | POA: Diagnosis not present

## 2022-10-03 DIAGNOSIS — E782 Mixed hyperlipidemia: Secondary | ICD-10-CM | POA: Diagnosis not present

## 2022-10-03 DIAGNOSIS — F29 Unspecified psychosis not due to a substance or known physiological condition: Secondary | ICD-10-CM | POA: Diagnosis not present

## 2022-10-07 DIAGNOSIS — T570X1D Toxic effect of arsenic and its compounds, accidental (unintentional), subsequent encounter: Secondary | ICD-10-CM | POA: Diagnosis not present

## 2022-11-03 DIAGNOSIS — Z23 Encounter for immunization: Secondary | ICD-10-CM | POA: Diagnosis not present

## 2022-11-03 DIAGNOSIS — F29 Unspecified psychosis not due to a substance or known physiological condition: Secondary | ICD-10-CM | POA: Diagnosis not present

## 2022-11-03 DIAGNOSIS — E039 Hypothyroidism, unspecified: Secondary | ICD-10-CM | POA: Diagnosis not present

## 2022-11-03 DIAGNOSIS — E782 Mixed hyperlipidemia: Secondary | ICD-10-CM | POA: Diagnosis not present

## 2022-11-19 ENCOUNTER — Ambulatory Visit: Payer: Federal, State, Local not specified - PPO | Admitting: Allergy

## 2022-12-16 ENCOUNTER — Ambulatory Visit: Payer: Self-pay | Admitting: Family Medicine

## 2022-12-22 ENCOUNTER — Ambulatory Visit: Payer: Federal, State, Local not specified - PPO | Admitting: Internal Medicine

## 2023-01-06 DIAGNOSIS — Z0000 Encounter for general adult medical examination without abnormal findings: Secondary | ICD-10-CM | POA: Diagnosis not present

## 2023-01-06 DIAGNOSIS — E039 Hypothyroidism, unspecified: Secondary | ICD-10-CM | POA: Diagnosis not present

## 2023-01-06 DIAGNOSIS — Z125 Encounter for screening for malignant neoplasm of prostate: Secondary | ICD-10-CM | POA: Diagnosis not present

## 2023-01-06 DIAGNOSIS — E782 Mixed hyperlipidemia: Secondary | ICD-10-CM | POA: Diagnosis not present

## 2023-01-13 DIAGNOSIS — N182 Chronic kidney disease, stage 2 (mild): Secondary | ICD-10-CM | POA: Diagnosis not present

## 2023-01-13 DIAGNOSIS — Z0000 Encounter for general adult medical examination without abnormal findings: Secondary | ICD-10-CM | POA: Diagnosis not present

## 2023-01-13 DIAGNOSIS — E782 Mixed hyperlipidemia: Secondary | ICD-10-CM | POA: Diagnosis not present

## 2023-01-13 DIAGNOSIS — E039 Hypothyroidism, unspecified: Secondary | ICD-10-CM | POA: Diagnosis not present

## 2023-01-30 ENCOUNTER — Ambulatory Visit: Payer: Federal, State, Local not specified - PPO | Admitting: Internal Medicine
